# Patient Record
Sex: Female | Born: 1965
Health system: Southern US, Community
[De-identification: ages and names within clinical notes are randomized; demographics above are authoritative.]

## PROBLEM LIST (undated history)

## (undated) DIAGNOSIS — T7840XA Allergy, unspecified, initial encounter: Secondary | ICD-10-CM

## (undated) DIAGNOSIS — M1990 Unspecified osteoarthritis, unspecified site: Secondary | ICD-10-CM

## (undated) DIAGNOSIS — IMO0001 Reserved for inherently not codable concepts without codable children: Secondary | ICD-10-CM

## (undated) DIAGNOSIS — IMO0002 Reserved for concepts with insufficient information to code with codable children: Secondary | ICD-10-CM

## (undated) DIAGNOSIS — K219 Gastro-esophageal reflux disease without esophagitis: Secondary | ICD-10-CM

## (undated) DIAGNOSIS — K5900 Constipation, unspecified: Secondary | ICD-10-CM

## (undated) DIAGNOSIS — Z9289 Personal history of other medical treatment: Secondary | ICD-10-CM

## (undated) DIAGNOSIS — K649 Unspecified hemorrhoids: Secondary | ICD-10-CM

## (undated) DIAGNOSIS — E039 Hypothyroidism, unspecified: Secondary | ICD-10-CM

## (undated) HISTORY — DX: Allergy, unspecified, initial encounter: T78.40XA

## (undated) HISTORY — DX: Constipation, unspecified: K59.00

## (undated) HISTORY — DX: Reserved for concepts with insufficient information to code with codable children: IMO0002

## (undated) HISTORY — DX: Personal history of other medical treatment: Z92.89

## (undated) HISTORY — DX: Unspecified osteoarthritis, unspecified site: M19.90

## (undated) HISTORY — PX: POLYPECTOMY: SHX149

## (undated) HISTORY — DX: Unspecified hemorrhoids: K64.9

## (undated) HISTORY — PX: DILATION AND CURETTAGE OF UTERUS: SHX78

## (undated) HISTORY — PX: COLONOSCOPY: SHX174

## (undated) HISTORY — PX: VAGINAL HYSTERECTOMY: SUR661

## (undated) HISTORY — PX: TUBAL LIGATION: SHX77

---

## 1992-05-13 HISTORY — PX: BREAST SURGERY: SHX581

## 1993-05-13 HISTORY — PX: BREAST BIOPSY: SHX20

## 2000-05-13 DIAGNOSIS — IMO0002 Reserved for concepts with insufficient information to code with codable children: Secondary | ICD-10-CM

## 2000-05-13 DIAGNOSIS — R87619 Unspecified abnormal cytological findings in specimens from cervix uteri: Secondary | ICD-10-CM

## 2000-05-13 HISTORY — PX: LEEP: SHX91

## 2000-05-13 HISTORY — DX: Unspecified abnormal cytological findings in specimens from cervix uteri: R87.619

## 2000-05-13 HISTORY — PX: COLPOSCOPY: SHX161

## 2000-05-13 HISTORY — DX: Reserved for concepts with insufficient information to code with codable children: IMO0002

## 2006-07-09 ENCOUNTER — Encounter (INDEPENDENT_AMBULATORY_CARE_PROVIDER_SITE_OTHER): Payer: Self-pay | Admitting: *Deleted

## 2006-07-09 ENCOUNTER — Ambulatory Visit: Payer: Self-pay | Admitting: Obstetrics & Gynecology

## 2007-06-11 ENCOUNTER — Ambulatory Visit: Payer: Self-pay | Admitting: Gynecology

## 2007-07-15 ENCOUNTER — Encounter (INDEPENDENT_AMBULATORY_CARE_PROVIDER_SITE_OTHER): Payer: Self-pay | Admitting: Gynecology

## 2007-07-15 ENCOUNTER — Ambulatory Visit: Payer: Self-pay | Admitting: Obstetrics & Gynecology

## 2008-03-29 ENCOUNTER — Ambulatory Visit: Payer: Self-pay | Admitting: Nurse Practitioner

## 2008-03-29 ENCOUNTER — Encounter: Payer: Self-pay | Admitting: Obstetrics & Gynecology

## 2008-03-29 LAB — CONVERTED CEMR LAB
ALT: <8 units/L (ref 0–35)
AST: 12 units/L (ref 0–37)
Albumin: 4.4 g/dL (ref 3.5–5.2)
Alkaline Phosphatase: 57 units/L (ref 39–117)
Chlamydia, DNA Probe: NEGATIVE
GC Probe Amp, Genital: NEGATIVE
MCHC: 32.4 g/dL (ref 30.0–36.0)
Potassium: 4.1 meq/L (ref 3.5–5.3)
RDW: 13.1 % (ref 11.5–15.5)
Sodium: 141 meq/L (ref 135–145)
Total Protein: 7 g/dL (ref 6.0–8.3)

## 2008-03-30 ENCOUNTER — Encounter: Payer: Self-pay | Admitting: Obstetrics & Gynecology

## 2008-03-30 LAB — CONVERTED CEMR LAB
Clue Cells Wet Prep HPF POC: NONE SEEN
Trich, Wet Prep: NONE SEEN

## 2008-04-05 ENCOUNTER — Ambulatory Visit: Payer: Self-pay | Admitting: Nurse Practitioner

## 2008-08-02 ENCOUNTER — Ambulatory Visit: Payer: Self-pay | Admitting: Nurse Practitioner

## 2008-08-02 ENCOUNTER — Encounter: Payer: Self-pay | Admitting: Obstetrics & Gynecology

## 2008-08-02 LAB — CONVERTED CEMR LAB: FSH: 12.9 milliintl units/mL

## 2008-08-23 ENCOUNTER — Ambulatory Visit: Payer: Self-pay | Admitting: Nurse Practitioner

## 2009-10-19 ENCOUNTER — Ambulatory Visit: Payer: Self-pay | Admitting: Obstetrics and Gynecology

## 2010-06-27 ENCOUNTER — Other Ambulatory Visit: Payer: 59

## 2010-06-27 DIAGNOSIS — K649 Unspecified hemorrhoids: Secondary | ICD-10-CM

## 2010-09-25 NOTE — Assessment & Plan Note (Signed)
NAMESHAQUERA, [name]               ACCOUNT NO.:  [id number]   MEDICAL RECORD NO.:  [id number]          PATIENT TYPE:  POB   LOCATION:  [facility] at [facility]         FACILITY:  [facility]   PHYSICIAN:  Johnella Moloney, MD        DATE OF BIRTH:  [birth year]   DATE OF SERVICE:                                  CLINIC NOTE   REASON FOR VISIT:  The patient comes to the office today to follow up on  her insomnia.   HISTORY:  Since her last office visit, we did get the results of her FSH  and TSH back which were both normal.  She now has had a very thorough  workup as far as her blood work has gone and everything has been normal.  She feels that the Ambien has worked very well for her.  She has been  able to sleep through the night.  She is taking half a tablet.  She has  been having some multiple physical complaints and she realizes now that  these are related in part to her anxiety level.  Her anxiety is related  to her job which has become very stressful after her co-worker quit and  they have not hired anybody.  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  She is not getting any breaks and they  are setting more restrictions on her at work.  Things are going well for  her at home and she denies having any difficulties there.   ASSESSMENT:  Anxiety.   PLAN:  We had a rather lengthy conversation over 30 minutes.  We talked  about her sleep which is certainly improving.  We also talked about her  job.  We talked about exercise and starting a yoga program something  that she has used in the past that has worked well for her.  She will  also start on Zoloft 50 mg one-half tablet for 1 week and then up to 1  tablet daily, number 30 with 3 refills.  The patient is very motivated,  cooperating and getting things back under control.  She is potentially  seeing a counselor and/or life coach.  She will follow up in 3 months.      [name], NP    ______________________________  Johnella Moloney, MD    LR/MEDQ  D:  08/23/2008  T:  08/23/2008  Job:  [id number]

## 2010-09-25 NOTE — Assessment & Plan Note (Signed)
[name], [name]               ACCOUNT NO.:  [id number]   MEDICAL RECORD NO.:  [id number]          PATIENT TYPE:  POB   LOCATION:  [facility] at [facility]         FACILITY:  [facility]   PHYSICIAN:  Johnella Moloney, MD        DATE OF BIRTH:  September 02, 1965   DATE OF SERVICE:                                  CLINIC NOTE   The patient comes to the office today for followup on her genital  herpes.  She has significantly improved.  She is taking the Valtrex and  the pain medication as prescribed as well as the sitz bath.  She has  agreed that she would like to continue and do suppression.  She will be  given a prescription for Valtrex 500 mg, #30 with 11 refills.  We did  have Dr. Silas Flood examine the cyst that we noticed in her vagina.  She did  not feel that this was anything that she would do any medical  intervention on at this time.  She will be returning in March for her  Pap smear.  She will follow up if she has any problems between now and  then.      [name], NP    ______________________________  Johnella Moloney, MD    LR/MEDQ  D:  04/05/2008  T:  04/06/2008  Job:  [id number]

## 2010-09-25 NOTE — Assessment & Plan Note (Signed)
[name], [name]               ACCOUNT NO.:  [id number]   MEDICAL RECORD NO.:  [id number]          PATIENT TYPE:  POB   LOCATION:  [facility] at [facility]         FACILITY:  [facility]   PHYSICIAN:  Argentina Donovan, MD        DATE OF BIRTH:  02-27-1966   DATE OF SERVICE:  03/29/2008                                  CLINIC NOTE   The patient comes to office today complaining of vaginal pain as well as  vaginal itching.  The patient states her symptoms started on Saturday  with overall body aches and a feeling of fatigue.  She also had some  left lower back pain.  On Sunday, she noticed that she had some vaginal  itching and vaginal pain and that her vaginal area was swollen.  She has  not had any change in her sexual partner.  She and her husband have been  married for the last 12 years in a faithful relationship.  She was on  doxycycline at this time for an eye infection.  She has since September  had 3-4 infections that have been upper respiratory infection, flu, and  eye infection.  She felt that she has overall been in bad health since  then.  She did try some over-the-counter yeast medicine that actually  made things significantly worse.   PHYSICAL EXAMINATION:  GENERAL:  Well-developed, well-nourished, 45-year-  old Caucasian female in no acute distress.  VITAL SIGNS:  Blood pressure is 131/82, pulse is 67, weight is 160, and  height is 5 feet 8 inches.  VAGINAL EXAM:  Externally, the patient does not have any obvious lesion,  but she does have watching redness and excoriation over her right labia  majora.  She also has some tenderness.  There is also some redness.  She  is not able to locate a specific area that is more painful than the  other.  Vaginally on the right wall, there is an approximately 6-7  inches what appears to be a cyst.  She does have some bloody odorous  discharge.  Bimanual exam, there is no cervical motion tenderness.  There is no adnexal tenderness.   ASSESSMENT  AND PLAN:  The patient appears to have initial herpes  outbreak.   PLAN:  We will check her herpes culture, wet prep, GC, and Chlamydia.  She will also have blood drawn for HIV, herpes type 1 and 2, acute  hepatitis, RPR, CBC with differential, and CMET.  She will also be given  Valtrex 1 g b.i.d. for 10 days.  She will be given Darvocet-N 100 to  take 1 or 2 p.o. q.6 h. p.r.n. #30.  She was given a note to be off work  until April 04, 2008.  We did have a lengthy discussion concerning  comfort measures including sitz baths and clean.  She will follow up on  April 05, 2008.  We will discuss her labs at that time.  She can call  the office in the meantime if she is experiencing any significant  problems.      [name], NP    ______________________________  Argentina Donovan, MD    LR/MEDQ  D:  03/29/2008  T:  03/30/2008  Job:  [id number]

## 2010-09-25 NOTE — Assessment & Plan Note (Signed)
[name], [name]               ACCOUNT NO.:  [id number]   MEDICAL RECORD NO.:  [id number]          PATIENT TYPE:  POB   LOCATION:  [facility]         FACILITY:  [facility]   PHYSICIAN:  [name], NP   DATE OF BIRTH:  22-Jun-1965   DATE OF SERVICE:  08/02/2008                                  CLINIC NOTE   The patient is in the office today for her yearly exam.  The patient was  diagnosed in this past year with herpes.  She had an Regional outbreak  back in November 17.  She did get Valtrex and is currently on  prophylaxis.  She is doing well with that.  She is also having some  complaints about her menstrual cycle changing.  Her menstrual period is  now currently only 1 day.  In very light, she does state that her mother  went through menopause in her 70s.  The patient is also complaining of  fatigue and difficulty sleeping.  She has no problem falling asleep, but  she does not stay asleep.  She may wake up after 1 hour or wake up in  middle of the night and be up for 2-3 hours.  She feels very restless.  The patient denies any psychological stressors.  She states that  everything is fine at home and at work.  The patient also is complaining  of weight gain.  She in fact has lost weight has lost 3 pounds since her  November visit.  She complains of no energy as being cold and having a  low libido.  Her sister has thyroid problems.  Her father has non-  insulin-dependent diabetes and she would like to have these things  checked.  At her visit in which she was diagnosed with herpes, we did do  chlamydia, gonorrhea, which were negative.  We did a CBC which was  negative, Chem 20 which was negative, hepatitis negative, HIV negative,  RPR negative.  The patient's last mammogram was July 18, 2006.   ALLERGIES:  The patient is allergic to Cipro and sulfa.   LMP first day of the last menstrual period is July 05, 2008.  Last  Pap smear was July 15, 2007.   PHYSICAL  EXAMINATION:  VITAL SIGNS:  Blood pressure 129/77, pulse is 65,  weight is 157, height is 5 feet 8 inches.  GENERAL:  Well-developed, well-nourished, 45 year old Caucasian female,  in no acute distress.  Emotions, the patient seems logical and well  organized.  NEUROLOGICALLY:  Muscles and nerves appear intact.  THYROID:  No nodules or enlargement.  CARDIAC:  Regular rate and rhythm.  LUNGS:  Clear bilaterally.  ABDOMEN:  Soft, nontender.  BREASTS:  Symmetrical.  No skin dimpling.  No mass.  PELVIC:  Externally, there are no lesions and discharges.  Internally,  the vaginal vault is filled with blood.  Cervix is closed without  lesions.  Bimanual exam, there is no cervical motion tenderness.  EXTREMITIES:  Warm and dry.   ASSESSMENT:  1. Yearly Pap and pelvic.  The patient is on the first day of her      menstrual cycle.  The patient has had  a tubal ligation for her      birth control.  2. Fatigue.  We will check an FSH and TSH.  3. Insomnia.  We did have a lengthy discussion concerning sleep      hygiene.  The patient will be given a prescription for Ambien 10 mg      1-1/2 tablet to 1 tablet nightly p.r.n. sleep #20 with 2 refills.  4. Breast health, the patient is scheduled today for a mammogram.  The      patient will follow up in 3 weeks for the insomnia.  She can call      the office sooner if need be.      [name], NP     LR/MEDQ  D:  08/02/2008  T:  08/03/2008  Job:  [id number]

## 2010-09-25 NOTE — Assessment & Plan Note (Signed)
[name], [name]               ACCOUNT NO.:  [id number]   MEDICAL RECORD NO.:  [id number]          PATIENT TYPE:  POB   LOCATION:  [facility] at [facility]         FACILITY:  [facility]   PHYSICIAN:  Argentina Donovan, MD        DATE OF BIRTH:  1965-06-27   DATE OF SERVICE:  10/19/2009                                  CLINIC NOTE   CHIEF COMPLAINT:  Annual physical.   HISTORY OF PRESENT ILLNESS:  The patient is a 45 year old white female  in good health who was only on Ambien on a regular basis each night  because of insomnia in the past.  The medication she was on in the past,  the Valtrex and the Zoloft have been stopped since her stress level at  the job is going down significantly and she seems to be in good health.  She exercises regularly and has a healthy lifestyle.   REVIEW OF SYSTEMS:  Essentially negative, exception of occasional  palpitation not at exercise, but at unexpected times.   ALLERGIES:  The patient is allergic to SULFA and CIPRO.   She has had 3 normal Pap smears within the last 3 years and is up-to-  date on her mammogram.   FAMILY HISTORY:  She has no family history of breast cancer.   PHYSICAL EXAMINATION:  VITAL SIGNS:  Blood pressure 124/79.  The  patient's weight is 158.  She is 5 feet 8 inches tall.  Pulse is 55 per  minute.  GENERAL:  Well-developed, well-nourished white female in no acute  distress.  HEENT:  Normocephalic and atraumatic within normal limits.  NECK:  Supple.  Thyroid is symmetrical.  No masses.  BACK:  Erect.  LUNGS:  Clear to auscultation and percussion.  HEART:  No murmur, but a significant third sound click over the area of  the mitral valve.  PMI at the fifth ICS and MCL.  ABDOMEN:  Soft, flat, nontender.  No masses.  No organomegaly.  PELVIC:  External genitalia is normal.  BUS within normal limits.  Vagina is clean and well rugated.  The cervix is clean and parous.  Uterus; anterior, normal size, shape, consistency, and the adnexa  is  normal.  RECTAL:  No masses.  EXTREMITIES:  Negative for edema and varicosities.  NEUROLOGIC:  DTRs within normal limits.   IMPRESSION:  Normal physical examination with exception of cardiac  click.  I am going to recommend that she gets a cardiac ultrasound,  possible mitral valve prolapse, virtually asymptomatic.  Rx for Ambien  30 with renewal p.r.n. for a year.           ______________________________  Argentina Donovan, MD     PR/MEDQ  D:  10/19/2009  T:  10/19/2009  Job:  [id number]

## 2011-04-15 ENCOUNTER — Encounter: Payer: Self-pay | Admitting: Obstetrics & Gynecology

## 2011-04-15 ENCOUNTER — Encounter: Payer: Self-pay | Admitting: Gynecology

## 2011-04-15 ENCOUNTER — Ambulatory Visit (INDEPENDENT_AMBULATORY_CARE_PROVIDER_SITE_OTHER): Payer: 59 | Admitting: Obstetrics & Gynecology

## 2011-04-15 VITALS — BP 116/71 | HR 67 | Ht 67.0 in | Wt 144.0 lb

## 2011-04-15 DIAGNOSIS — Z0000 Encounter for general adult medical examination without abnormal findings: Secondary | ICD-10-CM

## 2011-04-15 DIAGNOSIS — Z01419 Encounter for gynecological examination (general) (routine) without abnormal findings: Secondary | ICD-10-CM

## 2011-04-15 DIAGNOSIS — Z1272 Encounter for screening for malignant neoplasm of vagina: Secondary | ICD-10-CM

## 2011-04-15 MED ORDER — ZOLPIDEM TARTRATE 10 MG PO TABS: 10.0000 mg | ORAL_TABLET | Freq: Every evening | ORAL | Status: DC | PRN

## 2011-04-15 NOTE — Progress Notes (Signed)
Subjective:    [name] is a 45 y.o. female who presents for an annual exam. The patient has no complaints today. She would like a refill on her [place]. The patient is sexually active. GYN screening history: last pap: was normal. The patient wears seatbelts: yes. The patient participates in regular exercise: yes. (zumba and spin) Has the patient ever been transfused or tattooed?: no. The patient reports that there is not domestic violence in her life.   Menstrual History: OB History    Grav Para Term Preterm Abortions TAB SAB Ect Mult Living   3 2   1     2       Menarche age: 5 Patient's last menstrual period was 04/06/2011.    The following portions of the patient's history were reviewed and updated as appropriate: allergies, current medications, past family history, past medical history, past social history, past surgical history and problem list.  Review of Systems A comprehensive review of systems was negative. She has been monogamous since 1997 and married for about 7 years. She is the senior teller at a bank. Her libido has decreased in the last several years.   Objective:    BP 116/71  Pulse 67  Ht 5\' 7"  (1.702 m)  Wt 144 lb (65.318 kg)  BMI 22.55 kg/m2  LMP 04/06/2011  General Appearance:    Alert, cooperative, no distress, appears stated age  Head:    Normocephalic, without obvious abnormality, atraumatic  Eyes:    PERRL, conjunctiva/corneas clear, EOM's intact, fundi    benign, both eyes  Ears:    Normal TM's and external ear canals, both ears  Nose:   Nares normal, septum midline, mucosa normal, no drainage    or sinus tenderness  Throat:   Lips, mucosa, and tongue normal; teeth and gums normal  Neck:   Supple, symmetrical, trachea midline, no adenopathy;    thyroid:  no enlargement/tenderness/nodules; no carotid   bruit or JVD  Back:     Symmetric, no curvature, ROM normal, no CVA tenderness  Lungs:     Clear to auscultation bilaterally, respirations unlabored   Chest Wall:    No tenderness or deformity   Heart:    Regular rate and rhythm, S1 and S2 normal, no murmur, rub   or gallop  Breast Exam:    No tenderness, masses, or nipple abnormality  Abdomen:     Soft, non-tender, bowel sounds active all four quadrants,    no masses, no organomegaly  Genitalia:    Normal female without lesion, discharge or tenderness, NSSA, NT, no adnexal masses     Extremities:   Extremities normal, atraumatic, no cyanosis or edema  Pulses:   2+ and symmetric all extremities  Skin:   Skin color, texture, turgor normal, no rashes or lesions  Lymph nodes:   Cervical, supraclavicular, and axillary nodes normal  Neurologic:   CNII-XII intact, normal strength, sensation and reflexes    throughout  .    Assessment:    Healthy female exam.    Plan:     Await pap smear results. Mammogram.  She will come back fasting for labs.

## 2011-04-17 ENCOUNTER — Other Ambulatory Visit: Payer: Self-pay | Admitting: Nurse Practitioner

## 2011-04-19 LAB — PAP IG W/ RFLX HPV ASCU

## 2011-04-22 ENCOUNTER — Other Ambulatory Visit (INDEPENDENT_AMBULATORY_CARE_PROVIDER_SITE_OTHER): Payer: 59 | Admitting: *Deleted

## 2011-04-22 DIAGNOSIS — F411 Generalized anxiety disorder: Secondary | ICD-10-CM

## 2011-04-22 DIAGNOSIS — Z01419 Encounter for gynecological examination (general) (routine) without abnormal findings: Secondary | ICD-10-CM

## 2011-04-22 DIAGNOSIS — F419 Anxiety disorder, unspecified: Secondary | ICD-10-CM

## 2011-04-22 LAB — CBC WITH DIFFERENTIAL/PLATELET
Eosinophils Absolute: 0.2 10*3/uL (ref 0.0–0.7)
Eosinophils Relative: 3 % (ref 0–5)
HCT: 41 % (ref 36.0–46.0)
Lymphocytes Relative: 22 % (ref 12–46)
Lymphs Abs: 1.3 10*3/uL (ref 0.7–4.0)
MCH: 29.6 pg (ref 26.0–34.0)
MCV: 91.1 fL (ref 78.0–100.0)
Monocytes Absolute: 0.4 10*3/uL (ref 0.1–1.0)
Platelets: 202 10*3/uL (ref 150–400)
RBC: 4.5 MIL/uL (ref 3.87–5.11)
RDW: 13.6 % (ref 11.5–15.5)
WBC: 5.7 10*3/uL (ref 4.0–10.5)

## 2011-04-22 MED ORDER — ZOLPIDEM TARTRATE 10 MG PO TABS: 10.0000 mg | ORAL_TABLET | Freq: Every evening | ORAL | Status: DC | PRN

## 2011-04-23 LAB — COMPREHENSIVE METABOLIC PANEL
ALT: <8 U/L (ref 0–35)
Alkaline Phosphatase: 50 U/L (ref 39–117)
Creat: 0.84 mg/dL (ref 0.50–1.10)
Sodium: 138 mEq/L (ref 135–145)
Total Bilirubin: 0.5 mg/dL (ref 0.3–1.2)
Total Protein: 6.7 g/dL (ref 6.0–8.3)

## 2011-04-23 LAB — LIPID PANEL
HDL: 49 mg/dL (ref >39)
LDL Cholesterol: 101 mg/dL — ABNORMAL HIGH (ref 0–99)
Total CHOL/HDL Ratio: 3.4 Ratio
Triglycerides: 75 mg/dL (ref <150)
VLDL: 15 mg/dL (ref 0–40)

## 2011-05-14 HISTORY — PX: COLONOSCOPY W/ BIOPSIES: SHX1374

## 2011-06-04 ENCOUNTER — Ambulatory Visit (INDEPENDENT_AMBULATORY_CARE_PROVIDER_SITE_OTHER): Payer: 59 | Admitting: Obstetrics and Gynecology

## 2011-06-04 ENCOUNTER — Encounter: Payer: Self-pay | Admitting: Internal Medicine

## 2011-06-04 VITALS — BP 130/70 | HR 61 | Ht 67.0 in | Wt 144.0 lb

## 2011-06-04 DIAGNOSIS — K649 Unspecified hemorrhoids: Secondary | ICD-10-CM

## 2011-06-04 NOTE — Progress Notes (Signed)
Patient presents today as a follow-up appointment. Patient reports that for the past week she has noticed bloody stools. Patient states that she eats a high fiber diet but has irregular bowel movements- she can go every day for a week and then not go for a week. She does not feel that she is constipated and her bowel movements are not painful.  Physical exam: Small 0.5 cm hemorrhoid visualized at the 6 o/clock position. No fissures, No active bleeding, No blood on glove  A/P 46 yo G3P2012 with small hemorrhoid - Referral to GI provided - patient advised to continue high fiber diet and to use preparation H for relief until seen by GI

## 2011-06-04 NOTE — Patient Instructions (Signed)

## 2011-06-07 ENCOUNTER — Encounter: Payer: Self-pay | Admitting: Internal Medicine

## 2011-06-13 ENCOUNTER — Ambulatory Visit (INDEPENDENT_AMBULATORY_CARE_PROVIDER_SITE_OTHER): Payer: 59 | Admitting: Internal Medicine

## 2011-06-13 ENCOUNTER — Encounter: Payer: Self-pay | Admitting: Internal Medicine

## 2011-06-13 VITALS — BP 110/60 | HR 60 | Ht 67.0 in | Wt 145.0 lb

## 2011-06-13 DIAGNOSIS — K625 Hemorrhage of anus and rectum: Secondary | ICD-10-CM

## 2011-06-13 DIAGNOSIS — K589 Irritable bowel syndrome without diarrhea: Secondary | ICD-10-CM

## 2011-06-13 DIAGNOSIS — K648 Other hemorrhoids: Secondary | ICD-10-CM

## 2011-06-13 MED ORDER — HYDROCORTISONE ACE-PRAMOXINE 2.5-1 % RE CREA: TOPICAL_CREAM | Freq: Three times a day (TID) | RECTAL | Status: AC

## 2011-06-13 MED ORDER — ALIGN 4 MG PO CAPS: 1.0000 | ORAL_CAPSULE | Freq: Every day | ORAL | Status: AC

## 2011-06-13 MED ORDER — PEG-KCL-NACL-NASULF-NA ASC-C 100 G PO SOLR: 1.0000 | Freq: Once | ORAL | Status: DC

## 2011-06-13 NOTE — Progress Notes (Signed)
Subjective:    Patient ID: [name], female    DOB: April 04, 1966, 46 y.o.   MRN: [id number]  HPI [name] is a 46 yo female with little PMH who is seen in consultation at the request of Dr. Jolayne Panther for evaluation of rectal bleeding and internal hemorrhoids.  [name]  reports ongoing trouble with bright red blood per rectum. This started a few weeks ago, but recently has been present with every bowel movement. Overall this bleeding is painless, and always associated with bowel movement. She reports seeing bright red blood in the toilet water and on the toilet tissue. She has not felt an external hemorrhoid.  She reports having recently had a rectal exam Dr. Jolayne Panther which revealed an internal hemorrhoid.  She reports long-standing alternating diarrhea and constipation, though constipation seems to predominate. She reports she will have 2-3 days and around loose stools followed by one to 2 weeks of constipation. During her constipated. She reports having a bowel movement 2-3 times per week. These bowel movements can be large and hard, and require straining. She reports some associated lower abdominal cramping which is relieved with defecation. Also some associated nausea but no vomiting. She denies melena. No other upper GI complaints including no heartburn, dysphagia, odynophagia. No early satiety or weight loss. She is unable to relate her bowel habits to any specific dietary intake.  No fevers or chills. No night sweats.  Review of Systems Constitutional: Negative for fever, chills, night sweats, activity change, appetite change and unexpected weight change HEENT: Negative for sore throat, mouth sores and trouble swallowing. Eyes: Negative for visual disturbance Respiratory: Negative for cough, chest tightness and shortness of breath Cardiovascular: Negative for chest pain, palpitations and lower extremity swelling Gastrointestinal: See history of present illness Genitourinary: Negative for  dysuria and hematuria. Musculoskeletal: Positive for back pain, negative for arthralgias and myalgias Skin: Negative for rash or color change Neurological: Positive for occasional headaches, negative for weakness, numbness Hematological: Negative for adenopathy, negative for easy bruising/bleeding Psychiatric/behavioral: Negative for depressed mood, negative for anxiety  Past Medical History  Diagnosis Date  . Anxiety   . Abnormal Pap smear 2002    LGSIL  . Hemorrhoid    Past Surgical History  Procedure Date  . Tubal ligation   . Breast surgery 1994    left breast bx  . Leep 2002  . Dilation and curettage of uterus     TAB  . Colposcopy 2002    ABNORMAL PAP   Meds:  Ambien 5 mg each bedtime  Allergies  Allergen Reactions  . Ciprofloxacin   . Sulfa Antibiotics    Family History  Problem Relation Age of Onset  . Hypertension Father   . Diabetes Father   . Stroke Maternal Grandfather   . Diabetes Paternal Aunt   . Heart disease Paternal Aunt   . Diabetes Paternal Uncle   . Heart disease Paternal Uncle   -Neg CRC/IBD  Social History  . Marital Status: Married    Number of Children: 2   Occupational History  . bank teller    Social History Main Topics  . Smoking status: Never Smoker   . Smokeless tobacco: Never Used  . Alcohol Use: No  . Drug Use: No  . Sexually Active: Yes -- Female partner(s)      Objective:   Physical Exam BP 110/60  Pulse 60  Ht 5\' 7"  (1.702 m)  Wt 145 lb (65.772 kg)  BMI 22.71 kg/m2  LMP 05/28/2011 Constitutional:  Well-developed and well-nourished. No distress. HEENT: Normocephalic and atraumatic. Oropharynx is clear and moist. No oropharyngeal exudate. Conjunctivae are normal. Pupils are equal round and reactive to light. No scleral icterus. Neck: Neck supple. Trachea midline. Cardiovascular: Normal rate, regular rhythm and intact distal pulses. No M/R/G Pulmonary/chest: Effort normal and breath sounds normal. No wheezing, rales  or rhonchi. Abdominal: Soft, nontender, nondistended. Bowel sounds active throughout. There are no masses palpable. No hepatosplenomegaly. Extremities: no clubbing, cyanosis, or edema Lymphadenopathy: No cervical adenopathy noted. Neurological: Alert and oriented to person place and time. Skin: Skin is warm and dry. No rashes noted. Psychiatric: Normal mood and affect. Behavior is normal.  CBC    Component Value Date/Time   WBC 5.7 04/22/2011 1516   RBC 4.50 04/22/2011 1516   HGB 13.3 04/22/2011 1516   HCT 41.0 04/22/2011 1516   PLT 202 04/22/2011 1516   MCV 91.1 04/22/2011 1516   MCH 29.6 04/22/2011 1516   MCHC 32.4 04/22/2011 1516   RDW 13.6 04/22/2011 1516   LYMPHSABS 1.3 04/22/2011 1516   MONOABS 0.4 04/22/2011 1516   EOSABS 0.2 04/22/2011 1516   BASOSABS 0.1 04/22/2011 1516   CMP     Component Value Date/Time   NA 138 04/22/2011 1516   K 4.4 04/22/2011 1516   CL 105 04/22/2011 1516   CO2 22 04/22/2011 1516   GLUCOSE 73 04/22/2011 1516   BUN 9 04/22/2011 1516   CREATININE 0.84 04/22/2011 1516   CREATININE 0.84 03/29/2008 2304   CALCIUM 8.9 04/22/2011 1516   PROT 6.7 04/22/2011 1516   ALBUMIN 4.3 04/22/2011 1516   AST 17 04/22/2011 1516   ALT <8 04/22/2011 1516   ALKPHOS 50 04/22/2011 1516   BILITOT 0.5 04/22/2011 1516      Assessment & Plan:  46 yo female with little PMH who is seen in consultation at the request of Dr. Jolayne Panther for evaluation of rectal bleeding and internal hemorrhoids.  1. Rectal bleeding/internal hemorrhoids/IBS -- the patient is having ongoing rectal bleeding, recently CBC did not reveal anemia, which is reassuring. We have discussed this bleeding a day, and certainly this could be related to hemorrhoidal disease. However given her age and rectal bleeding, I recommended colonoscopy. She is agreeable to proceed. We discussed the risks and benefits of this test. I will give her a prescription for Analpram to be used for her internal hemorrhoid,  as this was recently seen on rectal exam by her GYN doctor.  Finally, her alternating bowel habits, which are long-standing over many years, are most consistent with irritable bowel syndrome.  We have discussed this today, and I will prescribe Align one capsule daily to try to regulate her digestion and bowel habits. We have discussed trying to avoid constipation, as this can worsen hemorrhoidal disease.  Further recommendations can be made after colonoscopy.

## 2011-06-13 NOTE — Patient Instructions (Signed)
You have been scheduled for a colonoscopy. Please follow written instructions given to you at your visit today.  Please pick up your prep kit at the pharmacy within the next 2-3 days.  We have sent the following medications to your pharmacy for you to pick up at your convenience: align; which you have been given samples of. Analpram, moviprep

## 2011-06-14 ENCOUNTER — Telehealth: Payer: Self-pay | Admitting: Gastroenterology

## 2011-06-14 NOTE — Telephone Encounter (Signed)
Left a voicemail for the Neriah to call me back after I received a call from her pharmacy saying she was there wanting to know if there was any other kind of prep for her to take for her Colonoscopy because Moviprep was going to cost her $50.00, after talking to Dr. Rhea Belton he said I could have her use suprep or prepopik.

## 2011-06-25 ENCOUNTER — Encounter: Payer: Self-pay | Admitting: Internal Medicine

## 2011-06-25 ENCOUNTER — Ambulatory Visit (AMBULATORY_SURGERY_CENTER): Payer: 59 | Admitting: Internal Medicine

## 2011-06-25 DIAGNOSIS — D126 Benign neoplasm of colon, unspecified: Secondary | ICD-10-CM

## 2011-06-25 DIAGNOSIS — K625 Hemorrhage of anus and rectum: Secondary | ICD-10-CM

## 2011-06-25 DIAGNOSIS — K648 Other hemorrhoids: Secondary | ICD-10-CM

## 2011-06-25 DIAGNOSIS — K589 Irritable bowel syndrome without diarrhea: Secondary | ICD-10-CM

## 2011-06-25 DIAGNOSIS — K649 Unspecified hemorrhoids: Secondary | ICD-10-CM

## 2011-06-25 MED ORDER — SODIUM CHLORIDE 0.9 % IV SOLN: 500.0000 mL | INTRAVENOUS | Status: DC

## 2011-06-25 NOTE — Patient Instructions (Addendum)
FOLLOW DISCHARGE INSTRUCTIONS (BLUE AND GREEN SHEETS).. Follow up with Dr. Rhea Belton in one month in office.

## 2011-06-25 NOTE — Progress Notes (Signed)
Patient did not experience any of the following events: a burn prior to discharge; a fall within the facility; wrong site/side/patient/procedure/implant event; or a hospital transfer or hospital admission upon discharge from the facility. (G8907) Patient did not have preoperative order for IV antibiotic SSI prophylaxis. (G8918)  

## 2011-06-25 NOTE — Op Note (Signed)
[facility] [address] [place], [place]  [address]  COLONOSCOPY PROCEDURE REPORT  PATIENT:  Claudia, Garcia  MR#:  [id number] BIRTHDATE:  02-11-1966, 46 yrs. old  GENDER:  female ENDOSCOPIST:  Carie Caddy. Lenah Messenger, MD REF. BY:  Dr. Jolayne Panther, MD PROCEDURE DATE:  06/25/2011 PROCEDURE:  Colonoscopy with snare polypectomy ASA CLASS:  Class I INDICATIONS:  rectal bleeding MEDICATIONS:   MAC sedation, administered by CRNA, propofol (Diprivan) 320 mg IV  DESCRIPTION OF PROCEDURE:   After the risks benefits and alternatives of the procedure were thoroughly explained, informed consent was obtained.  Digital rectal exam was performed and revealed no rectal masses and perianal skin tag.   The LB 180AL [id number] endoscope was introduced through the anus and advanced to the terminal ileum which was intubated for a short distance, without limitations.  The quality of the prep was good, using MoviPrep.  The instrument was then slowly withdrawn as the colon was fully examined. <<PROCEDUREIMAGES>>  FINDINGS:  A 7 mm flat and sessile polyp was found at the hepatic flexure. Polyps were snared without cautery. Retrieval was successful.  Mild diverticulosis was found in the sigmoid colon. Internal Hemorrhoids were found.   Retroflexed views in the rectum revealed no other findings other than those already described. The scope was then withdrawn from the cecum and the procedure completed.  COMPLICATIONS:  None ENDOSCOPIC IMPRESSION: 1) Sessile polyp at the hepatic flexure. Removed and sent to pathology. 2) Mild diverticulosis in the sigmoid colon 3) Internal hemorrhoids  RECOMMENDATIONS: 1) Hold aspirin, aspirin products, and anti-inflammatory medication for 1 week. 2) Await pathology results 3) High fiber diet. 4) If the polyp removed today is proven to be adenomatous (pre-cancerous) polyps, you will need a repeat colonoscopy in 5 years. Otherwise you should continue to follow  colorectal cancer screening guidelines for "routine risk" patients with colonoscopy in 10 years. 5) You will receive a letter within 1-2 weeks with the results of your biopsy as well as final recommendations. Please call my office if you have not received a letter after 3 weeks.  Carie Caddy. Kaladin Noseworthy, MD  CC:  The Patient Dr. Jolayne Panther  n. eSIGNED:   Carie Caddy. Ednah Hammock at 06/25/2011 02:13 PM  Hyman Bower, [phone]

## 2011-06-26 ENCOUNTER — Telehealth: Payer: Self-pay | Admitting: *Deleted

## 2011-06-26 NOTE — Telephone Encounter (Signed)
Left message to call as needed. 

## 2011-07-02 ENCOUNTER — Encounter: Payer: Self-pay | Admitting: Internal Medicine

## 2012-04-21 ENCOUNTER — Ambulatory Visit (INDEPENDENT_AMBULATORY_CARE_PROVIDER_SITE_OTHER): Payer: 59 | Admitting: Obstetrics & Gynecology

## 2012-04-21 ENCOUNTER — Encounter: Payer: Self-pay | Admitting: Obstetrics & Gynecology

## 2012-04-21 VITALS — BP 110/69 | HR 63 | Ht 67.0 in | Wt 153.0 lb

## 2012-04-21 DIAGNOSIS — Z01419 Encounter for gynecological examination (general) (routine) without abnormal findings: Secondary | ICD-10-CM

## 2012-04-21 DIAGNOSIS — Z124 Encounter for screening for malignant neoplasm of cervix: Secondary | ICD-10-CM

## 2012-04-21 DIAGNOSIS — G4700 Insomnia, unspecified: Secondary | ICD-10-CM

## 2012-04-21 DIAGNOSIS — Z1151 Encounter for screening for human papillomavirus (HPV): Secondary | ICD-10-CM

## 2012-04-21 DIAGNOSIS — N898 Other specified noninflammatory disorders of vagina: Secondary | ICD-10-CM

## 2012-04-21 MED ORDER — ZOLPIDEM TARTRATE 5 MG PO TABS: 5.0000 mg | ORAL_TABLET | Freq: Every evening | ORAL | Status: DC | PRN

## 2012-04-21 NOTE — Patient Instructions (Signed)
Preventive Care for Adults, Female A healthy lifestyle and preventive care can promote health and wellness. Preventive health guidelines for women include the following key practices.  A routine yearly physical is a good way to check with your caregiver about your health and preventive screening. It is a chance to share any concerns and updates on your health, and to receive a thorough exam.  Visit your dentist for a routine exam and preventive care every 6 months. Brush your teeth twice a day and floss once a day. Good oral hygiene prevents tooth decay and gum disease.  The frequency of eye exams is based on your age, health, family medical history, use of contact lenses, and other factors. Follow your caregiver's recommendations for frequency of eye exams.  Eat a healthy diet. Foods like vegetables, fruits, whole grains, low-fat dairy products, and lean protein foods contain the nutrients you need without too many calories. Decrease your intake of foods high in solid fats, added sugars, and salt. Eat the right amount of calories for you.Get information about a proper diet from your caregiver, if necessary.  Regular physical exercise is one of the most important things you can do for your health. Most adults should get at least 150 minutes of moderate-intensity exercise (any activity that increases your heart rate and causes you to sweat) each week. In addition, most adults need muscle-strengthening exercises on 2 or more days a week.  Maintain a healthy weight. The body mass index (BMI) is a screening tool to identify possible weight problems. It provides an estimate of body fat based on height and weight. Your caregiver can help determine your BMI, and can help you achieve or maintain a healthy weight.For adults 20 years and older:  A BMI below 18.5 is considered underweight.  A BMI of 18.5 to 24.9 is normal.  A BMI of 25 to 29.9 is considered overweight.  A BMI of 30 and above is  considered obese.  Maintain normal blood lipids and cholesterol levels by exercising and minimizing your intake of saturated fat. Eat a balanced diet with plenty of fruit and vegetables. Blood tests for lipids and cholesterol should begin at age 41 and be repeated every 5 years. If your lipid or cholesterol levels are high, you are over 50, or you are at high risk for heart disease, you may need your cholesterol levels checked more frequently.Ongoing high lipid and cholesterol levels should be treated with medicines if diet and exercise are not effective.  If you smoke, find out from your caregiver how to quit. If you do not use tobacco, do not start.  If you are pregnant, do not drink alcohol. If you are breastfeeding, be very cautious about drinking alcohol. If you are not pregnant and choose to drink alcohol, do not exceed 1 drink per day. One drink is considered to be 12 ounces (355 mL) of beer, 5 ounces (148 mL) of wine, or 1.5 ounces (44 mL) of liquor.  Avoid use of street drugs. Do not share needles with anyone. Ask for help if you need support or instructions about stopping the use of drugs.  High blood pressure causes heart disease and increases the risk of stroke. Your blood pressure should be checked at least every 1 to 2 years. Ongoing high blood pressure should be treated with medicines if weight loss and exercise are not effective.  If you are 65 to 47 years old, ask your caregiver if you should take aspirin to prevent strokes.  Diabetes  screening involves taking a blood sample to check your fasting blood sugar level. This should be done once every 3 years, after age 45, if you are within normal weight and without risk factors for diabetes. Testing should be considered at a younger age or be carried out more frequently if you are overweight and have at least 1 risk factor for diabetes.  Breast cancer screening is essential preventive care for women. You should practice "breast  self-awareness." This means understanding the normal appearance and feel of your breasts and may include breast self-examination. Any changes detected, no matter how small, should be reported to a caregiver. Women in their 20s and 30s should have a clinical breast exam (CBE) by a caregiver as part of a regular health exam every 1 to 3 years. After age 40, women should have a CBE every year. Starting at age 40, women should consider having a mammography (breast X-ray test) every year. Women who have a family history of breast cancer should talk to their caregiver about genetic screening. Women at a high risk of breast cancer should talk to their caregivers about having magnetic resonance imaging (MRI) and a mammography every year.  The Pap test is a screening test for cervical cancer. A Pap test can show cell changes on the cervix that might become cervical cancer if left untreated. A Pap test is a procedure in which cells are obtained and examined from the lower end of the uterus (cervix).  Women should have a Pap test starting at age 21.  Between ages 21 and 29, Pap tests should be repeated every 2 years.  Beginning at age 30, you should have a Pap test every 3 years as long as the past 3 Pap tests have been normal.  Some women have medical problems that increase the chance of getting cervical cancer. Talk to your caregiver about these problems. It is especially important to talk to your caregiver if a new problem develops soon after your last Pap test. In these cases, your caregiver may recommend more frequent screening and Pap tests.  The above recommendations are the same for women who have or have not gotten the vaccine for human papillomavirus (HPV).  If you had a hysterectomy for a problem that was not cancer or a condition that could lead to cancer, then you no longer need Pap tests. Even if you no longer need a Pap test, a regular exam is a good idea to make sure no other problems are  starting.  If you are between ages 65 and 70, and you have had normal Pap tests going back 10 years, you no longer need Pap tests. Even if you no longer need a Pap test, a regular exam is a good idea to make sure no other problems are starting.  If you have had past treatment for cervical cancer or a condition that could lead to cancer, you need Pap tests and screening for cancer for at least 20 years after your treatment.  If Pap tests have been discontinued, risk factors (such as a new sexual partner) need to be reassessed to determine if screening should be resumed.  The HPV test is an additional test that may be used for cervical cancer screening. The HPV test looks for the virus that can cause the cell changes on the cervix. The cells collected during the Pap test can be tested for HPV. The HPV test could be used to screen women aged 30 years and older, and should   be used in women of any age who have unclear Pap test results. After the age of 30, women should have HPV testing at the same frequency as a Pap test.  Colorectal cancer can be detected and often prevented. Most routine colorectal cancer screening begins at the age of 50 and continues through age 75. However, your caregiver may recommend screening at an earlier age if you have risk factors for colon cancer. On a yearly basis, your caregiver may provide home test kits to check for hidden blood in the stool. Use of a small camera at the end of a tube, to directly examine the colon (sigmoidoscopy or colonoscopy), can detect the earliest forms of colorectal cancer. Talk to your caregiver about this at age 50, when routine screening begins. Direct examination of the colon should be repeated every 5 to 10 years through age 75, unless early forms of pre-cancerous polyps or small growths are found.  Hepatitis C blood testing is recommended for all people born from 1945 through 1965 and any individual with known risks for hepatitis C.  Practice  safe sex. Use condoms and avoid high-risk sexual practices to reduce the spread of sexually transmitted infections (STIs). STIs include gonorrhea, chlamydia, syphilis, trichomonas, herpes, HPV, and human immunodeficiency virus (HIV). Herpes, HIV, and HPV are viral illnesses that have no cure. They can result in disability, cancer, and death. Sexually active women aged 25 and younger should be checked for chlamydia. Older women with new or multiple partners should also be tested for chlamydia. Testing for other STIs is recommended if you are sexually active and at increased risk.  Osteoporosis is a disease in which the bones lose minerals and strength with aging. This can result in serious bone fractures. The risk of osteoporosis can be identified using a bone density scan. Women ages 65 and over and women at risk for fractures or osteoporosis should discuss screening with their caregivers. Ask your caregiver whether you should take a calcium supplement or vitamin D to reduce the rate of osteoporosis.  Menopause can be associated with physical symptoms and risks. Hormone replacement therapy is available to decrease symptoms and risks. You should talk to your caregiver about whether hormone replacement therapy is right for you.  Use sunscreen with sun protection factor (SPF) of 30 or more. Apply sunscreen liberally and repeatedly throughout the day. You should seek shade when your shadow is shorter than you. Protect yourself by wearing long sleeves, pants, a wide-brimmed hat, and sunglasses year round, whenever you are outdoors.  Once a month, do a whole body skin exam, using a mirror to look at the skin on your back. Notify your caregiver of new moles, moles that have irregular borders, moles that are larger than a pencil eraser, or moles that have changed in shape or color.  Stay current with required immunizations.  Influenza. You need a dose every fall (or winter). The composition of the flu vaccine  changes each year, so being vaccinated once is not enough.  Pneumococcal polysaccharide. You need 1 to 2 doses if you smoke cigarettes or if you have certain chronic medical conditions. You need 1 dose at age 65 (or older) if you have never been vaccinated.  Tetanus, diphtheria, pertussis (Tdap, Td). Get 1 dose of Tdap vaccine if you are younger than age 65, are over 65 and have contact with an infant, are a healthcare worker, are pregnant, or simply want to be protected from whooping cough. After that, you need a Td   booster dose every 10 years. Consult your caregiver if you have not had at least 3 tetanus and diphtheria-containing shots sometime in your life or have a deep or dirty wound.  HPV. You need this vaccine if you are a woman age 26 or younger. The vaccine is given in 3 doses over 6 months.  Measles, mumps, rubella (MMR). You need at least 1 dose of MMR if you were born in 1957 or later. You may also need a second dose.  Meningococcal. If you are age 19 to 21 and a first-year college student living in a residence hall, or have one of several medical conditions, you need to get vaccinated against meningococcal disease. You may also need additional booster doses.  Zoster (shingles). If you are age 60 or older, you should get this vaccine.  Varicella (chickenpox). If you have never had chickenpox or you were vaccinated but received only 1 dose, talk to your caregiver to find out if you need this vaccine.  Hepatitis A. You need this vaccine if you have a specific risk factor for hepatitis A virus infection or you simply wish to be protected from this disease. The vaccine is usually given as 2 doses, 6 to 18 months apart.  Hepatitis B. You need this vaccine if you have a specific risk factor for hepatitis B virus infection or you simply wish to be protected from this disease. The vaccine is given in 3 doses, usually over 6 months. Preventive Services / Frequency Ages 19 to 39  Blood  pressure check.** / Every 1 to 2 years.  Lipid and cholesterol check.** / Every 5 years beginning at age 20.  Clinical breast exam.** / Every 3 years for women in their 20s and 30s.  Pap test.** / Every 2 years from ages 21 through 29. Every 3 years starting at age 30 through age 65 or 70 with a history of 3 consecutive normal Pap tests.  HPV screening.** / Every 3 years from ages 30 through ages 65 to 70 with a history of 3 consecutive normal Pap tests.  Hepatitis C blood test.** / For any individual with known risks for hepatitis C.  Skin self-exam. / Monthly.  Influenza immunization.** / Every year.  Pneumococcal polysaccharide immunization.** / 1 to 2 doses if you smoke cigarettes or if you have certain chronic medical conditions.  Tetanus, diphtheria, pertussis (Tdap, Td) immunization. / A one-time dose of Tdap vaccine. After that, you need a Td booster dose every 10 years.  HPV immunization. / 3 doses over 6 months, if you are 26 and younger.  Measles, mumps, rubella (MMR) immunization. / You need at least 1 dose of MMR if you were born in 1957 or later. You may also need a second dose.  Meningococcal immunization. / 1 dose if you are age 19 to 21 and a first-year college student living in a residence hall, or have one of several medical conditions, you need to get vaccinated against meningococcal disease. You may also need additional booster doses.  Varicella immunization.** / Consult your caregiver.  Hepatitis A immunization.** / Consult your caregiver. 2 doses, 6 to 18 months apart.  Hepatitis B immunization.** / Consult your caregiver. 3 doses usually over 6 months. Ages 40 to 64  Blood pressure check.** / Every 1 to 2 years.  Lipid and cholesterol check.** / Every 5 years beginning at age 20.  Clinical breast exam.** / Every year after age 40.  Mammogram.** / Every year beginning at age 40   and continuing for as long as you are in good health. Consult with your  caregiver.  Pap test.** / Every 3 years starting at age 30 through age 65 or 70 with a history of 3 consecutive normal Pap tests.  HPV screening.** / Every 3 years from ages 30 through ages 65 to 70 with a history of 3 consecutive normal Pap tests.  Fecal occult blood test (FOBT) of stool. / Every year beginning at age 50 and continuing until age 75. You may not need to do this test if you get a colonoscopy every 10 years.  Flexible sigmoidoscopy or colonoscopy.** / Every 5 years for a flexible sigmoidoscopy or every 10 years for a colonoscopy beginning at age 50 and continuing until age 75.  Hepatitis C blood test.** / For all people born from 1945 through 1965 and any individual with known risks for hepatitis C.  Skin self-exam. / Monthly.  Influenza immunization.** / Every year.  Pneumococcal polysaccharide immunization.** / 1 to 2 doses if you smoke cigarettes or if you have certain chronic medical conditions.  Tetanus, diphtheria, pertussis (Tdap, Td) immunization.** / A one-time dose of Tdap vaccine. After that, you need a Td booster dose every 10 years.  Measles, mumps, rubella (MMR) immunization. / You need at least 1 dose of MMR if you were born in 1957 or later. You may also need a second dose.  Varicella immunization.** / Consult your caregiver.  Meningococcal immunization.** / Consult your caregiver.  Hepatitis A immunization.** / Consult your caregiver. 2 doses, 6 to 18 months apart.  Hepatitis B immunization.** / Consult your caregiver. 3 doses, usually over 6 months. Ages 65 and over  Blood pressure check.** / Every 1 to 2 years.  Lipid and cholesterol check.** / Every 5 years beginning at age 20.  Clinical breast exam.** / Every year after age 40.  Mammogram.** / Every year beginning at age 40 and continuing for as long as you are in good health. Consult with your caregiver.  Pap test.** / Every 3 years starting at age 30 through age 65 or 70 with a 3  consecutive normal Pap tests. Testing can be stopped between 65 and 70 with 3 consecutive normal Pap tests and no abnormal Pap or HPV tests in the past 10 years.  HPV screening.** / Every 3 years from ages 30 through ages 65 or 70 with a history of 3 consecutive normal Pap tests. Testing can be stopped between 65 and 70 with 3 consecutive normal Pap tests and no abnormal Pap or HPV tests in the past 10 years.  Fecal occult blood test (FOBT) of stool. / Every year beginning at age 50 and continuing until age 75. You may not need to do this test if you get a colonoscopy every 10 years.  Flexible sigmoidoscopy or colonoscopy.** / Every 5 years for a flexible sigmoidoscopy or every 10 years for a colonoscopy beginning at age 50 and continuing until age 75.  Hepatitis C blood test.** / For all people born from 1945 through 1965 and any individual with known risks for hepatitis C.  Osteoporosis screening.** / A one-time screening for women ages 65 and over and women at risk for fractures or osteoporosis.  Skin self-exam. / Monthly.  Influenza immunization.** / Every year.  Pneumococcal polysaccharide immunization.** / 1 dose at age 65 (or older) if you have never been vaccinated.  Tetanus, diphtheria, pertussis (Tdap, Td) immunization. / A one-time dose of Tdap vaccine if you are over   65 and have contact with an infant, are a Research scientist (physical sciences), or simply want to be protected from whooping cough. After that, you need a Td booster dose every 10 years.  Varicella immunization.** / Consult your caregiver.  Meningococcal immunization.** / Consult your caregiver.  Hepatitis A immunization.** / Consult your caregiver. 2 doses, 6 to 18 months apart.  Hepatitis B immunization.** / Check with your caregiver. 3 doses, usually over 6 months. ** Family history and personal history of risk and conditions may change your caregiver's recommendations. Document Released: 06/25/2001 Document Revised: 07/22/2011  Document Reviewed: 09/24/2010 [facility] Patient Information 2013 [place], [place].  Thank you for enrolling in MyChart. Please follow the instructions below to securely access your online medical record. MyChart allows you to send messages to your doctor, view your test results, manage appointments, and more.   How Do I Sign Up? 1. In your Internet browser, go to [employer] and enter [email or web address]. 2. Click on the Sign Up Now link in the Sign In box. You will see the New Member Sign Up page. 3. Enter your MyChart Access Code exactly as it appears below. You will not need to use this code after you've completed the sign-up process. If you do not sign up before the expiration date, you must request a new code. MyChart Access Code: [email or web address] Expires: 05/21/2012  8:49 AM  4. Enter your Social Security Number ([id number]) and Date of Birth (mm/dd/yyyy) as indicated and click Submit. You will be taken to the next sign-up page. 5. Create a MyChart ID. This will be your MyChart login ID and cannot be changed, so think of one that is secure and easy to remember. 6. Create a MyChart password. You can change your password at any time. 7. Enter your Password Reset Question and Answer. This can be used at a later time if you forget your password.  8. Enter your e-mail address. You will receive e-mail notification when new information is available in MyChart. 9. Click Sign Up. You can now view your medical record.   Additional Information Remember, MyChart is NOT to be used for urgent needs. For medical emergencies, dial 911.

## 2012-04-21 NOTE — Progress Notes (Signed)
  Subjective:     [name] is a 46 y.o. female and is here for a comprehensive gynecologic physical exam. The patient reports that her husband felt something in her vagina during intercourse; on the right side. She wants to make sure everything is okay. Denies dyspareunia, abnormal bleeding, abnormal discharge or other GYN concerns. She is also requesting a refill of her Ambien.  History   Social History  . Marital Status: Married    Spouse Name: N/A    Number of Children: 2  . Years of Education: N/A   Occupational History  . bank teller    Social History Main Topics  . Smoking status: Never Smoker   . Smokeless tobacco: Never Used  . Alcohol Use: No  . Drug Use: No  . Sexually Active: Yes -- Female partner(s)   Other Topics Concern  . Not on file   Social History Narrative  . No narrative on file   Health Maintenance  Topic Date Due  . Pap Smear  06/04/1982  . Tetanus/tdap  06/05/1983  . Influenza Vaccine  01/12/2012    The following portions of the patient's history were reviewed and updated as appropriate: allergies, current medications, past family history, past medical history, past social history, past surgical history and problem list.  Review of Systems Pertinent items are noted in HPI.   Objective:   BP 110/69  Pulse 63  Ht 5\' 7"  (1.702 m)  Wt 153 lb (69.4 kg)  BMI 23.96 kg/m2  LMP 04/14/2012 GENERAL: Well-developed, well-nourished female in no acute distress.  HEENT: Normocephalic, atraumatic. Sclerae anicteric.  NECK: Supple. Normal thyroid.  LUNGS: Clear to auscultation bilaterally.  HEART: Regular rate and rhythm. BREASTS: Symmetric in size. No masses, skin changes, nipple drainage, or lymphadenopathy. ABDOMEN: Soft, nontender, nondistended. No organomegaly. PELVIC: Normal external female genitalia. Vagina is pink and rugated. 1 cm round, nontender, nonerythematous vaginal inclusion cyst noted on the right vaginal wall, about 4 cm from the  introitus..  Normal discharge. Normal cervix contour. Pap smear obtained. Uterus is normal in size. No adnexal mass or tenderness.  EXTREMITIES: No cyanosis, clubbing, or edema, 2+ distal pulses.   Assessment:    Healthy female exam.  Vaginal inclusion cyst     Plan:    Pap done, will follow up results and manage accordingly. Mammogram will be scheduled Reassured about the benign nature of the inclusion cyst.  No intervention necessary unless any concerning symptoms arise (gets larger in size, pain, abnormal bleeding etc). Routine preventative health maintenance measures emphasized

## 2012-04-22 MED ORDER — ZOLPIDEM TARTRATE 5 MG PO TABS: 5.0000 mg | ORAL_TABLET | Freq: Every evening | ORAL | Status: DC | PRN

## 2012-04-22 NOTE — Addendum Note (Signed)
Addended by: Barbara Cower on: 04/22/2012 04:02 PM   Modules accepted: Orders

## 2014-03-14 ENCOUNTER — Encounter: Payer: Self-pay | Admitting: Obstetrics & Gynecology

## 2014-05-02 ENCOUNTER — Ambulatory Visit: Payer: Self-pay | Admitting: Family Medicine

## 2014-08-09 ENCOUNTER — Ambulatory Visit: Payer: Self-pay | Admitting: Family Medicine

## 2014-08-10 ENCOUNTER — Ambulatory Visit (INDEPENDENT_AMBULATORY_CARE_PROVIDER_SITE_OTHER): Payer: No Typology Code available for payment source | Admitting: Advanced Practice Midwife

## 2014-08-10 ENCOUNTER — Encounter: Payer: Self-pay | Admitting: Advanced Practice Midwife

## 2014-08-10 VITALS — BP 134/84 | HR 63 | Resp 16 | Wt 163.0 lb

## 2014-08-10 DIAGNOSIS — Z01419 Encounter for gynecological examination (general) (routine) without abnormal findings: Secondary | ICD-10-CM

## 2014-08-10 DIAGNOSIS — Z124 Encounter for screening for malignant neoplasm of cervix: Secondary | ICD-10-CM | POA: Diagnosis not present

## 2014-08-10 DIAGNOSIS — Z0000 Encounter for general adult medical examination without abnormal findings: Secondary | ICD-10-CM

## 2014-08-10 NOTE — Progress Notes (Signed)
Subjective:    [name] is a 49 y.o. female who presents for an annual exam. The patient has no complaints today. She does report irregular menses in last few months, usually light and regular before this year. The patient is sexually active. GYN screening history: last pap: was normal. The patient wears seatbelts: yes. The patient participates in regular exercise: yes. The patient reports that there is not domestic violence in her life.  She does not have a primary care provider, as her previous provider retired.    Menstrual History: OB History    Gravida Para Term Preterm AB TAB SAB Ectopic Multiple Living   3 2   1     2        Patient's last menstrual period was 05/27/2014 (approximate).    The following portions of the patient's history were reviewed and updated as appropriate: allergies, current medications, past family history, past medical history, past social history, past surgical history and problem list.  Review of Systems A comprehensive review of systems was negative except for: Musculoskeletal: positive for left knee pain and plantar fasciitis    Objective:     Physical Examination: General appearance - alert, well appearing, and in no distress, oriented to person, place, and time and acyanotic, in no respiratory distress Neck - supple, no significant adenopathy, thyroid exam: thyroid is normal in size without nodules or tenderness Chest - clear to auscultation, no wheezes, rales or rhonchi, symmetric air entry Heart - normal rate, regular rhythm, normal S1, S2, no murmurs, rubs, clicks or gallops Abdomen - soft, nontender, nondistended, no masses or organomegaly Breasts - breasts appear normal, no suspicious masses, no skin or nipple changes or axillary nodes Pelvic - normal external genitalia, vulva, vagina, cervix, uterus and adnexa, scant bleeding with pap Neurological - alert, oriented, normal speech, no focal findings or movement disorder noted Extremities -  peripheral pulses normal, no pedal edema, no clubbing or cyanosis Skin - normal coloration and turgor, no rashes, no suspicious skin lesions noted .    Assessment:    Healthy female exam.    Plan:     All questions answered. Await pap smear results. Blood tests: CBC, CMP, Vit D, lipid panel. Mammogram. Follow up in 1 year for well-woman exam, recommend primary care provider for routine care

## 2014-08-10 NOTE — Addendum Note (Signed)
Addended by: Tarry Kos on: 08/10/2014 05:15 PM   Modules accepted: Orders

## 2014-08-11 ENCOUNTER — Other Ambulatory Visit: Payer: No Typology Code available for payment source

## 2014-08-15 LAB — CYTOLOGY - PAP

## 2014-08-31 ENCOUNTER — Ambulatory Visit
Admit: 2014-08-31 | Disposition: A | Discharge status: Home / Self Care | Payer: Self-pay | Attending: Family Medicine | Admitting: Family Medicine

## 2014-09-02 ENCOUNTER — Ambulatory Visit
Admit: 2014-09-02 | Disposition: A | Discharge status: Home / Self Care | Payer: Self-pay | Attending: Family Medicine | Admitting: Family Medicine

## 2014-09-06 ENCOUNTER — Ambulatory Visit
Admit: 2014-09-06 | Disposition: A | Discharge status: Home / Self Care | Payer: Self-pay | Attending: Family Medicine | Admitting: Family Medicine

## 2014-09-07 ENCOUNTER — Ambulatory Visit
Admit: 2014-09-07 | Disposition: A | Discharge status: Home / Self Care | Payer: Self-pay | Attending: Family Medicine | Admitting: Family Medicine

## 2014-09-07 LAB — CBC WITH DIFFERENTIAL/PLATELET
BASOS PCT: 1.4 %
Basophil #: 0.1 10*3/uL (ref 0.0–0.1)
EOS PCT: 8.1 %
Eosinophil #: 0.6 10*3/uL (ref 0.0–0.7)
HCT: 41.4 % (ref 35.0–47.0)
HGB: 14 g/dL (ref 12.0–16.0)
LYMPHS ABS: 1.6 10*3/uL (ref 1.0–3.6)
Lymphocyte %: 20.7 %
MCH: 31.1 pg (ref 26.0–34.0)
MCHC: 33.8 g/dL (ref 32.0–36.0)
MCV: 92 fL (ref 80–100)
MONOS PCT: 6.5 %
Monocyte #: 0.5 x10 3/mm (ref 0.2–0.9)
NEUTROS ABS: 4.8 10*3/uL (ref 1.4–6.5)
Neutrophil %: 63.3 %
PLATELETS: 185 10*3/uL (ref 150–440)
RBC: 4.49 10*6/uL (ref 3.80–5.20)
RDW: 13.3 % (ref 11.5–14.5)
WBC: 7.6 10*3/uL (ref 3.6–11.0)

## 2014-09-12 ENCOUNTER — Encounter: Payer: No Typology Code available for payment source | Admitting: Surgery

## 2015-01-25 ENCOUNTER — Ambulatory Visit (INDEPENDENT_AMBULATORY_CARE_PROVIDER_SITE_OTHER): Payer: No Typology Code available for payment source | Admitting: Family Medicine

## 2015-01-25 ENCOUNTER — Encounter: Payer: Self-pay | Admitting: Family Medicine

## 2015-01-25 VITALS — BP 100/70 | HR 68 | Ht 67.0 in | Wt 163.0 lb

## 2015-01-25 DIAGNOSIS — K589 Irritable bowel syndrome without diarrhea: Secondary | ICD-10-CM

## 2015-01-25 DIAGNOSIS — E039 Hypothyroidism, unspecified: Secondary | ICD-10-CM | POA: Diagnosis not present

## 2015-01-25 DIAGNOSIS — Z818 Family history of other mental and behavioral disorders: Secondary | ICD-10-CM

## 2015-01-25 DIAGNOSIS — Z8249 Family history of ischemic heart disease and other diseases of the circulatory system: Secondary | ICD-10-CM | POA: Insufficient documentation

## 2015-01-25 DIAGNOSIS — Z780 Asymptomatic menopausal state: Secondary | ICD-10-CM | POA: Diagnosis not present

## 2015-01-25 DIAGNOSIS — Z820 Family history of epilepsy and other diseases of the nervous system: Secondary | ICD-10-CM

## 2015-01-25 DIAGNOSIS — J309 Allergic rhinitis, unspecified: Secondary | ICD-10-CM

## 2015-01-25 DIAGNOSIS — Z833 Family history of diabetes mellitus: Secondary | ICD-10-CM | POA: Diagnosis not present

## 2015-01-25 DIAGNOSIS — L259 Unspecified contact dermatitis, unspecified cause: Secondary | ICD-10-CM

## 2015-01-25 DIAGNOSIS — Z23 Encounter for immunization: Secondary | ICD-10-CM

## 2015-01-25 DIAGNOSIS — R011 Cardiac murmur, unspecified: Secondary | ICD-10-CM | POA: Diagnosis not present

## 2015-01-25 MED ORDER — PREDNISONE 20 MG PO TABS: 20.0000 mg | ORAL_TABLET | Freq: Two times a day (BID) | ORAL | Status: DC

## 2015-01-25 NOTE — Progress Notes (Signed)
Date:  01/25/2015   Name:  [name]   DOB:  [birth year]   MRN:  [id number]  PCP:  Adline Potter, MD    Chief Complaint: Rash   History of Present Illness:  This is a 49 y.o. female with rash x 3d after pulling weeds day before onset. Seems to be spreading up arms/legs now on neck and face. Poison oak twice this summer. Hx IBS generally well controlled with probiotics, hx int hemorrhoid with rectal bleeding, well controlled on stool softeners, had colonoscopy 3 yrs ago, showed benign polyp and diverticulosis, for repeat at 5 yrs. Tetanus status unknown, gets flu imm at work. Mammo and Pap qyr with GYN. Father with CAD/CHF/DM, sister with thyroid dz, FH dementia. Saw cardiology for murmur 2 yrs ago, EKG and echo normal. Gets occ B ankle edema with prolonged standing, resolves overnight. Having early menopause sxs, concerned re: forgetfulness.  Review of Systems:  Review of Systems  Constitutional: Negative for fever and chills.  HENT: Negative for ear pain and trouble swallowing.   Eyes: Negative for pain.  Respiratory: Negative for cough and shortness of breath.   Cardiovascular: Negative for chest pain.  Gastrointestinal: Negative for abdominal pain.  Endocrine: Negative for polyuria.  Genitourinary: Negative for pelvic pain.  Neurological: Negative for tremors, syncope and headaches.  Hematological: Negative for adenopathy.    Patient Active Problem List   Diagnosis Date Noted  . FH: heart disease 01/25/2015  . FH: dementia 01/25/2015  . Menopause 01/25/2015  . Rhinitis, allergic 01/25/2015  . FH: diabetes mellitus 01/25/2015  . IBS (irritable bowel syndrome) 06/13/2011  . Hemorrhoid 06/04/2011    Prior to Admission medications   Medication Sig Start Date End Date Taking? Authorizing Provider  docusate sodium (COLACE) 100 MG capsule Take 100 mg by mouth 2 (two) times daily. otc   Yes Historical Provider, MD  Ginkgo 60 MG TABS Take 1 tablet by mouth daily at 6 (six)  AM.   Yes Historical Provider, MD  loratadine (CLARITIN) 10 MG tablet Take 10 mg by mouth daily. otc   Yes Historical Provider, MD  Multiple Vitamins-Minerals (MULTIVITAMIN WITH MINERALS) tablet Take 1 tablet by mouth daily.   Yes Historical Provider, MD  Probiotic Product (ALIGN) 4 MG CAPS Take 1 capsule by mouth daily. 06/13/11  Yes Jerene Bears, MD  predniSONE (DELTASONE) 20 MG tablet Take 1 tablet (20 mg total) by mouth 2 (two) times daily with a meal. 01/25/15   Adline Potter, MD    Allergies  Allergen Reactions  . Doxycycline Other (See Comments)    Thrush  . Ciprofloxacin Other (See Comments)    Yeast infection  . Sulfa Antibiotics Other (See Comments)    Yeast infections    Past Surgical History  Procedure Laterality Date  . Tubal ligation    . Breast surgery  1994    left breast bx  . Leep  2002  . Dilation and curettage of uterus      TAB  . Colposcopy  2002    ABNORMAL PAP  . Colonoscopy w/ biopsies  2013    divert. cleared for 5 yrs- [facility]    Social History  Substance Use Topics  . Smoking status: Never Smoker   . Smokeless tobacco: Never Used  . Alcohol Use: No    Family History  Problem Relation Age of Onset  . Hypertension Father   . Diabetes Father   . Stroke Maternal Grandfather   . Diabetes Paternal Aunt   .  Heart disease Paternal Aunt   . Diabetes Paternal Uncle   . Heart disease Paternal Uncle   . Colon cancer Neg Hx   . Esophageal cancer Neg Hx   . Stomach cancer Neg Hx   . Cancer Maternal Grandmother     female cancer    Medication list has been reviewed and updated.  Physical Examination: BP 100/70 mmHg  Pulse 68  Ht 5\' 7"  (1.702 m)  Wt 163 lb (73.936 kg)  BMI 25.52 kg/m2  LMP 12/11/2014  Physical Exam  Constitutional: She is oriented to person, place, and time. She appears well-developed and well-nourished.  HENT:  Head: Normocephalic and atraumatic.  Right Ear: External ear normal.  Left Ear: External ear normal.  Nose:  Nose normal.  Mouth/Throat: Oropharynx is clear and moist.  Eyes: Conjunctivae and EOM are normal. Pupils are equal, round, and reactive to light.  Neck: Neck supple. No thyromegaly present.  Cardiovascular: Normal rate and regular rhythm.   2/6 SEM  Pulmonary/Chest: Effort normal and breath sounds normal.  Abdominal: Soft. She exhibits no distension and no mass. There is no tenderness.  Musculoskeletal: She exhibits no edema.  Lymphadenopathy:    She has no cervical adenopathy.  Neurological: She is alert and oriented to person, place, and time.  Skin:  Extensive erythematous rash with occ vesicles over arms/legs, neck, face  Psychiatric: She has a normal mood and affect. Her behavior is normal.    Assessment and Plan:  1. Contact dermatitis Extensive from weed exposure, avoid in future - predniSONE (DELTASONE) 20 MG tablet; Take 1 tablet (20 mg total) by mouth 2 (two) times daily with a meal.  Dispense: 10 tablet; Refill: 0  2. IBS (irritable bowel syndrome) Well controlled on probiotics - docusate sodium (COLACE) 100 MG capsule; Take 100 mg by mouth 2 (two) times daily. otc  3. Menopause Discussed treatment, ginko ok - Ginkgo 60 MG TABS; Take 1 tablet by mouth daily at 6 (six) AM. - TSH - Comprehensive metabolic panel - CBC  4. Allergic rhinitis, unspecified allergic rhinitis type Well controlled on Claritin, continue - loratadine (CLARITIN) 10 MG tablet; Take 10 mg by mouth daily. otc  5. Systolic murmur Benign per cardiology w/u 2014  6. FH: diabetes mellitus - HgB A1c  7. FH: heart disease - Lipid Profile  8. FH: dementia Discussed prevention strategies - Vitamin D (25 hydroxy) - B12  Return in about 4 weeks (around 02/22/2015).  Satira Anis. [facility]  01/25/2015

## 2015-01-26 DIAGNOSIS — E039 Hypothyroidism, unspecified: Secondary | ICD-10-CM | POA: Insufficient documentation

## 2015-01-26 LAB — LIPID PANEL
CHOLESTEROL TOTAL: 173 mg/dL (ref 100–199)
Chol/HDL Ratio: 3.4 ratio units (ref 0.0–4.4)
HDL: 51 mg/dL (ref >39)
LDL CALC: 95 mg/dL (ref 0–99)
TRIGLYCERIDES: 136 mg/dL (ref 0–149)
VLDL Cholesterol Cal: 27 mg/dL (ref 5–40)

## 2015-01-26 LAB — CBC
HEMATOCRIT: 40.2 % (ref 34.0–46.6)
HEMOGLOBIN: 13.3 g/dL (ref 11.1–15.9)
MCH: 31.1 pg (ref 26.6–33.0)
MCHC: 33.1 g/dL (ref 31.5–35.7)
MCV: 94 fL (ref 79–97)
Platelets: 199 10*3/uL (ref 150–379)
RBC: 4.27 x10E6/uL (ref 3.77–5.28)
RDW: 13.4 % (ref 12.3–15.4)
WBC: 6.2 10*3/uL (ref 3.4–10.8)

## 2015-01-26 LAB — TSH: TSH: 7 u[IU]/mL — AB (ref 0.450–4.500)

## 2015-01-26 LAB — COMPREHENSIVE METABOLIC PANEL
ALBUMIN: 4.4 g/dL (ref 3.5–5.5)
ALT: 9 IU/L (ref 0–32)
AST: 20 IU/L (ref 0–40)
Albumin/Globulin Ratio: 2.1 (ref 1.1–2.5)
Alkaline Phosphatase: 63 IU/L (ref 39–117)
BILIRUBIN TOTAL: 0.3 mg/dL (ref 0.0–1.2)
BUN / CREAT RATIO: 12 (ref 9–23)
BUN: 9 mg/dL (ref 6–24)
CALCIUM: 9.3 mg/dL (ref 8.7–10.2)
CHLORIDE: 99 mmol/L (ref 97–108)
CO2: 24 mmol/L (ref 18–29)
CREATININE: 0.75 mg/dL (ref 0.57–1.00)
GFR, EST AFRICAN AMERICAN: 107 mL/min/{1.73_m2} (ref >59)
GFR, EST NON AFRICAN AMERICAN: 93 mL/min/{1.73_m2} (ref >59)
GLUCOSE: 68 mg/dL (ref 65–99)
Globulin, Total: 2.1 g/dL (ref 1.5–4.5)
Potassium: 3.9 mmol/L (ref 3.5–5.2)
Sodium: 145 mmol/L — ABNORMAL HIGH (ref 134–144)
TOTAL PROTEIN: 6.5 g/dL (ref 6.0–8.5)

## 2015-01-26 LAB — HEMOGLOBIN A1C
ESTIMATED AVERAGE GLUCOSE: 103 mg/dL
Hgb A1c MFr Bld: 5.2 % (ref 4.8–5.6)

## 2015-01-26 LAB — VITAMIN B12: Vitamin B-12: 514 pg/mL (ref 211–946)

## 2015-01-26 LAB — VITAMIN D 25 HYDROXY (VIT D DEFICIENCY, FRACTURES): Vit D, 25-Hydroxy: 30.8 ng/mL (ref 30.0–100.0)

## 2015-01-26 MED ORDER — LEVOTHYROXINE SODIUM 25 MCG PO TABS: 25.0000 ug | ORAL_TABLET | Freq: Every day | ORAL | Status: DC

## 2015-01-26 NOTE — Addendum Note (Signed)
Addended by: Adline Potter on: 01/26/2015 08:37 AM   Modules accepted: Orders

## 2015-02-22 ENCOUNTER — Ambulatory Visit (INDEPENDENT_AMBULATORY_CARE_PROVIDER_SITE_OTHER): Payer: No Typology Code available for payment source | Admitting: Certified Nurse Midwife

## 2015-02-22 ENCOUNTER — Encounter: Payer: Self-pay | Admitting: Certified Nurse Midwife

## 2015-02-22 VITALS — BP 118/74 | HR 54 | Wt 163.0 lb

## 2015-02-22 DIAGNOSIS — N760 Acute vaginitis: Secondary | ICD-10-CM

## 2015-02-22 MED ORDER — FLUCONAZOLE 150 MG PO TABS: 150.0000 mg | ORAL_TABLET | Freq: Once | ORAL | Status: DC

## 2015-02-22 NOTE — Progress Notes (Signed)
SUBJECTIVE:  49 y.o. female complains of white and curd-like vaginal discharge for 1 week(s). Denies abnormal vaginal bleeding or significant pelvic pain or fever. No UTI symptoms. Denies history of known exposure to STD.  Patient's last menstrual period was 12/12/2014.  OBJECTIVE:  She appears well, afebrile. Abdomen: benign, soft, nontender, no masses. Pelvic Exam: normal external genitalia, vulva, vagina, cervix, uterus and adnexa. Urine dipstick: not done.  ASSESSMENT:  monilia vaginitis  PLAN:   Treatment: abstain from coitus during course of treatment and diflucan ROV prn if symptoms persist or worsen.

## 2015-02-23 LAB — WET PREP, GENITAL
Clue Cells Wet Prep HPF POC: NONE SEEN
Trich, Wet Prep: NONE SEEN
WBC, Wet Prep HPF POC: NONE SEEN
Yeast Wet Prep HPF POC: NONE SEEN

## 2015-02-24 ENCOUNTER — Ambulatory Visit (INDEPENDENT_AMBULATORY_CARE_PROVIDER_SITE_OTHER): Payer: No Typology Code available for payment source | Admitting: Family Medicine

## 2015-02-24 ENCOUNTER — Encounter: Payer: Self-pay | Admitting: Family Medicine

## 2015-02-24 VITALS — BP 120/70 | HR 60 | Ht 67.0 in | Wt 163.5 lb

## 2015-02-24 DIAGNOSIS — E039 Hypothyroidism, unspecified: Secondary | ICD-10-CM

## 2015-02-24 DIAGNOSIS — L719 Rosacea, unspecified: Secondary | ICD-10-CM | POA: Diagnosis not present

## 2015-02-24 DIAGNOSIS — J309 Allergic rhinitis, unspecified: Secondary | ICD-10-CM | POA: Diagnosis not present

## 2015-02-24 DIAGNOSIS — Z780 Asymptomatic menopausal state: Secondary | ICD-10-CM | POA: Diagnosis not present

## 2015-02-24 DIAGNOSIS — K589 Irritable bowel syndrome without diarrhea: Secondary | ICD-10-CM

## 2015-02-24 MED ORDER — METRONIDAZOLE 0.75 % EX LOTN: 1.0000 "application " | TOPICAL_LOTION | Freq: Two times a day (BID) | CUTANEOUS | Status: DC

## 2015-02-24 NOTE — Progress Notes (Signed)
Date:  02/24/2015   Name:  [name]   DOB:  1966/02/07   MRN:  [id number]  PCP:  Adline Potter, MD    Chief Complaint: Hypothyroidism   History of Present Illness:  This is a 49 y.o. female for f/u hypothyroidism, started on Synthroid 1 month ago, feels more constipated but otherwise no change. Generally not feeling well due to irregular and heavy periods, GYN following. Took Diflucan for yeast vaginitis 2d ago, doesn't feel much better. AR generally well controlled on Claritin. Has rosacea, uses metronidazole lotion daily, needs refill. Still having some L pedal edema with prolonged standing, improves overnight. Received flu imm at work, had colonosocopy 3 yrs ago.  Review of Systems:  Review of Systems  Constitutional: Negative for fever.  Respiratory: Negative for shortness of breath.   Cardiovascular: Negative for chest pain.  Gastrointestinal: Negative for abdominal pain.  Genitourinary: Negative for difficulty urinating.  Neurological: Negative for syncope and light-headedness.    Patient Active Problem List   Diagnosis Date Noted  . Hypothyroidism 01/26/2015  . FH: heart disease 01/25/2015  . FH: dementia 01/25/2015  . Menopause 01/25/2015  . Rhinitis, allergic 01/25/2015  . FH: diabetes mellitus 01/25/2015  . Systolic murmur 51/76/1607  . IBS (irritable bowel syndrome) 06/13/2011  . Hemorrhoid 06/04/2011    Prior to Admission medications   Medication Sig Start Date End Date Taking? Authorizing Provider  docusate sodium (COLACE) 100 MG capsule Take 100 mg by mouth 2 (two) times daily. otc   Yes Historical Provider, MD  Ginkgo 60 MG TABS Take 1 tablet by mouth daily at 6 (six) AM.   Yes Historical Provider, MD  levothyroxine (LEVOTHROID) 25 MCG tablet Take 1 tablet (25 mcg total) by mouth daily before breakfast. 01/26/15  Yes Adline Potter, MD  loratadine (CLARITIN) 10 MG tablet Take 10 mg by mouth daily. otc   Yes Historical Provider, MD  Multiple  Vitamins-Minerals (MULTIVITAMIN WITH MINERALS) tablet Take 1 tablet by mouth daily.   Yes Historical Provider, MD  Probiotic Product (ALIGN) 4 MG CAPS Take 1 capsule by mouth daily. 06/13/11  Yes Jerene Bears, MD  METRONIDAZOLE, TOPICAL, 0.75 % LOTN Apply 1 application topically 2 (two) times daily. 02/24/15   Adline Potter, MD    Allergies  Allergen Reactions  . Doxycycline Other (See Comments)    Thrush  . Ciprofloxacin Other (See Comments)    Yeast infection  . Sulfa Antibiotics Other (See Comments)    Yeast infections    Past Surgical History  Procedure Laterality Date  . Tubal ligation    . Breast surgery  1994    left breast bx  . Leep  2002  . Dilation and curettage of uterus      TAB  . Colposcopy  2002    ABNORMAL PAP  . Colonoscopy w/ biopsies  2013    divert. cleared for 5 yrs- [facility]    Social History  Substance Use Topics  . Smoking status: Never Smoker   . Smokeless tobacco: Never Used  . Alcohol Use: No    Family History  Problem Relation Age of Onset  . Hypertension Father   . Diabetes Father   . Stroke Maternal Grandfather   . Diabetes Paternal Aunt   . Heart disease Paternal Aunt   . Diabetes Paternal Uncle   . Heart disease Paternal Uncle   . Colon cancer Neg Hx   . Esophageal cancer Neg Hx   . Stomach cancer Neg Hx   .  Cancer Maternal Grandmother     female cancer    Medication list has been reviewed and updated.  Physical Examination: BP 120/70 mmHg  Pulse 60  Ht 5\' 7"  (1.702 m)  Wt 163 lb 8 oz (74.163 kg)  BMI 25.60 kg/m2  LMP 12/12/2014  Physical Exam  Constitutional: She appears well-developed and well-nourished.  HENT:  Mouth/Throat: Oropharynx is clear and moist.  Neck: No thyromegaly present.  Cardiovascular: Normal rate, regular rhythm and normal heart sounds.   Pulmonary/Chest: Effort normal and breath sounds normal.  Musculoskeletal: She exhibits no edema.  Neurological: She is alert. Coordination normal.  Skin:  Skin is warm and dry.  Psychiatric: She has a normal mood and affect. Her behavior is normal.  Nursing note and vitals reviewed.   Assessment and Plan:  1. Hypothyroidism, unspecified hypothyroidism type Continue Synthorid, check TSH today, will need refill to mail order if lab ok - TSH  2. Rosacea Refill metronidazole lotion - METRONIDAZOLE, TOPICAL, 0.75 % LOTN; Apply 1 application topically 2 (two) times daily.  Dispense: 59 mL; Refill: 3  3. IBS (irritable bowel syndrome) Recommend increased fiber intake, dietary or supplement  4. Allergic rhinitis, unspecified allergic rhinitis type Continue Claritin  5. Menopause Follow up with GYN  Return in about 6 months (around 08/25/2015).  Satira Anis. [facility]  02/24/2015

## 2015-02-25 LAB — TSH: TSH: 4.19 u[IU]/mL (ref 0.450–4.500)

## 2015-02-27 ENCOUNTER — Telehealth: Payer: Self-pay

## 2015-02-27 ENCOUNTER — Other Ambulatory Visit: Payer: Self-pay | Admitting: Family Medicine

## 2015-02-27 MED ORDER — LEVOTHYROXINE SODIUM 25 MCG PO TABS: 25.0000 ug | ORAL_TABLET | Freq: Every day | ORAL | Status: DC

## 2015-02-27 NOTE — Telephone Encounter (Signed)
Sent message

## 2015-02-27 NOTE — Telephone Encounter (Signed)
Ok will send

## 2015-03-01 ENCOUNTER — Telehealth: Payer: Self-pay | Admitting: *Deleted

## 2015-03-01 DIAGNOSIS — N898 Other specified noninflammatory disorders of vagina: Secondary | ICD-10-CM

## 2015-03-01 MED ORDER — METRONIDAZOLE 500 MG PO TABS: 500.0000 mg | ORAL_TABLET | Freq: Two times a day (BID) | ORAL | Status: DC

## 2015-03-01 MED ORDER — FLUCONAZOLE 150 MG PO TABS: 150.0000 mg | ORAL_TABLET | Freq: Once | ORAL | Status: DC

## 2015-03-01 NOTE — Telephone Encounter (Signed)
-----   Message from Francia Greaves sent at 03/01/2015 11:34 AM EDT ----- Regarding: Yeast Infection Contact: [phone] Patient was seen her on 10/12 for a yeast infection, symptoms haven't gotten any better wants to know if we could give her anything else or would you like for her to make another appt.

## 2015-03-01 NOTE — Telephone Encounter (Signed)
Pt seen on 10-12 for vaginal discharge and itching, wet prep negative, given Diflucan.  Pt called stating symptoms are worse and is having a foul smelling brown discharge consistent with BV.  Sent rx for Flagyl to pharmacy and one more round of Diflucan.  Instructed pt on use of medications.  Pt will call back after treatment is symptoms persist and will make appt for follow-up.

## 2015-03-15 ENCOUNTER — Encounter: Payer: Self-pay | Admitting: Obstetrics & Gynecology

## 2015-03-15 ENCOUNTER — Encounter: Payer: Self-pay | Admitting: *Deleted

## 2015-03-15 ENCOUNTER — Ambulatory Visit (INDEPENDENT_AMBULATORY_CARE_PROVIDER_SITE_OTHER): Payer: No Typology Code available for payment source | Admitting: Obstetrics & Gynecology

## 2015-03-15 VITALS — BP 112/71 | Wt 162.0 lb

## 2015-03-15 DIAGNOSIS — Z0000 Encounter for general adult medical examination without abnormal findings: Secondary | ICD-10-CM

## 2015-03-15 DIAGNOSIS — N939 Abnormal uterine and vaginal bleeding, unspecified: Secondary | ICD-10-CM

## 2015-03-15 DIAGNOSIS — N8501 Benign endometrial hyperplasia: Secondary | ICD-10-CM | POA: Insufficient documentation

## 2015-03-15 LAB — CBC
HEMATOCRIT: 37.1 % (ref 36.0–46.0)
Hemoglobin: 12.1 g/dL (ref 12.0–15.0)
MCH: 30.6 pg (ref 26.0–34.0)
MCHC: 32.6 g/dL (ref 30.0–36.0)
MCV: 93.7 fL (ref 78.0–100.0)
MPV: 11.2 fL (ref 8.6–12.4)
PLATELETS: 228 10*3/uL (ref 150–400)
RBC: 3.96 MIL/uL (ref 3.87–5.11)
RDW: 13 % (ref 11.5–15.5)
WBC: 4.9 10*3/uL (ref 4.0–10.5)

## 2015-03-15 MED ORDER — MEGESTROL ACETATE 40 MG PO TABS: 40.0000 mg | ORAL_TABLET | Freq: Two times a day (BID) | ORAL | Status: DC

## 2015-03-15 NOTE — Patient Instructions (Signed)

## 2015-03-15 NOTE — Progress Notes (Signed)
CLINIC ENCOUNTER NOTE  History:  49 y.o. [id number] here today for evaluation of AUB since August 2016.  Noted irregular menses since January this year with occasionally skipping cycles and heavier bleeding.  In August, her period started and has never stopped.  Bleeding is moderate-heavy, associated with cramping.  Endorses rare episodes of lightheadedness, no other presyncopal episodes.  Of note, she has thyroid dysfunction and had her Synthroid dose adjusted recently but this did not help her AUB. She denies any current abnormal vaginal discharge,  pelvic pain or other concerns.   Past Medical History  Diagnosis Date  . Abnormal Pap smear 2002    LGSIL  . Hemorrhoid   . Allergy     Past Surgical History  Procedure Laterality Date  . Tubal ligation    . Breast surgery  1994    left breast bx  . Leep  2002  . Dilation and curettage of uterus      TAB  . Colposcopy  2002    ABNORMAL PAP  . Colonoscopy w/ biopsies  2013    divert. cleared for 5 yrs- [facility]    The following portions of the patient's history were reviewed and updated as appropriate: allergies, current medications, past family history, past medical history, past social history, past surgical history and problem list.   Health Maintenance:  Normal pap in 07/2014.  Normal mammogram in 2013.   Review of Systems:  Pertinent items noted in HPI and remainder of comprehensive ROS otherwise negative.  Objective:  Physical Exam BP 112/71 mmHg  Wt 162 lb (73.483 kg)  LMP 12/12/2014 CONSTITUTIONAL: Well-developed, well-nourished female in no acute distress.  HENT:  Normocephalic, atraumatic. External right and left ear normal. Oropharynx is clear and moist EYES: Conjunctivae and EOM are normal. Pupils are equal, round, and reactive to light. No scleral icterus.  NECK: Normal range of motion, supple, no masses SKIN: Skin is warm and dry. No rash noted. Not diaphoretic. No erythema. No pallor. [place]ele: Alert and  oriented to person, place, and time. Normal reflexes, muscle tone coordination. No cranial nerve deficit noted. PSYCHIATRIC: Normal mood and affect. Normal behavior. Normal judgment and thought content. CARDIOVASCULAR: Normal heart rate noted RESPIRATORY: Effort and breath sounds normal, no problems with respiration noted ABDOMEN: Soft, no distention noted.   MUSCULOSKELETAL: Normal range of motion. No edema noted. PELVIC: Normal appearing external genitalia; normal appearing vaginal mucosa and cervix. Active bleeding noted from cervix, moderate dark blood noted in vault.  Normal uterine size, no other palpable masses, no uterine or adnexal tenderness.  ENDOMETRIAL BIOPSY     The indications for endometrial biopsy were reviewed.   Risks of the biopsy including cramping, bleeding, infection, uterine perforation, inadequate specimen and need for additional procedures  were discussed. The patient states she understands and agrees to undergo procedure today. Consent was signed. Time out was performed.  During the pelvic exam, the cervix was prepped with Betadine.  The 3 mm pipelle was introduced into the endometrial cavity without difficulty to a depth of 9cm, and it immediately filled up with blood.  This occurred multiple times to obtain a significant amount of bloody tissue that sent to pathology. The instruments were removed from the patient's vagina. Minimal bleeding from the cervix was noted. The patient tolerated the procedure well. Routine post-procedure instructions were given to the patient.     Assessment & Plan:  1. Abnormal uterine bleeding (AUB) Perimenopausal etiology is likely, but will rule out other etiologies which  were discussed in detail.  Megace ordered. Bleeding precautions reviewed.  Will follow up endometrial biopsy. - CBC - Follicle stimulating hormone - Prolactin - hCG, quantitative, pregnancy - US Pelvis Complete; Future - US Transvaginal Non-OB; Future - Surgical  pathology - megestrol (MEGACE) 40 MG tablet; Take 1 tablet (40 mg total) by mouth 2 (two) times daily. Can increase to two tablets twice a day in the event of heavy bleeding  Dispense: 60 tablet; Refill: 5  2. Preventative health care - MM DIGITAL SCREENING BILATERAL ordered. Routine preventative health maintenance measures emphasized. Please refer to After Visit Summary for other counseling recommendations.   Return in about 2 weeks (around 03/29/2015) for Followup of AUB, discuss results.   Total face-to-face time with patient: 25 minutes. Over 50% of encounter was spent on counseling and coordination of care.   Verita Schneiders, MD, [place] Attending Obstetrician & Gynecologist, [place] for [name]

## 2015-03-16 LAB — HCG, QUANTITATIVE, PREGNANCY

## 2015-03-16 LAB — FOLLICLE STIMULATING HORMONE: FSH: 9.8 m[IU]/mL

## 2015-03-16 LAB — PROLACTIN: PROLACTIN: 6.1 ng/mL

## 2015-03-17 ENCOUNTER — Ambulatory Visit
Admission: RE | Admit: 2015-03-17 | Discharge: 2015-03-17 | Disposition: A | Discharge status: Home / Self Care | Payer: No Typology Code available for payment source | Source: Ambulatory Visit | Attending: Obstetrics & Gynecology | Admitting: Obstetrics & Gynecology

## 2015-03-17 ENCOUNTER — Telehealth: Payer: Self-pay | Admitting: *Deleted

## 2015-03-17 DIAGNOSIS — D219 Benign neoplasm of connective and other soft tissue, unspecified: Secondary | ICD-10-CM | POA: Insufficient documentation

## 2015-03-17 DIAGNOSIS — N83202 Unspecified ovarian cyst, left side: Secondary | ICD-10-CM | POA: Insufficient documentation

## 2015-03-17 DIAGNOSIS — D250 Submucous leiomyoma of uterus: Secondary | ICD-10-CM | POA: Insufficient documentation

## 2015-03-17 DIAGNOSIS — N939 Abnormal uterine and vaginal bleeding, unspecified: Secondary | ICD-10-CM | POA: Diagnosis present

## 2015-03-17 DIAGNOSIS — D259 Leiomyoma of uterus, unspecified: Secondary | ICD-10-CM

## 2015-03-17 DIAGNOSIS — N83209 Unspecified ovarian cyst, unspecified side: Secondary | ICD-10-CM | POA: Insufficient documentation

## 2015-03-17 NOTE — Telephone Encounter (Signed)
-----   Message from Osborne Oman, MD sent at 03/17/2015 11:22 AM EDT ----- Multiple fibroids noted, small left ovarian cyst.  Can discuss management during next visit. Please call to inform patient of results and recommendations.

## 2015-03-17 NOTE — Telephone Encounter (Signed)
Called pt, no answer, left message for pt to call the office.

## 2015-03-20 ENCOUNTER — Encounter: Payer: Self-pay | Admitting: *Deleted

## 2015-03-21 ENCOUNTER — Encounter: Payer: Self-pay | Admitting: Obstetrics & Gynecology

## 2015-03-21 NOTE — Progress Notes (Signed)
Quick Note:  Phone Call Documentation: Endometrial biopsy showed SIMPLE ENDOMETRIAL HYPERPLASIA WITHOUT ATYPIA, NO MALIGNANCY. Called patient and discussed results with her, also discussed results of recent ultrasound. We will discuss long term management during her visit on 03/29/15; she is already on Megace therapy which is one of the options for treatment and this is helping to control her AUB. No further questions were asked by patient.  Osborne Oman, MD 03/21/2015 8:46 AM   ______

## 2015-03-29 ENCOUNTER — Ambulatory Visit (INDEPENDENT_AMBULATORY_CARE_PROVIDER_SITE_OTHER): Payer: No Typology Code available for payment source | Admitting: Obstetrics & Gynecology

## 2015-03-29 ENCOUNTER — Encounter: Payer: Self-pay | Admitting: Obstetrics & Gynecology

## 2015-03-29 VITALS — BP 111/72 | HR 60 | Resp 18 | Ht 67.0 in | Wt 163.0 lb

## 2015-03-29 DIAGNOSIS — N8501 Benign endometrial hyperplasia: Secondary | ICD-10-CM | POA: Diagnosis not present

## 2015-03-29 DIAGNOSIS — N939 Abnormal uterine and vaginal bleeding, unspecified: Secondary | ICD-10-CM

## 2015-03-29 DIAGNOSIS — N83202 Unspecified ovarian cyst, left side: Secondary | ICD-10-CM

## 2015-03-29 DIAGNOSIS — D259 Leiomyoma of uterus, unspecified: Secondary | ICD-10-CM | POA: Diagnosis not present

## 2015-03-29 NOTE — Patient Instructions (Signed)
Laparoscopically Assisted Vaginal Hysterectomy A laparoscopically assisted vaginal hysterectomy (LAVH) is a surgical procedure to remove the uterus and cervix, and sometimes the ovaries and fallopian tubes. During an LAVH, some of the surgical removal is done through the vagina, and the rest is done through a few small surgical cuts (incisions) in the abdomen.  This procedure is usually considered in women when a vaginal hysterectomy is not an option. Your health care provider will discuss the risks and benefits of the different surgical techniques at your appointment. Generally, recovery time is faster and there are fewer complications after laparoscopic procedures than after open incisional procedures. LET YOUR HEALTH CARE PROVIDER KNOW ABOUT:   Any allergies you have.  All medicines you are taking, including vitamins, herbs, eye drops, creams, and over-the-counter medicines.  Previous problems you or members of your family have had with the use of anesthetics.  Any blood disorders you have.  Previous surgeries you have had.  Medical conditions you have. RISKS AND COMPLICATIONS Generally, this is a safe procedure. However, as with any procedure, complications can occur. Possible complications include:  Allergies to medicines.  Difficulty breathing.  Bleeding.  Infection.  Damage to other structures near your uterus and cervix. BEFORE THE PROCEDURE  Ask your health care provider about changing or stopping your regular medicines.  Take certain medicines, such as a colon-emptying preparation, as directed.  Do not eat or drink anything for at least 8 hours before your surgery.  Stop smoking if you smoke. Stopping will improve your health after surgery.  Arrange for a ride home after surgery and for help at home during recovery. PROCEDURE   An IV tube will be put into one of your veins in order to give you fluids and medicines.  You will receive medicines to relax you and  medicines that make you sleep (general anesthetic).  You may have a flexible tube (catheter) put into your bladder to drain urine.  You may have a tube put through your nose or mouth that goes into your stomach (nasogastric tube). The nasogastric tube removes digestive fluids and prevents you from feeling nauseated and from vomiting.  Tight-fitting (compression) stockings will be placed on your legs to promote circulation.  Three to four small incisions will be made in your abdomen. An incision also will be made in your vagina. Probes and tools will be inserted into the small incisions. The uterus and cervix are removed (and possibly your ovaries and fallopian tubes) through your vagina as well as through the small incisions that were made in the abdomen.  Your vagina is then sewn back to normal. AFTER THE PROCEDURE  You may have a liquid diet temporarily. You will most likely return to, and tolerate, your usual diet the day after surgery.  You will be passing urine through a catheter. It will be removed the day after surgery.  Your temperature, breathing rate, heart rate, blood pressure, and oxygen level will be monitored regularly.  You will still wear compression stockings on your legs until you are able to move around.  You will use a special device or do breathing exercises to keep your lungs clear.  You will be encouraged to walk as soon as possible.   This information is not intended to replace advice given to you by your health care provider. Make sure you discuss any questions you have with your health care provider.   Document Released: 04/18/2011 Document Revised: 05/20/2014 Document Reviewed: 11/12/2012 Elsevier Interactive Patient Education 2016 Elsevier   Inc.   Hysterectomy Information  A hysterectomy is a surgery in which your uterus is removed. This surgery may be done to treat various medical problems. After the surgery, you will no longer have menstrual periods. The  surgery will also make you unable to become pregnant (sterile). The fallopian tubes and ovaries can be removed (bilateral salpingo-oophorectomy) during this surgery as well.  REASONS FOR A HYSTERECTOMY  Persistent, abnormal bleeding.  Lasting (chronic) pelvic pain or infection.  The lining of the uterus (endometrium) starts growing outside the uterus (endometriosis).  The endometrium starts growing in the muscle of the uterus (adenomyosis).  The uterus falls down into the vagina (pelvic organ prolapse).  Noncancerous growths in the uterus (uterine fibroids) that cause symptoms.  Precancerous cells.  Cervical cancer or uterine cancer. TYPES OF HYSTERECTOMIES  Supracervical hysterectomy--In this type, the top part of the uterus is removed, but not the cervix.  Total hysterectomy--The uterus and cervix are removed.  Radical hysterectomy--The uterus, the cervix, and the fibrous tissue that holds the uterus in place in the pelvis (parametrium) are removed. WAYS A HYSTERECTOMY CAN BE PERFORMED  Abdominal hysterectomy--A large surgical cut (incision) is made in the abdomen. The uterus is removed through this incision.  Vaginal hysterectomy--An incision is made in the vagina. The uterus is removed through this incision. There are no abdominal incisions.  Conventional laparoscopic hysterectomy--Three or four small incisions are made in the abdomen. A thin, lighted tube with a camera (laparoscope) is inserted into one of the incisions. Other tools are put through the other incisions. The uterus is cut into small pieces. The small pieces are removed through the incisions, or they are removed through the vagina.  Laparoscopically assisted vaginal hysterectomy (LAVH)--Three or four small incisions are made in the abdomen. Part of the surgery is performed laparoscopically and part vaginally. The uterus is removed through the vagina.  Robot-assisted laparoscopic hysterectomy--A laparoscope and  other tools are inserted into 3 or 4 small incisions in the abdomen. A computer-controlled device is used to give the surgeon a 3D image and to help control the surgical instruments. This allows for more precise movements of surgical instruments. The uterus is cut into small pieces and removed through the incisions or removed through the vagina. RISKS AND COMPLICATIONS  Possible complications associated with this procedure include:  Bleeding and risk of blood transfusion. Tell your health care provider if you do not want to receive any blood products.  Blood clots in the legs or lung.  Infection.  Injury to surrounding organs.  Problems or side effects related to anesthesia.  Conversion to an abdominal hysterectomy from one of the other techniques. WHAT TO EXPECT AFTER A HYSTERECTOMY  You will be given pain medicine.  You will need to have someone with you for the first 3-5 days after you go home.  You will need to follow up with your surgeon in 2-4 weeks after surgery to evaluate your progress.  You may have early menopause symptoms such as hot flashes, night sweats, and insomnia.  If you had a hysterectomy for a problem that was not cancer or not a condition that could lead to cancer, then you no longer need Pap tests. However, even if you no longer need a Pap test, a regular exam is a good idea to make sure no other problems are starting.   This information is not intended to replace advice given to you by your health care provider. Make sure you discuss any questions you have  with your health care provider.   Document Released: 10/23/2000 Document Revised: 02/17/2013 Document Reviewed: 01/04/2013 Elsevier Interactive Patient Education [employer].

## 2015-03-29 NOTE — Progress Notes (Signed)
CLINIC ENCOUNTER NOTE  History:  49 y.o. [id number] here today for follow up after evaluation for AUB.  She was prescribed Megace at last visit, this has controlled her bleeding.  She denies any abnormal vaginal discharge, bleeding, pelvic pain or other concerns currently.   Past Medical History  Diagnosis Date  . Abnormal Pap smear 2002    LGSIL  . Hemorrhoid   . Allergy     Past Surgical History  Procedure Laterality Date  . Tubal ligation    . Breast surgery  1994    left breast bx  . Leep  2002  . Dilation and curettage of uterus      TAB  . Colposcopy  2002    ABNORMAL PAP  . Colonoscopy w/ biopsies  2013    divert. cleared for 5 yrs- [facility]    The following portions of the patient's history were reviewed and updated as appropriate: allergies, current medications, past family history, past medical history, past social history, past surgical history and problem list.   Health Maintenance:  Normal pap on 08/10/14.  Normal mammogram on 08/29/14 (report is under Media tab)   Review of Systems:  Pertinent items noted in HPI and remainder of comprehensive ROS otherwise negative.  Objective:  Physical Exam BP 111/72 mmHg  Pulse 60  Resp 18  Ht 5\' 7"  (1.702 m)  Wt 163 lb (73.936 kg)  BMI 25.52 kg/m2  LMP  CONSTITUTIONAL: Well-developed, well-nourished female in no acute distress.  HENT:  Normocephalic, atraumatic. External right and left ear normal. Oropharynx is clear and moist EYES: Conjunctivae and EOM are normal. Pupils are equal, round, and reactive to light. No scleral icterus.  NECK: Normal range of motion, supple, no masses SKIN: Skin is warm and dry. No rash noted. Not diaphoretic. No erythema. No pallor. Lapel: Alert and oriented to person, place, and time. Normal reflexes, muscle tone coordination. No cranial nerve deficit noted. PSYCHIATRIC: Normal mood and affect. Normal behavior. Normal judgment and thought content. CARDIOVASCULAR: Normal heart rate  noted RESPIRATORY: Effort and breath sounds normal, no problems with respiration noted ABDOMEN: Soft, no distention noted.   PELVIC: Deferred MUSCULOSKELETAL: Normal range of motion. No edema noted.  Labs and Imaging  Results for orders placed or performed in visit on 03/15/15 (from the past 504 hour(s))  CBC   Collection Time: 03/15/15 10:10 AM  Result Value Ref Range   WBC 4.9 4.0 - 10.5 K/uL   RBC 3.96 3.87 - 5.11 MIL/uL   Hemoglobin 12.1 12.0 - 15.0 g/dL   HCT 37.1 36.0 - 46.0 %   MCV 93.7 78.0 - 100.0 fL   MCH 30.6 26.0 - 34.0 pg   MCHC 32.6 30.0 - 36.0 g/dL   RDW 13.0 11.5 - 15.5 %   Platelets 228 150 - 400 K/uL   MPV 11.2 8.6 - 0000000 fL  Follicle stimulating hormone   Collection Time: 03/15/15 10:10 AM  Result Value Ref Range   FSH 9.8 mIU/mL  Prolactin   Collection Time: 03/15/15 10:10 AM  Result Value Ref Range   Prolactin 6.1 ng/mL  hCG, quantitative, pregnancy   Collection Time: 03/15/15 10:10 AM  Result Value Ref Range   hCG, Beta Chain, Quant, S <2.0 mIU/mL   03/15/2015 Endometrial biopsy SIMPLE ENDOMETRIAL HYPERPLASIA WITHOUT ATYPIA, NO MALIGNANCY.   03/17/2015 TRANSABDOMINAL AND TRANSVAGINAL ULTRASOUND OF PELVIS   CLINICAL DATA:  Initial encounter for abnormal uterine bleeding. EXAM: TECHNIQUE: Both transabdominal and transvaginal ultrasound examinations of the  pelvis were performed. Transabdominal technique was performed for global imaging of the pelvis including uterus, ovaries, adnexal regions, and pelvic cul-de-sac. It was necessary to proceed with endovaginal exam following the transabdominal exam to visualize the endometrium. COMPARISON:  None FINDINGS: Uterus Measurements: 11.4 x 5.7 x 6.7 cm. Multiple heterogeneous shadowing lesions are seen within the myometrium, suggesting fibroids. These are not well discriminated, but appear more focal than would be expected for adenomyosis. One of the larger lesions appears to be an exophytic fundal subserosal fibroid  measuring up to 5.3 cm. Anterior 3.5 cm fibroid appears to have a submucosal component. Endometrium Thickness: 16-17 mm.  No focal abnormality visualized. Right ovary Measurements: 1.8 x 2.9 x 2.6 cm. Multiple follicles evident. Left ovary Measurements: 4.2 x 2.3 x 4.9 cm. 3.0 x 2.8 x 2.8 cm lesion with internal septation identified. No evidence for papillary excrescence, mural nodularity, or irregular septal thickening. Other findings No free fluid. IMPRESSION: 1. Multiple uterine fibroids including a right anterior lesion that has a submucosal component. 2. 17 mm endometrial stripe thickness. Assuming premenopausal status, if bleeding remains unresponsive to hormonal or medical therapy, focal lesion work-up with sonohysterogram should be considered. Endometrial biopsy should also be considered in pre-menopausal patients at high risk for endometrial carcinoma. (Ref: Radiological Reasoning: Algorithmic Workup of Abnormal Vaginal Bleeding with Endovaginal Sonography and Sonohysterography. AJR 2008; [id number]) 3. 3.0 cm cystic lesion in the left ovary with a single internal septation. This is considered a cyst with indeterminate but probably benign characteristics. Consensus criteria suggest for a cyst of this size and imaging characteristics, no follow-up is necessary in premenopausal patients and a follow-up ultrasound in 1 year is recommended for postmenopausal patients. This recommendation follows the consensus statement: Management of Asymptomatic Ovarian and Other Adnexal Cysts Imaged at [place]: Society of Radiologists in [place]. Radiology 2010; [phone]. Electronically Signed   By: Misty Stanley M.D.   On: 03/17/2015 10:49    Assessment & Plan:  1. Simple endometrial hyperplasia without atypia 2. Abnormal uterine bleeding (AUB) 3. Uterine leiomyoma, unspecified location 4. Complex cyst of left ovary  Discussed management of simple endometrial hyperplasia and risk of  concurrent neoplasia of 1%.  Discussed long term progestin therapy (oral or IUD), need for surveillance endometrial biopsies every six months.  Also discussed option of simple hysterectomy.  Both options were discussed in detail, all questions answered.  Patient desires definitive management with hysterectomy.  I proposed doing a laparoscopic-assisted vaginal hysterectomy (LAVH) and bilateral salpingoophorectomy (she is concerned about the complex left ovarian cyst).  Patient agrees with this proposed surgery.  The risks of surgery were discussed in detail with the patient including but not limited to: bleeding which may require transfusion or reoperation; infection which may require antibiotics; injury to bowel, bladder, ureters or other surrounding organs; need for additional procedures including laparotomy; thromboembolic phenomenon, incisional problems and other postoperative/anesthesia complications.  Patient was also advised that she will remain in house for 1 night; and expected recovery time after a hysterectomy is 6-8 weeks.  Likelihood of success in alleviating the patient's symptoms was discussed.   She was told that she will be contacted by our surgical scheduler regarding the time and date of her surgery; routine preoperative instructions of having nothing to eat or drink after midnight on the day prior to surgery and also coming to the hospital 1.5 hours prior to her time of surgery were also emphasized.  She was told she may be called for a preoperative  appointment about a week prior to surgery and will be given further preoperative instructions at that visit.  Routine postoperative instructions will be reviewed with the patient and her family in detail after surgery.  In the meantime, she will continue Megace; bleeding precautions were reviewed. Printed patient education handouts about the procedure was given to the patient to review at home.   Total face-to-face time with patient: 25 minutes.  Over 50% of encounter was spent on counseling and coordination of care.   Verita Schneiders, MD, [place] Attending Obstetrician & Gynecologist, [place] for [facility]

## 2015-03-29 NOTE — Progress Notes (Signed)
Pt here to follow-up abnormal uterine bleeding, states she is still experiencing some spotting.

## 2015-04-05 ENCOUNTER — Encounter (HOSPITAL_COMMUNITY): Payer: Self-pay | Admitting: *Deleted

## 2015-05-14 HISTORY — PX: VAGINAL HYSTERECTOMY: SUR661

## 2015-05-29 ENCOUNTER — Encounter (HOSPITAL_COMMUNITY): Payer: Self-pay

## 2015-05-29 ENCOUNTER — Encounter (HOSPITAL_COMMUNITY)
Admission: RE | Admit: 2015-05-29 | Discharge: 2015-05-29 | Disposition: A | Discharge status: Home / Self Care | Payer: No Typology Code available for payment source | Source: Ambulatory Visit | Attending: Obstetrics & Gynecology | Admitting: Obstetrics & Gynecology

## 2015-05-29 DIAGNOSIS — N8501 Benign endometrial hyperplasia: Secondary | ICD-10-CM | POA: Diagnosis not present

## 2015-05-29 DIAGNOSIS — N939 Abnormal uterine and vaginal bleeding, unspecified: Secondary | ICD-10-CM | POA: Diagnosis not present

## 2015-05-29 DIAGNOSIS — Z01812 Encounter for preprocedural laboratory examination: Secondary | ICD-10-CM | POA: Diagnosis present

## 2015-05-29 DIAGNOSIS — D259 Leiomyoma of uterus, unspecified: Secondary | ICD-10-CM | POA: Insufficient documentation

## 2015-05-29 HISTORY — DX: Gastro-esophageal reflux disease without esophagitis: K21.9

## 2015-05-29 HISTORY — DX: Hypothyroidism, unspecified: E03.9

## 2015-05-29 HISTORY — DX: Reserved for inherently not codable concepts without codable children: IMO0001

## 2015-05-29 LAB — ABO/RH: ABO/RH(D): A POS

## 2015-05-29 LAB — CBC
HEMATOCRIT: 41.6 % (ref 36.0–46.0)
HEMOGLOBIN: 14.1 g/dL (ref 12.0–15.0)
MCH: 29.9 pg (ref 26.0–34.0)
MCHC: 33.9 g/dL (ref 30.0–36.0)
MCV: 88.1 fL (ref 78.0–100.0)
Platelets: 212 10*3/uL (ref 150–400)
RBC: 4.72 MIL/uL (ref 3.87–5.11)
RDW: 12.9 % (ref 11.5–15.5)
WBC: 10.2 10*3/uL (ref 4.0–10.5)

## 2015-05-29 NOTE — Patient Instructions (Signed)
Your procedure is scheduled on:  June 06, 2015  Enter through the Main Entrance of [facility] at: 7:00 am   Pick up the phone at the desk and dial [phone].  Call this number if you have problems the morning of surgery: [phone].  Remember: Do NOT eat food: after midnight on Monday  Do NOT drink clear liquids after: midnight on Monday  Take these medicines the morning of surgery with a SIP OF WATER: Levothyroxine   Do NOT wear jewelry (body piercing), metal hair clips/bobby pins, make-up, or nail polish. Do NOT wear lotions, powders, or perfumes.  You may wear deoderant. Do NOT shave for 48 hours prior to surgery. Do NOT bring valuables to the hospital. Contacts, dentures, or bridgework may not be worn into surgery. Leave suitcase in car.  After surgery it may be brought to your room.  For patients admitted to the hospital, checkout time is 11:00 AM the day of discharge.

## 2015-05-30 ENCOUNTER — Encounter: Payer: Self-pay | Admitting: *Deleted

## 2015-06-05 MED ORDER — DEXTROSE 5 % IV SOLN
2.0000 g | INTRAVENOUS | Status: AC
  Administered 2015-06-06: 2 g via INTRAVENOUS
  Filled 2015-06-05: qty 2

## 2015-06-05 NOTE — Anesthesia Preprocedure Evaluation (Signed)
Anesthesia Evaluation  Patient identified by MRN, date of birth, ID band Patient awake    Reviewed: Allergy & Precautions, H&P , Patient's Chart, lab work & pertinent test results, reviewed documented beta blocker date and time   Airway Mallampati: II  TM Distance: >3 FB Neck ROM: full    Dental no notable dental hx.    Pulmonary    Pulmonary exam normal breath sounds clear to auscultation       Cardiovascular  Rhythm:regular Rate:Normal     Neuro/Psych    GI/Hepatic GERD  ,  Endo/Other    Renal/GU      Musculoskeletal   Abdominal   Peds  Hematology   Anesthesia Other Findings   Reproductive/Obstetrics                             Anesthesia Physical Anesthesia Plan  ASA: II  Anesthesia Plan: General   Post-op Pain Management:    Induction: Intravenous  Airway Management Planned: Oral ETT  Additional Equipment:   Intra-op Plan:   Post-operative Plan: Extubation in OR  Informed Consent: I have reviewed the patients History and Physical, chart, labs and discussed the procedure including the risks, benefits and alternatives for the proposed anesthesia with the patient or authorized representative who has indicated his/her understanding and acceptance.   Dental Advisory Given and Dental advisory given  Plan Discussed with: CRNA and Surgeon  Anesthesia Plan Comments: (  Discussed general anesthesia, including possible nausea, instrumentation of airway, sore throat,pulmonary aspiration, etc. I asked if the were any outstanding questions, or  concerns before we proceeded.)        Anesthesia Quick Evaluation  

## 2015-06-06 ENCOUNTER — Ambulatory Visit (HOSPITAL_COMMUNITY): Payer: No Typology Code available for payment source | Admitting: Anesthesiology

## 2015-06-06 ENCOUNTER — Encounter (HOSPITAL_COMMUNITY)
Admission: AD | Disposition: A | Discharge status: Home / Self Care | Payer: Self-pay | Source: Ambulatory Visit | Attending: Obstetrics & Gynecology

## 2015-06-06 ENCOUNTER — Inpatient Hospital Stay (HOSPITAL_COMMUNITY)
Admission: AD | Admit: 2015-06-06 | Discharge: 2015-06-08 | DRG: 742 | Disposition: A | Discharge status: Home / Self Care | Payer: No Typology Code available for payment source | Source: Ambulatory Visit | Attending: Obstetrics & Gynecology | Admitting: Obstetrics & Gynecology

## 2015-06-06 ENCOUNTER — Encounter (HOSPITAL_COMMUNITY): Payer: Self-pay

## 2015-06-06 DIAGNOSIS — D259 Leiomyoma of uterus, unspecified: Principal | ICD-10-CM | POA: Diagnosis present

## 2015-06-06 DIAGNOSIS — N8501 Benign endometrial hyperplasia: Secondary | ICD-10-CM | POA: Diagnosis present

## 2015-06-06 DIAGNOSIS — N939 Abnormal uterine and vaginal bleeding, unspecified: Secondary | ICD-10-CM | POA: Diagnosis present

## 2015-06-06 DIAGNOSIS — N83202 Unspecified ovarian cyst, left side: Secondary | ICD-10-CM | POA: Diagnosis present

## 2015-06-06 DIAGNOSIS — N83209 Unspecified ovarian cyst, unspecified side: Secondary | ICD-10-CM | POA: Diagnosis present

## 2015-06-06 DIAGNOSIS — D219 Benign neoplasm of connective and other soft tissue, unspecified: Secondary | ICD-10-CM | POA: Diagnosis present

## 2015-06-06 DIAGNOSIS — Z90710 Acquired absence of both cervix and uterus: Secondary | ICD-10-CM | POA: Diagnosis present

## 2015-06-06 DIAGNOSIS — E039 Hypothyroidism, unspecified: Secondary | ICD-10-CM | POA: Diagnosis present

## 2015-06-06 DIAGNOSIS — D62 Acute posthemorrhagic anemia: Secondary | ICD-10-CM | POA: Diagnosis not present

## 2015-06-06 HISTORY — PX: LAPAROSCOPIC VAGINAL HYSTERECTOMY WITH SALPINGO OOPHORECTOMY: SHX6681

## 2015-06-06 HISTORY — PX: CYSTOSCOPY: SHX5120

## 2015-06-06 LAB — COMPREHENSIVE METABOLIC PANEL
ALT: 7 U/L — ABNORMAL LOW (ref 14–54)
ANION GAP: 6 (ref 5–15)
AST: 15 U/L (ref 15–41)
Albumin: 2.5 g/dL — ABNORMAL LOW (ref 3.5–5.0)
Alkaline Phosphatase: 29 U/L — ABNORMAL LOW (ref 38–126)
BILIRUBIN TOTAL: 0.4 mg/dL (ref 0.3–1.2)
BUN: 10 mg/dL (ref 6–20)
CO2: 24 mmol/L (ref 22–32)
Calcium: 7.9 mg/dL — ABNORMAL LOW (ref 8.9–10.3)
Chloride: 109 mmol/L (ref 101–111)
Creatinine, Ser: 0.94 mg/dL (ref 0.44–1.00)
GFR calc Af Amer: >60 mL/min (ref >60)
Glucose, Bld: 130 mg/dL — ABNORMAL HIGH (ref 65–99)
POTASSIUM: 4 mmol/L (ref 3.5–5.1)
Sodium: 139 mmol/L (ref 135–145)
TOTAL PROTEIN: 4.4 g/dL — AB (ref 6.5–8.1)

## 2015-06-06 LAB — PREGNANCY, URINE: Preg Test, Ur: NEGATIVE

## 2015-06-06 LAB — CBC
HEMATOCRIT: 25.2 % — AB (ref 36.0–46.0)
Hemoglobin: 8.5 g/dL — ABNORMAL LOW (ref 12.0–15.0)
MCH: 29.7 pg (ref 26.0–34.0)
MCHC: 33.7 g/dL (ref 30.0–36.0)
MCV: 88.1 fL (ref 78.0–100.0)
Platelets: 124 10*3/uL — ABNORMAL LOW (ref 150–400)
RBC: 2.86 MIL/uL — ABNORMAL LOW (ref 3.87–5.11)
RDW: 13.5 % (ref 11.5–15.5)
WBC: 13.8 10*3/uL — ABNORMAL HIGH (ref 4.0–10.5)

## 2015-06-06 LAB — DIC (DISSEMINATED INTRAVASCULAR COAGULATION)PANEL
D-Dimer, Quant: 2.06 ug/mL-FEU — ABNORMAL HIGH (ref 0.00–0.50)
Fibrinogen: 237 mg/dL (ref 204–475)
Platelets: 127 10*3/uL — ABNORMAL LOW (ref 150–400)
Prothrombin Time: 15.9 seconds — ABNORMAL HIGH (ref 11.6–15.2)

## 2015-06-06 LAB — PREPARE RBC (CROSSMATCH)

## 2015-06-06 LAB — DIC (DISSEMINATED INTRAVASCULAR COAGULATION) PANEL
APTT: 24 s (ref 24–37)
INR: 1.25 (ref 0.00–1.49)
SMEAR REVIEW: NONE SEEN

## 2015-06-06 SURGERY — HYSTERECTOMY, VAGINAL, LAPAROSCOPY-ASSISTED, WITH SALPINGO-OOPHORECTOMY
Anesthesia: General | Site: Vagina

## 2015-06-06 MED ORDER — SODIUM CHLORIDE 0.9 % IJ SOLN
INTRAMUSCULAR | Status: AC
  Filled 2015-06-06: qty 10

## 2015-06-06 MED ORDER — LIDOCAINE HCL (CARDIAC) 20 MG/ML IV SOLN
INTRAVENOUS | Status: AC
  Filled 2015-06-06: qty 5

## 2015-06-06 MED ORDER — HYDROMORPHONE HCL 1 MG/ML IJ SOLN: 0.2500 mg | INTRAMUSCULAR | Status: DC | PRN

## 2015-06-06 MED ORDER — FENTANYL CITRATE (PF) 100 MCG/2ML IJ SOLN
INTRAMUSCULAR | Status: DC | PRN
  Administered 2015-06-06 (×2): 50 ug via INTRAVENOUS

## 2015-06-06 MED ORDER — BUPIVACAINE-EPINEPHRINE (PF) 0.5% -1:200000 IJ SOLN
INTRAMUSCULAR | Status: AC
  Filled 2015-06-06: qty 30

## 2015-06-06 MED ORDER — LORATADINE 10 MG PO TABS
10.0000 mg | ORAL_TABLET | Freq: Every day | ORAL | Status: DC | PRN
  Filled 2015-06-06: qty 1

## 2015-06-06 MED ORDER — BUPIVACAINE-EPINEPHRINE 0.5% -1:200000 IJ SOLN
INTRAMUSCULAR | Status: DC | PRN
  Administered 2015-06-06: 27 mL

## 2015-06-06 MED ORDER — PROPOFOL 10 MG/ML IV BOLUS
INTRAVENOUS | Status: DC | PRN
  Administered 2015-06-06: 40 mg via INTRAVENOUS
  Administered 2015-06-06: 160 mg via INTRAVENOUS

## 2015-06-06 MED ORDER — LACTATED RINGERS IV SOLN: INTRAVENOUS | Status: DC

## 2015-06-06 MED ORDER — ALUM & MAG HYDROXIDE-SIMETH 200-200-20 MG/5ML PO SUSP
30.0000 mL | ORAL | Status: DC | PRN
  Filled 2015-06-06: qty 30

## 2015-06-06 MED ORDER — DOCUSATE SODIUM 100 MG PO CAPS
100.0000 mg | ORAL_CAPSULE | Freq: Two times a day (BID) | ORAL | Status: DC
  Administered 2015-06-06 – 2015-06-08 (×4): 100 mg via ORAL
  Filled 2015-06-06 (×4): qty 1

## 2015-06-06 MED ORDER — PHENYLEPHRINE 40 MCG/ML (10ML) SYRINGE FOR IV PUSH (FOR BLOOD PRESSURE SUPPORT)
PREFILLED_SYRINGE | INTRAVENOUS | Status: AC
  Filled 2015-06-06: qty 10

## 2015-06-06 MED ORDER — ONDANSETRON HCL 4 MG PO TABS: 4.0000 mg | ORAL_TABLET | Freq: Four times a day (QID) | ORAL | Status: DC | PRN

## 2015-06-06 MED ORDER — LIDOCAINE HCL (CARDIAC) 20 MG/ML IV SOLN
INTRAVENOUS | Status: DC | PRN
  Administered 2015-06-06: 80 mg via INTRAVENOUS

## 2015-06-06 MED ORDER — HYDROMORPHONE 1 MG/ML IV SOLN: INTRAVENOUS | Status: DC

## 2015-06-06 MED ORDER — NALOXONE HCL 0.4 MG/ML IJ SOLN: 0.4000 mg | INTRAMUSCULAR | Status: DC | PRN

## 2015-06-06 MED ORDER — NEOSTIGMINE METHYLSULFATE 10 MG/10ML IV SOLN
INTRAVENOUS | Status: DC | PRN
  Administered 2015-06-06: 2.5 mg via INTRAVENOUS

## 2015-06-06 MED ORDER — FUROSEMIDE 10 MG/ML IJ SOLN
10.0000 mg | Freq: Once | INTRAMUSCULAR | Status: AC
  Administered 2015-06-06: 10 mg via INTRAVENOUS

## 2015-06-06 MED ORDER — ROCURONIUM BROMIDE 100 MG/10ML IV SOLN
INTRAVENOUS | Status: DC | PRN
  Administered 2015-06-06: 10 mg via INTRAVENOUS
  Administered 2015-06-06: 40 mg via INTRAVENOUS
  Administered 2015-06-06 (×2): 10 mg via INTRAVENOUS

## 2015-06-06 MED ORDER — PHENYLEPHRINE HCL 10 MG/ML IJ SOLN
INTRAMUSCULAR | Status: DC | PRN
  Administered 2015-06-06: 80 ug via INTRAVENOUS
  Administered 2015-06-06: 40 ug via INTRAVENOUS
  Administered 2015-06-06: 80 ug via INTRAVENOUS

## 2015-06-06 MED ORDER — OXYCODONE-ACETAMINOPHEN 5-325 MG PO TABS
1.0000 | ORAL_TABLET | ORAL | Status: DC | PRN
  Administered 2015-06-06: 2 via ORAL
  Administered 2015-06-07 (×2): 1 via ORAL
  Administered 2015-06-07: 2 via ORAL
  Filled 2015-06-06: qty 2
  Filled 2015-06-06 (×2): qty 1
  Filled 2015-06-06: qty 2

## 2015-06-06 MED ORDER — GLYCOPYRROLATE 0.2 MG/ML IJ SOLN
INTRAMUSCULAR | Status: DC | PRN
  Administered 2015-06-06: 0.1 mg via INTRAVENOUS
  Administered 2015-06-06: .5 mg via INTRAVENOUS

## 2015-06-06 MED ORDER — LEVOTHYROXINE SODIUM 25 MCG PO TABS
25.0000 ug | ORAL_TABLET | Freq: Every day | ORAL | Status: DC
  Administered 2015-06-07 – 2015-06-08 (×2): 25 ug via ORAL
  Filled 2015-06-06 (×2): qty 1

## 2015-06-06 MED ORDER — FENTANYL CITRATE (PF) 250 MCG/5ML IJ SOLN
INTRAMUSCULAR | Status: DC | PRN
  Administered 2015-06-06: 50 ug via INTRAVENOUS
  Administered 2015-06-06: 100 ug via INTRAVENOUS

## 2015-06-06 MED ORDER — SCOPOLAMINE 1 MG/3DAYS TD PT72
MEDICATED_PATCH | TRANSDERMAL | Status: AC
  Administered 2015-06-06: 1.5 mg via TRANSDERMAL
  Filled 2015-06-06: qty 1

## 2015-06-06 MED ORDER — CITRIC ACID-SODIUM CITRATE 334-500 MG/5ML PO SOLN
ORAL | Status: AC
  Administered 2015-06-06: 30 mL via ORAL
  Filled 2015-06-06: qty 15

## 2015-06-06 MED ORDER — LACTATED RINGERS IV SOLN
INTRAVENOUS | Status: DC
  Administered 2015-06-06 (×2): via INTRAVENOUS
  Administered 2015-06-06 (×2): 125 mL/h via INTRAVENOUS
  Administered 2015-06-06 (×3): via INTRAVENOUS

## 2015-06-06 MED ORDER — SODIUM CHLORIDE 0.9 % IV SOLN
Freq: Once | INTRAVENOUS | Status: AC
  Administered 2015-06-06: 13:00:00 via INTRAVENOUS

## 2015-06-06 MED ORDER — NEOSTIGMINE METHYLSULFATE 10 MG/10ML IV SOLN
INTRAVENOUS | Status: AC
  Filled 2015-06-06: qty 1

## 2015-06-06 MED ORDER — KETOROLAC TROMETHAMINE 30 MG/ML IJ SOLN: 30.0000 mg | Freq: Once | INTRAMUSCULAR | Status: DC

## 2015-06-06 MED ORDER — BUPIVACAINE HCL (PF) 0.5 % IJ SOLN
INTRAMUSCULAR | Status: AC
  Filled 2015-06-06: qty 30

## 2015-06-06 MED ORDER — MAGNESIUM CITRATE PO SOLN: 1.0000 | Freq: Once | ORAL | Status: DC | PRN

## 2015-06-06 MED ORDER — MIDAZOLAM HCL 2 MG/2ML IJ SOLN
INTRAMUSCULAR | Status: AC
  Filled 2015-06-06: qty 2

## 2015-06-06 MED ORDER — FUROSEMIDE 10 MG/ML IJ SOLN
INTRAMUSCULAR | Status: AC
  Administered 2015-06-06: 10 mg via INTRAVENOUS
  Filled 2015-06-06: qty 2

## 2015-06-06 MED ORDER — ACETAMINOPHEN 160 MG/5ML PO SOLN
ORAL | Status: AC
  Administered 2015-06-06: 975 mg via ORAL
  Filled 2015-06-06: qty 40.6

## 2015-06-06 MED ORDER — PANTOPRAZOLE SODIUM 40 MG PO TBEC
40.0000 mg | DELAYED_RELEASE_TABLET | Freq: Every day | ORAL | Status: DC
  Administered 2015-06-07 – 2015-06-08 (×2): 40 mg via ORAL
  Filled 2015-06-06 (×2): qty 1

## 2015-06-06 MED ORDER — HYDROMORPHONE HCL 1 MG/ML IJ SOLN
INTRAMUSCULAR | Status: AC
  Filled 2015-06-06: qty 1

## 2015-06-06 MED ORDER — BISACODYL 10 MG RE SUPP: 10.0000 mg | Freq: Every day | RECTAL | Status: DC | PRN

## 2015-06-06 MED ORDER — METHYLENE BLUE 1 % INJ SOLN
INTRAMUSCULAR | Status: DC | PRN
  Administered 2015-06-06: 30 mg via INTRAVENOUS
  Administered 2015-06-06: 20 mg via INTRAVENOUS

## 2015-06-06 MED ORDER — CITRIC ACID-SODIUM CITRATE 334-500 MG/5ML PO SOLN
30.0000 mL | ORAL | Status: AC
  Administered 2015-06-06: 30 mL via ORAL

## 2015-06-06 MED ORDER — DIPHENHYDRAMINE HCL 12.5 MG/5ML PO ELIX
12.5000 mg | ORAL_SOLUTION | Freq: Four times a day (QID) | ORAL | Status: DC | PRN
  Filled 2015-06-06: qty 5

## 2015-06-06 MED ORDER — GLYCOPYRROLATE 0.2 MG/ML IJ SOLN
INTRAMUSCULAR | Status: AC
  Filled 2015-06-06: qty 1

## 2015-06-06 MED ORDER — HYDROMORPHONE HCL 1 MG/ML IJ SOLN
0.2000 mg | INTRAMUSCULAR | Status: DC | PRN
  Administered 2015-06-06 – 2015-06-07 (×2): 0.4 mg via INTRAVENOUS
  Filled 2015-06-06 (×2): qty 1

## 2015-06-06 MED ORDER — ACETAMINOPHEN 160 MG/5ML PO SOLN
975.0000 mg | Freq: Once | ORAL | Status: AC
  Administered 2015-06-06: 975 mg via ORAL

## 2015-06-06 MED ORDER — SCOPOLAMINE 1 MG/3DAYS TD PT72
1.0000 | MEDICATED_PATCH | TRANSDERMAL | Status: DC
  Administered 2015-06-06: 1.5 mg via TRANSDERMAL

## 2015-06-06 MED ORDER — ONDANSETRON HCL 4 MG/2ML IJ SOLN
INTRAMUSCULAR | Status: AC
  Filled 2015-06-06: qty 2

## 2015-06-06 MED ORDER — SODIUM CHLORIDE 0.9% FLUSH: 9.0000 mL | INTRAVENOUS | Status: DC | PRN

## 2015-06-06 MED ORDER — DIPHENHYDRAMINE HCL 50 MG/ML IJ SOLN: 12.5000 mg | Freq: Four times a day (QID) | INTRAMUSCULAR | Status: DC | PRN

## 2015-06-06 MED ORDER — DEXAMETHASONE SODIUM PHOSPHATE 4 MG/ML IJ SOLN
INTRAMUSCULAR | Status: DC | PRN
  Administered 2015-06-06: 10 mg via INTRAVENOUS

## 2015-06-06 MED ORDER — ACETAMINOPHEN 10 MG/ML IV SOLN
1000.0000 mg | Freq: Once | INTRAVENOUS | Status: AC
  Administered 2015-06-06: 1000 mg via INTRAVENOUS
  Filled 2015-06-06: qty 100

## 2015-06-06 MED ORDER — ONDANSETRON HCL 4 MG/2ML IJ SOLN
INTRAMUSCULAR | Status: DC | PRN
  Administered 2015-06-06: 4 mg via INTRAVENOUS

## 2015-06-06 MED ORDER — BUPIVACAINE HCL (PF) 0.5 % IJ SOLN
INTRAMUSCULAR | Status: DC | PRN
  Administered 2015-06-06: 16 mL

## 2015-06-06 MED ORDER — ZOLPIDEM TARTRATE 5 MG PO TABS: 5.0000 mg | ORAL_TABLET | Freq: Every evening | ORAL | Status: DC | PRN

## 2015-06-06 MED ORDER — DEXAMETHASONE SODIUM PHOSPHATE 10 MG/ML IJ SOLN
INTRAMUSCULAR | Status: AC
  Filled 2015-06-06: qty 1

## 2015-06-06 MED ORDER — GLYCOPYRROLATE 0.2 MG/ML IJ SOLN
INTRAMUSCULAR | Status: AC
  Filled 2015-06-06: qty 2

## 2015-06-06 MED ORDER — IBUPROFEN 600 MG PO TABS
600.0000 mg | ORAL_TABLET | Freq: Four times a day (QID) | ORAL | Status: DC | PRN
  Administered 2015-06-07 (×2): 600 mg via ORAL
  Filled 2015-06-06 (×2): qty 1

## 2015-06-06 MED ORDER — HYDROMORPHONE HCL 1 MG/ML IJ SOLN
INTRAMUSCULAR | Status: DC | PRN
  Administered 2015-06-06: 1 mg via INTRAVENOUS

## 2015-06-06 MED ORDER — METHYLENE BLUE 1 % INJ SOLN
INTRAMUSCULAR | Status: AC
  Filled 2015-06-06: qty 1

## 2015-06-06 MED ORDER — FENTANYL CITRATE (PF) 250 MCG/5ML IJ SOLN
INTRAMUSCULAR | Status: AC
  Filled 2015-06-06: qty 5

## 2015-06-06 MED ORDER — ONDANSETRON HCL 4 MG/2ML IJ SOLN
4.0000 mg | Freq: Four times a day (QID) | INTRAMUSCULAR | Status: DC | PRN
  Administered 2015-06-07: 4 mg via INTRAVENOUS
  Filled 2015-06-06: qty 2

## 2015-06-06 MED ORDER — SIMETHICONE 80 MG PO CHEW: 80.0000 mg | CHEWABLE_TABLET | Freq: Four times a day (QID) | ORAL | Status: DC | PRN

## 2015-06-06 MED ORDER — PROPOFOL 10 MG/ML IV BOLUS
INTRAVENOUS | Status: AC
  Filled 2015-06-06: qty 20

## 2015-06-06 MED ORDER — SENNOSIDES-DOCUSATE SODIUM 8.6-50 MG PO TABS: 1.0000 | ORAL_TABLET | Freq: Every evening | ORAL | Status: DC | PRN

## 2015-06-06 MED ORDER — ROCURONIUM BROMIDE 100 MG/10ML IV SOLN
INTRAVENOUS | Status: AC
  Filled 2015-06-06: qty 1

## 2015-06-06 MED ORDER — MENTHOL 3 MG MT LOZG: 1.0000 | LOZENGE | OROMUCOSAL | Status: DC | PRN

## 2015-06-06 MED ORDER — MIDAZOLAM HCL 2 MG/2ML IJ SOLN
INTRAMUSCULAR | Status: DC | PRN
  Administered 2015-06-06: 1 mg via INTRAVENOUS

## 2015-06-06 SURGICAL SUPPLY — 46 items
APPLICATOR COTTON TIP 6IN STRL (MISCELLANEOUS) ×3 IMPLANT
CABLE HIGH FREQUENCY MONO STRZ (ELECTRODE) IMPLANT
CANISTER SUCT 3000ML (MISCELLANEOUS) ×3 IMPLANT
CLOTH BEACON ORANGE TIMEOUT ST (SAFETY) ×3 IMPLANT
CONT PATH 16OZ SNAP LID 3702 (MISCELLANEOUS) ×3 IMPLANT
COVER BACK TABLE 60X90IN (DRAPES) ×3 IMPLANT
DECANTER SPIKE VIAL GLASS SM (MISCELLANEOUS) ×6 IMPLANT
DRSG COVADERM PLUS 2X2 (GAUZE/BANDAGES/DRESSINGS) ×2 IMPLANT
DRSG OPSITE POSTOP 3X4 (GAUZE/BANDAGES/DRESSINGS) ×3 IMPLANT
DURAPREP 26ML APPLICATOR (WOUND CARE) ×3 IMPLANT
ELECT REM PT RETURN 9FT ADLT (ELECTROSURGICAL) ×3
ELECTRODE REM PT RTRN 9FT ADLT (ELECTROSURGICAL) ×1 IMPLANT
EVACUATOR SMOKE 8.L (FILTER) ×3 IMPLANT
GAUZE SPONGE 4X4 16PLY XRAY LF (GAUZE/BANDAGES/DRESSINGS) ×3 IMPLANT
GLOVE BIOGEL PI IND STRL 6.5 (GLOVE) ×2 IMPLANT
GLOVE BIOGEL PI IND STRL 7.0 (GLOVE) ×8 IMPLANT
GLOVE BIOGEL PI INDICATOR 6.5 (GLOVE) ×1
GLOVE BIOGEL PI INDICATOR 7.0 (GLOVE) ×4
GLOVE ECLIPSE 7.0 STRL STRAW (GLOVE) ×6 IMPLANT
LEGGING LITHOTOMY PAIR STRL (DRAPES) ×3 IMPLANT
LIQUID BAND (GAUZE/BANDAGES/DRESSINGS) ×2 IMPLANT
NDL MAYO CATGUT SZ4 TPR NDL (NEEDLE) IMPLANT
NEEDLE MAYO CATGUT SZ4 (NEEDLE) IMPLANT
NS IRRIG 1000ML POUR BTL (IV SOLUTION) ×3 IMPLANT
PACK LAVH (CUSTOM PROCEDURE TRAY) ×3 IMPLANT
PACK ROBOTIC GOWN (GOWN DISPOSABLE) ×3 IMPLANT
PAD TRENDELENBURG POSITION (MISCELLANEOUS) ×3 IMPLANT
SET CYSTO W/LG BORE CLAMP LF (SET/KITS/TRAYS/PACK) ×1 IMPLANT
SET IRRIG TUBING LAPAROSCOPIC (IRRIGATION / IRRIGATOR) IMPLANT
SHEARS FOC LG CVD HARMONIC 17C (MISCELLANEOUS) IMPLANT
SHEARS HARMONIC ACE PLUS 36CM (ENDOMECHANICALS) ×4 IMPLANT
SLEEVE XCEL OPT CAN 5 100 (ENDOMECHANICALS) ×3 IMPLANT
SPONGE LAP 18X18 X RAY DECT (DISPOSABLE) ×1 IMPLANT
SUT VIC AB 0 CT1 18XCR BRD8 (SUTURE) ×4 IMPLANT
SUT VIC AB 0 CT1 36 (SUTURE) ×3 IMPLANT
SUT VIC AB 0 CT1 8-18 (SUTURE) ×6
SUT VICRYL 0 TIES 12 18 (SUTURE) ×3 IMPLANT
SUT VICRYL 0 UR6 27IN ABS (SUTURE) ×3 IMPLANT
SUT VICRYL 4-0 PS2 18IN ABS (SUTURE) ×6 IMPLANT
SYR BULB 3OZ (MISCELLANEOUS) ×1 IMPLANT
TOWEL OR 17X24 6PK STRL BLUE (TOWEL DISPOSABLE) ×9 IMPLANT
TRAY FOLEY CATH SILVER 14FR (SET/KITS/TRAYS/PACK) ×3 IMPLANT
TROCAR XCEL NON-BLD 11X100MML (ENDOMECHANICALS) ×2 IMPLANT
TROCAR XCEL NON-BLD 5MMX100MML (ENDOMECHANICALS) ×3 IMPLANT
WARMER LAPAROSCOPE (MISCELLANEOUS) ×3 IMPLANT
WATER STERILE IRR 1000ML POUR (IV SOLUTION) ×2 IMPLANT

## 2015-06-06 NOTE — Op Note (Signed)
[name] PROCEDURE DATE: 06/06/2015   PREOPERATIVE DIAGNOSES: Abnormal uterine bleeding, fibroids  POSTOPERATIVE DIAGNOSES: The same PROCEDURE: Laparoscopic assisted vaginal hysterectomy, bilateral salpingectomy, cystoscopy SURGEON:  Dr. Verita Schneiders ASSISTANT: Dr. Lavonia Drafts   INDICATIONS: 50 y.o. [id number] with history of fibroids and AUB desiring definitive surgical management.  Risks of surgery were discussed with the patient including but not limited to: bleeding which may require transfusion or reoperation; infection which may require antibiotics; injury to bowel, bladder, ureters or other surrounding organs; need for additional procedures including laparotomy; thromboembolic phenomenon, incisional problems and other postoperative/anesthesia complications. Written informed consent was obtained.    FINDINGS:  Large fibroid uterus (weighed 722 g in OR) with large penduculated fibroid, normal adnexa bilaterally.  No evidence of endometriosis, adhesions or any other abdominal/pelvic abnormality.  Normal upper abdomen. Normal ureters and bladder on cystoscopy.  ANESTHESIA:    General INTRAVENOUS FLUIDS: 4000 ml ESTIMATED BLOOD LOSS: 1100 ml SPECIMENS: Uterus, cervix, bilateral fallopian tubes. COMPLICATIONS: Large volume of blood loss needing transfusion  PROCEDURE IN DETAIL:  The patient received intravenous antibiotics and had sequential compression devices applied to her lower extremities while in the preoperative area.  She was then taken to the operating room where general anesthesia was administered and was found to be adequate.  She was placed in the dorsal lithotomy position, and was prepped and draped in a sterile manner.  A Foley catheter was inserted into her bladder and attached to constant drainage and a uterine manipulator was then advanced into the uterus .  After an adequate timeout was performed, attention was turned to the abdomen where an umbilical incision  was made with the scalpel.  The Optiview 11-mm trocar and sleeve were then advanced without difficulty with the laparoscope under direct visualization into the abdomen.  The abdomen was then insufflated with carbon dioxide gas and adequate pneumoperitoneum was obtained.  Bilateral 5-mm lower quadrant ports were then placed under direct visualization.  A survey of the patient's pelvis and abdomen revealed the findings as above.    On the right side, the round and broad ligaments were clamped and transected with the Harmonic device.  The fallopian tubes were freed from the underlying mesosalpinges.  The uterine artery was then skeletonized and a bladder flap was created. These procedures were then repeated on the left side.   The ureters were noted to be safely away from the area of dissection. The bladder was then bluntly dissected off the lower uterine segment.  At this point, attention was turned to the uterine vessels, which were clamped, coagulated and transected using the Harmonic device.  There was significant amount of bleeding noted, which was initially controlled using the Harmonic. The decision was made to leave the trocars in place and proceed with completing the hysterectomy via the vaginal route .  Attention was then turned to her pelvis.  A weighted speculum was then placed in the vagina, and the anterior and posterior lips of the cervix were grasped bilaterally with tenaculums.  The cervix was then injected circumferentially with 0.5% Marcaine with epinephrine solution to maintain hemostasis.  The cervix was then circumferentially incised, and the anterior cul-de-sac was entered bluntly without difficulty and a retractor was placed.  The same procedure was performed posteriorly and the posterior cul-de-sac was entered sharply without difficulty.  A long weighted speculum was inserted into the posterior cul-de-sac which was noted to have a significant amount of blood and clots.  The Heaney clamp was  then used  to clamp the uterosacral ligaments on either side.  Of note, all sutures used in this case were 0 Vicryl unless otherwise noted.   The cardinal ligaments were then clamped, cut and ligated bilaterally.  Significant bleeding was noted and the uterine vessels were re- clamped, cut and suture ligated.  The uterus was noted to be freed from all ligaments and was then delivered and sent to pathology.   After completion of the hysterectomy, all pedicles from the uterosacral ligament to the cornua were examined and a bleeding vessel on the left side wall was suture ligated.  Hemostasis was confirmed.  At this point, we were informed there was no urine output and there was initial concern about a bladder injury given the volume of blood noted during the case.  Laparoscopic thorough evaluation and vaginal evaluation did not show any injury, even after intravenous methylene blue administration and backfill of the bladder with methylene blue stained fluid.  Furthermore, cystoscopy was performed  which showed bilateral ureteral jets no bladder injury.  The vaginal cuff was then closed in a running locked fashion with 0 Vicryl with care given to incorporate the uterosacral pedicles bilaterally.  All instruments were then removed from the pelvis.   Attention was then returned to her abdomen which was insufflated again with carbon dioxide gas.  The laparoscope was used to survey the operative site, and it was found to be hemostatic.  The bladder was also evaluated and no intraperitoneal leakage was seen.  No intraoperative injury to other surrounding organs was noted.  The abdomen was desufflated and all instruments were then removed from the patient's abdomen.  The fascial incision of the umbilicus was closed with a 0 Vicryl figure of eight stitch.  All skin incisions were closed with Dermabond. The patient tolerated the procedures well.  All instruments, needles, and sponge counts were correct x 2. The patient was  taken to the recovery room awake, extubated and in stable condition.   Will continue close observation of patient given large amount of blood loss.   Verita Schneiders, MD, [place] Attending Obstetrician & Gynecologist, [place] for [facility]

## 2015-06-06 NOTE — Progress Notes (Signed)
Faculty Practice OB/GYN Attending Note (late entry)  Subjective:  Called to evaluate patient with decreased UOP, looking very pale.  Stable vital signs.  CBC has been ordered. No evidence of ongoing bleeding.    Objective:  Blood pressure 86/511, pulse 60, temperature 98.5 F (36.9 C), temperature source Oral, resp. rate 16, SpO2 100 %. Gen: NAD, Appears very pale, nontoxic Lungs: Normal respiratory effort Heart: Regular rate noted Abdomen: NT,, soft Pelvic: Minimal vaginal bleeding Ext: 2+ DTRs, no edema  Results for orders placed or performed during the hospital encounter of 06/06/15 (from the past 24 hour(s))  Pregnancy, urine     Status: None   Collection Time: 06/06/15  7:00 AM  Result Value Ref Range   Preg Test, Ur NEGATIVE NEGATIVE  CBC     Status: Abnormal   Collection Time: 06/06/15 12:20 PM  Result Value Ref Range   WBC 13.8 (H) 4.0 - 10.5 K/uL   RBC 2.86 (L) 3.87 - 5.11 MIL/uL   Hemoglobin 8.5 (L) 12.0 - 15.0 g/dL   HCT 25.2 (L) 36.0 - 46.0 %   MCV 88.1 78.0 - 100.0 fL   MCH 29.7 26.0 - 34.0 pg   MCHC 33.7 30.0 - 36.0 g/dL   RDW 13.5 11.5 - 15.5 %   Platelets 124 (L) 150 - 400 K/uL  Comprehensive metabolic panel     Status: Abnormal   Collection Time: 06/06/15 12:20 PM  Result Value Ref Range   Sodium 139 135 - 145 mmol/L   Potassium 4.0 3.5 - 5.1 mmol/L   Chloride 109 101 - 111 mmol/L   CO2 24 22 - 32 mmol/L   Glucose, Bld 130 (H) 65 - 99 mg/dL   BUN 10 6 - 20 mg/dL   Creatinine, Ser 0.94 0.44 - 1.00 mg/dL   Calcium 7.9 (L) 8.9 - 10.3 mg/dL   Total Protein 4.4 (L) 6.5 - 8.1 g/dL   Albumin 2.5 (L) 3.5 - 5.0 g/dL   AST 15 15 - 41 U/L   ALT 7 (L) 14 - 54 U/L   Alkaline Phosphatase 29 (L) 38 - 126 U/L   Total Bilirubin 0.4 0.3 - 1.2 mg/dL   GFR calc non Af Amer >60 >60 mL/min   GFR calc Af Amer >60 >60 mL/min   Anion gap 6 5 - 15  DIC (disseminated intravasc coag) panel     Status: Abnormal   Collection Time: 06/06/15 12:30 PM  Result Value Ref Range   Prothrombin Time 15.9 (H) 11.6 - 15.2 seconds   INR 1.25 0.00 - 1.49   aPTT 24 24 - 37 seconds   Fibrinogen 237 204 - 475 mg/dL   D-Dimer, Quant 2.06 (H) 0.00 - 0.50 ug/mL-FEU   Platelets 127 (L) 150 - 400 K/uL   Smear Review NO SCHISTOCYTES SEEN   Prepare RBC     Status: None   Collection Time: 06/06/15 12:31 PM  Result Value Ref Range   Order Confirmation ORDER PROCESSED BY BLOOD BANK    CBC Latest Ref Rng 06/06/2015 06/06/2015 05/29/2015  WBC 4.0 - 10.5 K/uL - 13.8(H) 10.2  Hemoglobin 12.0 - 15.0 g/dL - 8.5(L) 14.1  Hematocrit 36.0 - 46.0 % - 25.2(L) 41.6  Platelets 150 - 400 K/uL 127(L) 124(L) 212      Assessment & Plan:  50 y.o. [id number] s/p LAVH, BS complicated by large EBL. No active bleeding. - Will transfuse three units of pRBCs - Continue strict Is and Os - Continue AICU observation for  now - Appreciate Anesthesiology and PACU team help in caring for this patient    Verita Schneiders, MD, [place] Attending [place], [facility]

## 2015-06-06 NOTE — Anesthesia Postprocedure Evaluation (Signed)
Anesthesia Post Note  Patient: [name]  Procedure(s) Performed: Procedure(s) (LRB): LAPAROSCOPIC ASSISTED VAGINAL HYSTERECTOMY  (N/A) CYSTOSCOPY  Patient location during evaluation: PACU Anesthesia Type: General Level of consciousness: awake Pain management: pain level controlled Vital Signs Assessment: post-procedure vital signs reviewed and stable Respiratory status: spontaneous breathing Cardiovascular status: stable Postop Assessment: no signs of nausea or vomiting Anesthetic complications: no Comments: The patient will be discharged from the PACU to the AICU when a nurse becomes available for one to one observation she could not be guaranteed on the floor given her 6g drop in hemoglobin with a reported 1100cc blood loss.    Last Vitals:  Filed Vitals:   06/06/15 1615 06/06/15 1700  BP: 109/61 117/65  Pulse: 60 66  Temp:    Resp: 16 16    Last Pain:  Filed Vitals:   06/06/15 1706  PainSc: 0-No pain                 Tionne Dayhoff JR,JOHN Garon Melander

## 2015-06-06 NOTE — OR Nursing (Signed)
bil sclera edema is significantly less with at least 1100 cc of urine output in 30 mins since IV Lasix. Kristine Royal, RN

## 2015-06-06 NOTE — Transfer of Care (Signed)
Immediate Anesthesia Transfer of Care Note  Patient: [name]  Procedure(s) Performed: Procedure(s) with comments: LAPAROSCOPIC ASSISTED VAGINAL HYSTERECTOMY  (N/A) - 722.9grams  CYSTOSCOPY  Patient Location: PACU  Anesthesia Type:General  Level of Consciousness: awake, alert  and oriented  Airway & Oxygen Therapy: Patient Spontanous Breathing and Patient connected to nasal cannula oxygen  Post-op Assessment: Report given to RN and Post -op Vital signs reviewed and stable  Post vital signs: Reviewed and stable  Last Vitals:  Filed Vitals:   06/06/15 0725  BP: 129/77  Pulse: 59  Resp: 20    Complications: No apparent anesthesia complications

## 2015-06-06 NOTE — Progress Notes (Signed)
Faculty Practice OB/GYN Attending Note  Patient is doing better s/p transfusion and had increased UOP.  Vitals are more stable. Benign abdomen.  Patient is conversing without diffuculty.  Total I/O In: [id number] [I.V.:5250; Blood:670] Out: 1400 [Urine:300; Blood:1100]  Filed Vitals:   06/06/15 1545 06/06/15 1600 06/06/15 1615 06/06/15 1700  BP: 106/64 107/66 109/61 117/65  Pulse: 66 56 60 66  Temp:  98.5 F (36.9 C)    TempSrc:      Resp: 12 16 16 16   SpO2: 100% 100% 100% 100%   However, Dr. Jillyn Hidden (Anesthesiologist) and I feel she needs ICU care/observation overnight.  This was communicated to the [place] Coverage RN.  Patient will remain in PACU then will be transferred to [facility] ICU later.  If she remains stable, she will be transferred to the floor (Women's Unit) in the morning.   Verita Schneiders, MD, [place] Attending [place], [facility]

## 2015-06-06 NOTE — Anesthesia Procedure Notes (Signed)
Procedure Name: Intubation Date/Time: 06/06/2015 8:48 AM Performed by: Flossie Dibble Pre-anesthesia Checklist: Patient being monitored, Suction available, Emergency Drugs available, Patient identified and Timeout performed Patient Re-evaluated:Patient Re-evaluated prior to inductionOxygen Delivery Method: Circle system utilized Preoxygenation: Pre-oxygenation with 100% oxygen Intubation Type: IV induction Ventilation: Mask ventilation without difficulty Laryngoscope Size: Mac and 3 Grade View: Grade I Tube size: 7.0 mm Number of attempts: 1 Airway Equipment and Method: Stylet Placement Confirmation: ETT inserted through vocal cords under direct vision,  positive ETCO2 and breath sounds checked- equal and bilateral Secured at: 21.5 cm Tube secured with: Tape Dental Injury: Teeth and Oropharynx as per pre-operative assessment

## 2015-06-06 NOTE — H&P (Signed)
Preoperative History and Physical  [name] is a 50 y.o. [id number] here for surgical management of:   [place] Kitchen Fibroids  . Abnormal uterine bleeding (AUB)  . Left ovarian cyst  . Simple endometrial hyperplasia without atypia  No significant preoperative concerns.  Proposed surgery: Laparoscopic assisted vaginal hysterectomy, possible bilateral salpingo-oophorectomy (patient wants to retain one normal ovary if possible)  Past Medical History  Diagnosis Date  . Abnormal Pap smear 2002    LGSIL  . Hemorrhoid   . Allergy   . Shortness of breath dyspnea     with exercise since starting [facility]   . Hypothyroidism   . GERD (gastroesophageal reflux disease)     last week or so only    Past Surgical History  Procedure Laterality Date  . Tubal ligation    . Breast surgery  1994    left breast bx  . Leep  2002  . Dilation and curettage of uterus      TAB  . Colposcopy  2002    ABNORMAL PAP  . Colonoscopy w/ biopsies  2013    divert. cleared for 5 yrs- [facility] Doc   OB History  Gravida Para Term Preterm AB SAB TAB Ectopic Multiple Living  3 2 2  1     2     # Outcome Date GA Lbr Len/2nd Weight Sex Delivery Anes PTL Lv  3 Term 7    F Vag-Spont   Y  2 AB 1993    U    N  1 Term 1990    M Vag-Spont   Y    Patient denies any other pertinent gynecologic issues.   No current facility-administered medications on file prior to encounter.   Current Outpatient Prescriptions on File Prior to Encounter  Medication Sig Dispense Refill  . docusate sodium (COLACE) 100 MG capsule Take 100 mg by mouth 2 (two) times daily. otc    . levothyroxine (LEVOTHROID) 25 MCG tablet Take 1 tablet (25 mcg total) by mouth daily before breakfast. 90 tablet 3  . loratadine (CLARITIN) 10 MG tablet Take 10 mg by mouth daily as needed for allergies. otc    . megestrol (MEGACE) 40 MG tablet Take 1 tablet (40 mg total) by mouth 2 (two) times daily. Can increase to two tablets twice a day in the event of heavy  bleeding (Patient taking differently: Take 80 mg by mouth 2 (two) times daily. Can increase to two tablets twice a day in the event of heavy bleeding) 60 tablet 5  . METRONIDAZOLE, TOPICAL, 0.75 % LOTN Apply 1 application topically 2 (two) times daily. (Patient taking differently: Apply 1 application topically daily as needed. ) 59 mL 3  . Multiple Vitamins-Minerals (MULTIVITAMIN WITH MINERALS) tablet Take 1 tablet by mouth daily.    . Probiotic Product (ALIGN) 4 MG CAPS Take 1 capsule by mouth daily. 30 capsule 6   Allergies  Allergen Reactions  . Doxycycline Other (See Comments)    Thrush  . Ciprofloxacin Other (See Comments)    Yeast infection  . Sulfa Antibiotics Other (See Comments)    Yeast infections    Social History:   reports that she has never smoked. She has never used smokeless tobacco. She reports that she does not drink alcohol or use illicit drugs.  Family History  Problem Relation Age of Onset  . Hypertension Father   . Diabetes Father   . Stroke Maternal Grandfather   . Diabetes Paternal Aunt   . Heart  disease Paternal Aunt   . Diabetes Paternal Uncle   . Heart disease Paternal Uncle   . Colon cancer Neg Hx   . Esophageal cancer Neg Hx   . Stomach cancer Neg Hx   . Cancer Maternal Grandmother     female cancer    Review of Systems: Noncontributory  PHYSICAL EXAM: AFVSS GENERAL: Well-developed, well-nourished female in no acute distress.  HENT:  Normocephalic, atraumatic, External right and left ear normal. Oropharynx is clear and moist EYES: Conjunctivae and EOM are normal. Pupils are equal, round, and reactive to light. No scleral icterus.  NECK: Normal range of motion, supple, no masses SKIN: Skin is warm and dry. No rash noted. Not diaphoretic. No erythema. No pallor. [place]: Alert and oriented to person, place, and time. Normal reflexes, muscle tone coordination. No cranial nerve deficit noted. PSYCHIATRIC: Normal mood and affect. Normal behavior.  Normal judgment and thought content. CARDIOVASCULAR: Normal heart rate noted, regular rhythm RESPIRATORY: Effort and breath sounds normal, no problems with respiration noted ABDOMEN: Soft, nontender, nondistended. PELVIC: Deferred MUSCULOSKELETAL: Normal range of motion. No edema and no tenderness. 2+ distal pulses.  Labs: Results for orders placed or performed during the hospital encounter of 05/29/15 (from the past 336 hour(s))  CBC   Collection Time: 05/29/15  2:40 PM  Result Value Ref Range   WBC 10.2 4.0 - 10.5 K/uL   RBC 4.72 3.87 - 5.11 MIL/uL   Hemoglobin 14.1 12.0 - 15.0 g/dL   HCT 41.6 36.0 - 46.0 %   MCV 88.1 78.0 - 100.0 fL   MCH 29.9 26.0 - 34.0 pg   MCHC 33.9 30.0 - 36.0 g/dL   RDW 12.9 11.5 - 15.5 %   Platelets 212 150 - 400 K/uL  Type and screen   Collection Time: 05/29/15  2:40 PM  Result Value Ref Range   ABO/RH(D) A POS    Antibody Screen NEG    Sample Expiration 06/12/2015    Extend sample reason NO TRANSFUSIONS OR PREGNANCY IN THE PAST 3 MONTHS   ABO/Rh   Collection Time: 05/29/15  2:40 PM  Result Value Ref Range   ABO/RH(D) A POS    03/15/2015 Endometrial biopsy SIMPLE ENDOMETRIAL HYPERPLASIA WITHOUT ATYPIA, NO MALIGNANCY.   03/17/2015 TRANSABDOMINAL AND TRANSVAGINAL ULTRASOUND OF PELVIS CLINICAL DATA: Initial encounter for abnormal uterine bleeding. EXAM: TECHNIQUE: Both transabdominal and transvaginal ultrasound examinations of the pelvis were performed. Transabdominal technique was performed for global imaging of the pelvis including uterus, ovaries, adnexal regions, and pelvic cul-de-sac. It was necessary to proceed with endovaginal exam following the transabdominal exam to visualize the endometrium. COMPARISON: None FINDINGS: Uterus Measurements: 11.4 x 5.7 x 6.7 cm. Multiple heterogeneous shadowing lesions are seen within the myometrium, suggesting fibroids. These are not well discriminated, but appear more focal than would be expected for  adenomyosis. One of the larger lesions appears to be an exophytic fundal subserosal fibroid measuring up to 5.3 cm. Anterior 3.5 cm fibroid appears to have a submucosal component. Endometrium Thickness: 16-17 mm. No focal abnormality visualized. Right ovary Measurements: 1.8 x 2.9 x 2.6 cm. Multiple follicles evident. Left ovary Measurements: 4.2 x 2.3 x 4.9 cm. 3.0 x 2.8 x 2.8 cm lesion with internal septation identified. No evidence for papillary excrescence, mural nodularity, or irregular septal thickening. Other findings No free fluid. IMPRESSION: 1. Multiple uterine fibroids including a right anterior lesion that has a submucosal component. 2. 17 mm endometrial stripe thickness. Assuming premenopausal status, if bleeding remains unresponsive to hormonal or  medical therapy, focal lesion work-up with sonohysterogram should be considered. Endometrial biopsy should also be considered in pre-menopausal patients at high risk for endometrial carcinoma. (Ref: Radiological Reasoning: Algorithmic Workup of Abnormal Vaginal Bleeding with Endovaginal Sonography and Sonohysterography. AJR 2008; [id number]) 3. 3.0 cm cystic lesion in the left ovary with a single internal septation. This is considered a cyst with indeterminate but probably benign characteristics. Consensus criteria suggest for a cyst of this size and imaging characteristics, no follow-up is necessary in premenopausal patients and a follow-up ultrasound in 1 year is recommended for postmenopausal patients. This recommendation follows the consensus statement: Management of Asymptomatic Ovarian and Other Adnexal Cysts Imaged at [place]: Society of Radiologists in [place]. Radiology 2010; [phone]. Electronically Signed By: Misty Stanley M.D. On: 03/17/2015 10:49   Assessment: . Fibroids  . Abnormal uterine bleeding (AUB)  . Left ovarian cyst  . Simple endometrial hyperplasia without atypia   Plan: Patient will  undergo surgical management with LAVH/BSO.  Patient wants to retain one normal ovary if possible.   The risks of surgery were discussed in detail with the patient including but not limited to: bleeding which may require transfusion or reoperation; infection which may require antibiotics; injury to surrounding organs which may involve bowel, bladder, ureters ; need for additional procedures including laparotomy; thromboembolic phenomenon, surgical site problems and other postoperative/anesthesia complications. Likelihood of success in alleviating the patient's condition was discussed. Routine postoperative instructions will be reviewed with the patient and her family in detail after surgery.  The patient concurred with the proposed plan, giving informed written consent for the surgery.  Patient has been NPO since last night she will remain NPO for procedure.  Anesthesia and OR aware.  Preoperative prophylactic antibiotics and SCDs ordered on call to the OR.  To OR when ready.  Verita Schneiders, M.D. 06/06/2015 7:23 AM

## 2015-06-06 NOTE — OR Nursing (Signed)
Will give patient IV Lasix for poor urine output and bil sclera edema per  Dr Hatchett's orders. Kristine Royal, RN

## 2015-06-07 ENCOUNTER — Encounter (HOSPITAL_COMMUNITY): Payer: Self-pay | Admitting: Obstetrics & Gynecology

## 2015-06-07 LAB — CBC
HEMATOCRIT: 32.2 % — AB (ref 36.0–46.0)
Hemoglobin: 10.9 g/dL — ABNORMAL LOW (ref 12.0–15.0)
MCH: 28.8 pg (ref 26.0–34.0)
MCHC: 33.9 g/dL (ref 30.0–36.0)
MCV: 85 fL (ref 78.0–100.0)
PLATELETS: 137 10*3/uL — AB (ref 150–400)
RBC: 3.79 MIL/uL — AB (ref 3.87–5.11)
RDW: 15.9 % — ABNORMAL HIGH (ref 11.5–15.5)
WBC: 11.7 10*3/uL — ABNORMAL HIGH (ref 4.0–10.5)

## 2015-06-07 LAB — TYPE AND SCREEN
ABO/RH(D): A POS
ANTIBODY SCREEN: NEGATIVE
UNIT DIVISION: 0
UNIT DIVISION: 0
UNIT DIVISION: 0
UNIT DIVISION: 0
Unit division: 0
Unit division: 0

## 2015-06-07 LAB — COMPREHENSIVE METABOLIC PANEL
ALT: 8 U/L — AB (ref 14–54)
AST: 17 U/L (ref 15–41)
Albumin: 3 g/dL — ABNORMAL LOW (ref 3.5–5.0)
Alkaline Phosphatase: 34 U/L — ABNORMAL LOW (ref 38–126)
Anion gap: 7 (ref 5–15)
BILIRUBIN TOTAL: 0.8 mg/dL (ref 0.3–1.2)
BUN: 9 mg/dL (ref 6–20)
CALCIUM: 8.4 mg/dL — AB (ref 8.9–10.3)
CHLORIDE: 107 mmol/L (ref 101–111)
CO2: 26 mmol/L (ref 22–32)
CREATININE: 0.85 mg/dL (ref 0.44–1.00)
Glucose, Bld: 102 mg/dL — ABNORMAL HIGH (ref 65–99)
Potassium: 3.7 mmol/L (ref 3.5–5.1)
Sodium: 140 mmol/L (ref 135–145)
TOTAL PROTEIN: 5.2 g/dL — AB (ref 6.5–8.1)

## 2015-06-07 NOTE — Anesthesia Postprocedure Evaluation (Signed)
Anesthesia Post Note  Patient: [name]  Procedure(s) Performed: Procedure(s) (LRB): LAPAROSCOPIC ASSISTED VAGINAL HYSTERECTOMY  (N/A) CYSTOSCOPY  Patient location during evaluation: Women's Unit Anesthesia Type: General Level of consciousness: awake and alert and oriented Pain management: pain level controlled Vital Signs Assessment: post-procedure vital signs reviewed and stable Respiratory status: respiratory function stable Cardiovascular status: blood pressure returned to baseline Postop Assessment: no signs of nausea or vomiting and adequate PO intake Anesthetic complications: no    Last Vitals:  Filed Vitals:   06/07/15 0400 06/07/15 0730  BP: 95/55 102/55  Pulse: 71 55  Temp: 37.1 C 37.2 C  Resp: 16 18    Last Pain:  Filed Vitals:   06/07/15 0914  PainSc: 2                  Kayla Deshaies, Darci Needle

## 2015-06-07 NOTE — Addendum Note (Signed)
Addendum  created 06/07/15 1031 by Bufford Spikes, CRNA   Modules edited: Clinical Notes   Clinical Notes:  File: [id number]

## 2015-06-07 NOTE — Progress Notes (Signed)
1 Day Post-Op Procedure(s) (LRB): LAPAROSCOPIC ASSISTED VAGINAL HYSTERECTOMY  (N/A) CYSTOSCOPY  Subjective: Patient reports incisional pain, tolerating PO and no problems voiding.  No flatus yet.  Has gas pain. Was transferred from AICU to floor this morning.  Objective: I have reviewed patient's vital signs, intake and output, medications and labs.  General: alert and no distress Resp: clear to auscultation bilaterally Cardio: regular rate and rhythm GI: soft, non-tender; bowel sounds normal; no masses,  no organomegaly and incision: clean, dry and intact Extremities: extremities normal, atraumatic, no cyanosis or edema and Homans sign is negative, no sign of DVT Vaginal Bleeding: minimal  CBC Latest Ref Rng 06/07/2015 06/06/2015 06/06/2015  WBC 4.0 - 10.5 K/uL 11.7(H) - 13.8(H)  Hemoglobin 12.0 - 15.0 g/dL 10.9(L) - 8.5(L)  Hematocrit 36.0 - 46.0 % 32.2(L) - 25.2(L)  Platelets 150 - 400 K/uL 137(L) 127(L) 124(L)  Preop Hgb 14 Received 3 units pRBCs for intraoperative EBL ~1100 ml   Assessment: s/p Procedure(s) with comments: LAPAROSCOPIC ASSISTED VAGINAL HYSTERECTOMY  (N/A) - 722.9grams CYSTOSCOPY: stable and progressing well Surgery complicated by large EBL  Plan: Advance diet Encourage ambulation Advance to PO medication Discontinue IV fluids  Will discharge to home tomorrow; observe today on floor after discharge from ICU earlier this morning.   LOS: 1 day    [name] A, MD 06/07/2015, 8:17 AM

## 2015-06-08 MED ORDER — DOCUSATE SODIUM 100 MG PO CAPS
100.0000 mg | ORAL_CAPSULE | Freq: Two times a day (BID) | ORAL | Status: DC
Start: 2015-06-08 — End: 2015-07-05

## 2015-06-08 MED ORDER — IBUPROFEN 600 MG PO TABS: 600.0000 mg | ORAL_TABLET | Freq: Four times a day (QID) | ORAL | Status: DC | PRN

## 2015-06-08 MED ORDER — OXYCODONE-ACETAMINOPHEN 5-325 MG PO TABS: 1.0000 | ORAL_TABLET | ORAL | Status: DC | PRN

## 2015-06-08 NOTE — Progress Notes (Signed)
Patient discharged home with significant other... Discharge instructions reviewed with patient and she verbalized understanding... Condition stable... No equipment... Ambulated to car with E. Clearnce Leja, RN.  

## 2015-06-08 NOTE — Discharge Instructions (Signed)
Laparoscopically Assisted Vaginal Hysterectomy, Care After Refer to this sheet in the next few weeks. These instructions provide you with information on caring for yourself after your procedure. Your health care provider may also give you more specific instructions. Your treatment has been planned according to current medical practices, but problems sometimes occur. Call your health care provider if you have any problems or questions after your procedure. WHAT TO EXPECT AFTER THE PROCEDURE After your procedure, it is typical to have the following:  Abdominal pain. You will be given pain medicine to control it.  Sore throat from the breathing tube that was inserted during surgery. HOME CARE INSTRUCTIONS  Only take over-the-counter or prescription medicines for pain, discomfort, or fever as directed by your health care provider.  Do not take aspirin. It can cause bleeding.  Do not drive when taking pain medicine.  Follow your health care provider's advice regarding diet, exercise, lifting, driving, and general activities.  Resume your usual diet as directed and allowed.  Get plenty of rest and sleep.  Do not douche, use tampons, or have sexual intercourse for at least 8 weeks.  Change your bandages (dressings) as directed by your health care provider.  Monitor your temperature and notify your health care provider of a fever.  Take showers instead of baths for 2-3 weeks.  Do not drink alcohol until your health care provider gives you permission.  If you develop constipation, you may take a mild laxative with your health care provider's permission. Bran foods may help with constipation problems. Drinking enough fluids to keep your urine clear or pale yellow may help as well.  Try to have someone home with you for 1-2 weeks to help around the house.  Keep all of your follow-up appointments as directed by your health care provider. SEEK MEDICAL CARE IF:   You have swelling, redness,  or increasing pain around your incision sites.  You have pus coming from your incision.  You notice a bad smell coming from your incision.  Your incision breaks open.  You feel dizzy or lightheaded.  You have pain or bleeding when you urinate.  You have persistent diarrhea.  You have persistent nausea and vomiting.  You have abnormal vaginal discharge.  You have a rash.  You have any type of abnormal reaction or develop an allergy to your medicine.  You have poor pain control with your prescribed medicine. SEEK IMMEDIATE MEDICAL CARE IF:   You have a fever.  You have severe abdominal pain.  You have chest pain.  You have shortness of breath.  You faint.  You have pain, swelling, or redness in your leg.  You have heavy vaginal bleeding with blood clots. MAKE SURE YOU:  Understand these instructions.  Will watch your condition.  Will get help right away if you are not doing well or get worse.   This information is not intended to replace advice given to you by your health care provider. Make sure you discuss any questions you have with your health care provider.   Document Released: 04/18/2011 Document Revised: 05/04/2013 Document Reviewed: 11/12/2012 Elsevier Interactive Patient Education [employer].

## 2015-06-08 NOTE — Discharge Summary (Signed)
Gynecology Physician Postoperative Discharge Summary  Patient ID: [name] MRN: [id number] DOB/AGE: 12/07/1965 50 y.o.  Admit Date: 06/06/2015 Discharge Date: 06/08/2015  Preoperative Diagnoses:  . Fibroids  . Abnormal uterine bleeding (AUB)  . Simple endometrial hyperplasia without atypia   Postoperative Diagnoses: . Postoperative anemia due to acute blood loss  . S/P laparoscopic assisted vaginal hysterectomy (LAVH)  . Fibroids  . Abnormal uterine bleeding (AUB)  . Simple endometrial hyperplasia without atypia   Procedures: Procedure(s) (LRB): LAPAROSCOPIC ASSISTED VAGINAL HYSTERECTOMY  (N/A) CYSTOSCOPY TRANSFUSION OF 3 UNITS PRBCS   Hospital Course:  [name] is a 50 y.o. [id number]  admitted for scheduled surgery.  She underwent the procedures as mentioned above, her operation was complicated by larger than expected blood loss necessitating transfusion of 3 units of pRBCs in the PACU.  Her preoperative hemoglobin was 14.1 and went to 8.5 as per PACU lab draw.  For further details about surgery and subsequent transfusion in the PACU, please refer to the operative report and immediate postoperative notes.  Her hemoglobin after transfusion was 10.9.  Her urine output which was initially low during surgery and immediately after surgery (attributed to blood loss as bladder and ureteral injury were ruled out on cystoscopy), picked up significantly after transfusion.  She was observed in the AICU overnight and had no concerning events. She was transferred to the floor on the morning on POD#1; and then had an uncomplicated rest of her postoperative course. By time of discharge on POD#2, her pain was controlled on oral pain medications; she was ambulating, voiding without difficulty, tolerating regular diet and passing flatus. She was deemed stable for discharge to home.   Significant Labs: CBC Latest Ref Rng 06/07/2015 06/06/2015 05/29/2015  WBC 4.0 - 10.5 K/uL 11.7(H) 13.8(H) 10.2   Hemoglobin 12.0 - 15.0 g/dL 10.9(L) 8.5(L) 14.1  Hematocrit 36.0 - 46.0 % 32.2(L) 25.2(L) 41.6  Platelets 150 - 400 K/uL 137(L) 247 212   CMP Latest Ref Rng 06/07/2015 06/06/2015 01/25/2015  Glucose 65 - 99 mg/dL 102(H) 130(H) 68  BUN 6 - 20 mg/dL 9 10 9   Creatinine 0.44 - 1.00 mg/dL 0.85 0.94 0.75  Sodium 135 - 145 mmol/L 140 139 145(H)  Potassium 3.5 - 5.1 mmol/L 3.7 4.0 3.9  Chloride 101 - 111 mmol/L 107 109 99  CO2 22 - 32 mmol/L 26 24 24   Calcium 8.9 - 10.3 mg/dL 8.4(L) 7.9(L) 9.3  Total Protein 6.5 - 8.1 g/dL 5.2(L) 4.4(L) 6.5  Total Bilirubin 0.3 - 1.2 mg/dL 0.8 0.4 0.3  Alkaline Phos 38 - 126 U/L 34(L) 29(L) 63  AST 15 - 41 U/L 17 15 20   ALT 14 - 54 U/L 8(L) 7(L) 9   Surgical Pathology Uterus and bilateral fallopian tubes, cervix - UTERUS: - ENDOMETRIUM: ENDOMETRIAL TYPE POLYP AND INACTIVE ENDOMETRIUM WITH PROGESTIN EFFECT.  NO HYPERPLASIA, ATYPIA OR MALIGNANCY. - MYOMETRIUM: ADENOMYOSIS. LEIOMYOMATA. NO MALIGNANCY. - SEROSA: ENDOSALPINGIOSIS. NO MALIGNANCY. - CERVIX: ENDOCERVICAL TYPE POLYP. NO DYSPLASIA OR MALIGNANCY. - BILATERAL FALLOPIAN TUBES: PARATUBAL CYSTS. NO MALIGNANCY.  Discharge Exam: Blood pressure 119/66, pulse 62, temperature 99 F (37.2 C), temperature source Oral, resp. rate 18, height 5' 7.5" (1.715 m), weight 165 lb 6 oz (75.014 kg), SpO2 98 %. General appearance: alert and no distress  Resp: clear to auscultation bilaterally  Cardio: regular rate and rhythm  GI: soft, non-tender; bowel sounds normal; no masses, no organomegaly.  Incision: C/D/I, no erythema, no drainage noted Pelvic: scant blood on pad  Extremities: extremities  normal, atraumatic, no cyanosis or edema and Homans sign is negative, no sign of DVT  Discharged Condition: Stable  Disposition: Home    Medication List    STOP taking these medications        megestrol 40 MG tablet  Commonly known as:  MEGACE      TAKE these medications        ALIGN 4 MG Caps  Take 1 capsule by  mouth daily.     docusate sodium 100 MG capsule  Commonly known as:  COLACE  Take 1 capsule (100 mg total) by mouth 2 (two) times daily.     docusate sodium 100 MG capsule  Commonly known as:  COLACE  Take 100 mg by mouth 2 (two) times daily. otc     ibuprofen 600 MG tablet  Commonly known as:  ADVIL,MOTRIN  Take 1 tablet (600 mg total) by mouth every 6 (six) hours as needed (mild pain).     levothyroxine 25 MCG tablet  Commonly known as:  LEVOTHROID  Take 1 tablet (25 mcg total) by mouth daily before breakfast.     loratadine 10 MG tablet  Commonly known as:  CLARITIN  Take 10 mg by mouth daily as needed for allergies. otc     METRONIDAZOLE (TOPICAL) 0.75 % Lotn  Apply 1 application topically 2 (two) times daily.     multivitamin with minerals tablet  Take 1 tablet by mouth daily.     oxyCODONE-acetaminophen 5-325 MG tablet  Commonly known as:  PERCOCET/ROXICET  Take 1-2 tablets by mouth every 4 (four) hours as needed for severe pain (moderate to severe pain (when tolerating fluids)).     TYLENOL ARTHRITIS PAIN PO  Take 2 tablets by mouth daily as needed (pain).       Follow-up Information    Follow up with Osborne Oman, MD On 07/05/2015.   Specialty:  Obstetrics and Gynecology   Why:  9:30 AM for postpartum appointment. Call clinic/come to MAU for any concerning issues.   Contact information:   [place] [address] [phone]       Signed:  Verita Schneiders, MD, [place] Attending [place], [facility]

## 2015-06-12 ENCOUNTER — Encounter: Payer: No Typology Code available for payment source | Admitting: Family Medicine

## 2015-07-05 ENCOUNTER — Encounter: Payer: Self-pay | Admitting: Obstetrics & Gynecology

## 2015-07-05 ENCOUNTER — Ambulatory Visit (INDEPENDENT_AMBULATORY_CARE_PROVIDER_SITE_OTHER): Payer: No Typology Code available for payment source | Admitting: Obstetrics & Gynecology

## 2015-07-05 VITALS — BP 100/70 | HR 70 | Resp 18 | Ht 67.0 in | Wt 164.0 lb

## 2015-07-05 DIAGNOSIS — N760 Acute vaginitis: Secondary | ICD-10-CM

## 2015-07-05 DIAGNOSIS — Z09 Encounter for follow-up examination after completed treatment for conditions other than malignant neoplasm: Secondary | ICD-10-CM

## 2015-07-05 DIAGNOSIS — R300 Dysuria: Secondary | ICD-10-CM | POA: Diagnosis not present

## 2015-07-05 DIAGNOSIS — Z1231 Encounter for screening mammogram for malignant neoplasm of breast: Secondary | ICD-10-CM

## 2015-07-05 DIAGNOSIS — A499 Bacterial infection, unspecified: Secondary | ICD-10-CM

## 2015-07-05 DIAGNOSIS — B9689 Other specified bacterial agents as the cause of diseases classified elsewhere: Secondary | ICD-10-CM

## 2015-07-05 DIAGNOSIS — G4700 Insomnia, unspecified: Secondary | ICD-10-CM

## 2015-07-05 LAB — POCT URINALYSIS DIPSTICK
BILIRUBIN UA: NEGATIVE
Glucose, UA: NEGATIVE
KETONES UA: NEGATIVE
LEUKOCYTES UA: NEGATIVE
Nitrite, UA: NEGATIVE
Protein, UA: NEGATIVE
Spec Grav, UA: 1.01
Urobilinogen, UA: NEGATIVE
pH, UA: 5

## 2015-07-05 MED ORDER — METRONIDAZOLE 500 MG PO TABS: 500.0000 mg | ORAL_TABLET | Freq: Two times a day (BID) | ORAL | Status: DC

## 2015-07-05 MED ORDER — ZOLPIDEM TARTRATE 10 MG PO TABS: 10.0000 mg | ORAL_TABLET | Freq: Every evening | ORAL | Status: DC | PRN

## 2015-07-05 NOTE — Patient Instructions (Signed)
  Thank you for enrolling in [facility]. Please follow the instructions below to securely access your online medical record. MyChart allows you to send messages to your doctor, view your test results, manage appointments, and more.   How Do I Sign Up? 1. In your Internet browser, go to [employer] and enter [email or web address]. 2. Click on the Sign Up Now link in the Sign In box. You will see the New Member Sign Up page. 3. Enter your MyChart Access Code exactly as it appears below. You will not need to use this code after you've completed the sign-up process. If you do not sign up before the expiration date, you must request a new code.  MyChart Access Code: [email or web address] Expires: 07/28/2015  2:15 PM  4. Enter your Social Security Number ([id number]) and Date of Birth (mm/dd/yyyy) as indicated and click Submit. You will be taken to the next sign-up page. 5. Create a MyChart ID. This will be your MyChart login ID and cannot be changed, so think of one that is secure and easy to remember. 6. Create a MyChart password. You can change your password at any time. 7. Enter your Password Reset Question and Answer. This can be used at a later time if you forget your password.  8. Enter your e-mail address. You will receive e-mail notification when new information is available in [place]. 9. Click Sign Up. You can now view your medical record.   Additional Information Remember, MyChart is NOT to be used for urgent needs. For medical emergencies, dial 911.

## 2015-07-05 NOTE — Progress Notes (Signed)
Pt here for post-op check, c/o vaginal discharge with slight odor.  Also having trouble with sleeping, has old rx of Ambien, requesting refill.

## 2015-07-05 NOTE — Progress Notes (Signed)
    Subjective:     [name] is a 50 y.o. [phone] female who presents to the clinic 4 weeks status post laparoscopic assisted vaginal hysterectomy for abnormal uterine bleeding and simple endometrial hyperplasia. Eating a regular diet without difficulty. Bowel movements are normal. The patient is not having any pain. She does report insomnia, desires Ambien refill. She takes 10 mg as needed.  She also reports abnormal vaginal discharge and some dysuria.  The following portions of the patient's history were reviewed and updated as appropriate: allergies, current medications, past family history, past medical history, past social history, past surgical history and problem list.  Review of Systems Pertinent items noted in HPI and remainder of comprehensive ROS otherwise negative.    Objective:    BP 100/70 mmHg  Pulse 70  Resp 18  Ht 5\' 7"  (1.702 m)  Wt 164 lb (74.39 kg)  BMI 25.68 kg/m2  LMP 12/12/2014 General:  alert and no distress  Abdomen: soft, bowel sounds active, non-tender  Incision:   healing well, no drainage, no erythema, no hernia, no seroma, no swelling, no dehiscence, incision well approximated  Pelvic: Healing vaginal cuff. Off-white, thin, malodorous discharge seen, wet prep obtained.   Surgical Pathology Uterus and bilateral fallopian tubes, cervix - UTERUS: - ENDOMETRIUM: ENDOMETRIAL TYPE POLYP AND INACTIVE ENDOMETRIUM WITH PROGESTIN EFFECT. NO HYPERPLASIA, ATYPIA OR MALIGNANCY. - MYOMETRIUM: ADENOMYOSIS. LEIOMYOMATA. NO MALIGNANCY. - SEROSA: ENDOSALPINGIOSIS. NO MALIGNANCY. - CERVIX: ENDOCERVICAL TYPE POLYP. NO DYSPLASIA OR MALIGNANCY. - BILATERAL FALLOPIAN TUBES: PARATUBAL CYSTS. NO MALIGNANCY.   Labs: UA: Negative for LE, nitrates.  Assessment:    Doing well postoperatively. Operative findings again reviewed. Pathology report again discussed.    Plan:  1. Postoperative examination - Continue any current medications. - Wound care discussed. -  Activity restrictions: pelvic rest for 4 additional weeks -  Anticipated return to work: now.  2. Visit for screening mammogram Normal one last April - MM DIGITAL SCREENING BILATERAL; Future  3. BV (bacterial vaginosis) Presumed BV, will follow up results of wet prep and manage accordingly. - metroNIDAZOLE (FLAGYL) 500 MG tablet; Take 1 tablet (500 mg total) by mouth 2 (two) times daily.  Dispense: 14 tablet; Refill: 0  4. Dysuria - POCT Urinalysis Dipstick negative for UTI  5. Insomnia - zolpidem (AMBIEN) 10 MG tablet; Take 1 tablet (10 mg total) by mouth at bedtime as needed for sleep.  Dispense: 30 tablet; Refill: [place], MD, [place] Attending Obstetrician & Gynecologist, [place] for [facility]

## 2015-07-06 LAB — WET PREP, GENITAL
TRICH WET PREP: NONE SEEN
YEAST WET PREP: NONE SEEN

## 2015-08-24 ENCOUNTER — Ambulatory Visit (INDEPENDENT_AMBULATORY_CARE_PROVIDER_SITE_OTHER): Payer: No Typology Code available for payment source | Admitting: Family Medicine

## 2015-08-24 ENCOUNTER — Ambulatory Visit: Payer: No Typology Code available for payment source | Admitting: Family Medicine

## 2015-08-24 ENCOUNTER — Encounter: Payer: Self-pay | Admitting: Family Medicine

## 2015-08-24 VITALS — BP 110/62 | HR 66 | Ht 67.0 in | Wt 161.0 lb

## 2015-08-24 DIAGNOSIS — K589 Irritable bowel syndrome without diarrhea: Secondary | ICD-10-CM | POA: Diagnosis not present

## 2015-08-24 DIAGNOSIS — L719 Rosacea, unspecified: Secondary | ICD-10-CM

## 2015-08-24 DIAGNOSIS — Z833 Family history of diabetes mellitus: Secondary | ICD-10-CM | POA: Insufficient documentation

## 2015-08-24 DIAGNOSIS — D649 Anemia, unspecified: Secondary | ICD-10-CM | POA: Diagnosis not present

## 2015-08-24 DIAGNOSIS — E039 Hypothyroidism, unspecified: Secondary | ICD-10-CM | POA: Diagnosis not present

## 2015-08-24 DIAGNOSIS — G4700 Insomnia, unspecified: Secondary | ICD-10-CM

## 2015-08-24 DIAGNOSIS — N393 Stress incontinence (female) (male): Secondary | ICD-10-CM

## 2015-08-24 NOTE — Progress Notes (Signed)
Date:  08/24/2015   Name:  [name]   DOB:  26-Apr-1966   MRN:  [id number]  PCP:  Adline Potter, MD    Chief Complaint: Hypothyroidism and Urinary Incontinence   History of Present Illness:  This is a 50 y.o. female seen for 6 month f/u. Had hysterectomy in January for excessive bleeding and ovarian cyst, still anemic with last blood test. On Synthroid x 7 months, last TSH one month after starting ok. Rosacea has flared since surgery, using metro cream 2-3 times weekly. IBS sxs stable on increased fiber, AR sxs stable on daily Claritin. Has developed worsened stress incontinence since surgery, not improving. Takes nightly Ambien per GYN.  Review of Systems:  Review of Systems  Constitutional: Negative for fever and fatigue.  Respiratory: Negative for cough and shortness of breath.   Cardiovascular: Negative for chest pain and leg swelling.  Genitourinary: Negative for dysuria, urgency and frequency.  Neurological: Negative for syncope and light-headedness.    Patient Active Problem List   Diagnosis Date Noted  . Insomnia 08/24/2015  . Stress incontinence in female 08/24/2015  . FH: diabetes mellitus 08/24/2015  . Postoperative anemia due to acute blood loss 06/06/2015  . S/P laparoscopic assisted vaginal hysterectomy (LAVH) 06/06/2015  . Fibroids 03/17/2015  . Abnormal uterine bleeding (AUB) 03/17/2015  . Simple endometrial hyperplasia without atypia 03/15/2015  . Rosacea 02/24/2015  . Hypothyroidism 01/26/2015  . Systolic murmur [id number]  . IBS (irritable bowel syndrome) 06/13/2011  . Hemorrhoid 06/04/2011    Prior to Admission medications   Medication Sig Start Date End Date Taking? Authorizing Provider  Acetaminophen (TYLENOL ARTHRITIS PAIN PO) Take 2 tablets by mouth daily as needed (pain).   Yes Historical Provider, MD  docusate sodium (COLACE) 250 MG capsule Take 250 mg by mouth daily.   Yes Historical Provider, MD  levothyroxine (LEVOTHROID) 25 MCG tablet Take  1 tablet (25 mcg total) by mouth daily before breakfast. 02/27/15  Yes Adline Potter, MD  loratadine (CLARITIN) 10 MG tablet Take 10 mg by mouth daily as needed for allergies. otc   Yes Historical Provider, MD  METRONIDAZOLE, TOPICAL, 0.75 % LOTN Apply 1 application topically 2 (two) times daily. Patient taking differently: Apply 1 application topically daily as needed.  02/24/15  Yes Adline Potter, MD  Multiple Vitamins-Minerals (MULTIVITAMIN WITH MINERALS) tablet Take 1 tablet by mouth daily.   Yes Historical Provider, MD  Probiotic Product (ALIGN) 4 MG CAPS Take 1 capsule by mouth daily. 06/13/11  Yes Jerene Bears, MD  zolpidem (AMBIEN) 10 MG tablet Take 1 tablet (10 mg total) by mouth at bedtime as needed for sleep. Patient taking differently: Take 10 mg by mouth at bedtime as needed for sleep. OBGYN Dr 07/05/15 08/24/15 Yes Osborne Oman, MD    Allergies  Allergen Reactions  . Doxycycline Other (See Comments)    Thrush  . Ciprofloxacin Other (See Comments)    Yeast infection  . Sulfa Antibiotics Other (See Comments)    Yeast infections    Past Surgical History  Procedure Laterality Date  . Tubal ligation    . Breast surgery  1994    left breast bx  . Leep  2002  . Dilation and curettage of uterus      TAB  . Colposcopy  2002    ABNORMAL PAP  . Colonoscopy w/ biopsies  2013    divert. cleared for 5 yrs- G[facility] Doc  . Laparoscopic vaginal hysterectomy with salpingo oophorectomy N/A 06/06/2015  Procedure: LAPAROSCOPIC ASSISTED VAGINAL HYSTERECTOMY ;  Surgeon: Osborne Oman, MD;  Location: [facility];  Service: Gynecology;  Laterality: N/A;  722.9grams   . Cystoscopy  06/06/2015    Procedure: CYSTOSCOPY;  Surgeon: Osborne Oman, MD;  Location: [facility];  Service: Gynecology;;  . Vaginal hysterectomy      Social History  Substance Use Topics  . Smoking status: Never Smoker   . Smokeless tobacco: Never Used  . Alcohol Use: No    Family History  Problem Relation Age of  Onset  . Hypertension Father   . Diabetes Father   . Stroke Maternal Grandfather   . Diabetes Paternal Aunt   . Heart disease Paternal Aunt   . Diabetes Paternal Uncle   . Heart disease Paternal Uncle   . Colon cancer Neg Hx   . Esophageal cancer Neg Hx   . Stomach cancer Neg Hx   . Cancer Maternal Grandmother     female cancer    Medication list has been reviewed and updated.  Physical Examination: BP 110/62 mmHg  Pulse 66  Ht 5\' 7"  (1.702 m)  Wt 161 lb (73.029 kg)  BMI 25.21 kg/m2  LMP 12/12/2014  Physical Exam  Constitutional: She appears well-developed and well-nourished.  Cardiovascular: Normal rate and regular rhythm.   Pulmonary/Chest: Effort normal and breath sounds normal.  Musculoskeletal: She exhibits no edema.  Neurological: She is alert.  Skin: Skin is warm and dry.  Psychiatric: She has a normal mood and affect. Her behavior is normal.  Nursing note and vitals reviewed.   Assessment and Plan:  1. Hypothyroidism, unspecified hypothyroidism type On Synthroid x 7 months - TSH  2. Stress incontinence in female Worsened after hysterectomy, not improving - Ambulatory referral to Urology  3. Postoperative anemia - CBC  4. IBS (irritable bowel syndrome) Improved on increased fiber  5. Rosacea Trial daily metro gel x 1 week then resume usual use  6. Insomnia On nightly Ambien per GYN  7. FH: diabetes mellitus - HgB A1c  Return in about 6 months (around 02/23/2016).  Satira Anis. [facility]  08/24/2015

## 2015-08-25 ENCOUNTER — Ambulatory Visit: Payer: No Typology Code available for payment source | Admitting: Family Medicine

## 2015-08-25 LAB — CBC
HEMOGLOBIN: 13.9 g/dL (ref 11.1–15.9)
Hematocrit: 41 % (ref 34.0–46.6)
MCH: 30.5 pg (ref 26.6–33.0)
MCHC: 33.9 g/dL (ref 31.5–35.7)
MCV: 90 fL (ref 79–97)
Platelets: 206 10*3/uL (ref 150–379)
RBC: 4.56 x10E6/uL (ref 3.77–5.28)
RDW: 14.1 % (ref 12.3–15.4)
WBC: 5.6 10*3/uL (ref 3.4–10.8)

## 2015-08-25 LAB — HEMOGLOBIN A1C
ESTIMATED AVERAGE GLUCOSE: 108 mg/dL
HEMOGLOBIN A1C: 5.4 % (ref 4.8–5.6)

## 2015-08-25 LAB — TSH: TSH: 4.06 u[IU]/mL (ref 0.450–4.500)

## 2015-08-28 ENCOUNTER — Telehealth: Payer: Self-pay

## 2015-08-28 NOTE — Telephone Encounter (Signed)
Spoke with patient. Patient advised of all results and verbalized understanding. Will call back with any future questions or concerns. [facility]  

## 2015-08-28 NOTE — Telephone Encounter (Signed)
-----   Message from Adline Potter, MD sent at 08/28/2015  8:51 AM EDT ----- Inform blood work all ok.

## 2015-09-04 ENCOUNTER — Encounter: Payer: Self-pay | Admitting: *Deleted

## 2015-09-05 ENCOUNTER — Telehealth: Payer: Self-pay | Admitting: *Deleted

## 2015-09-05 NOTE — Telephone Encounter (Signed)
Called pt to follow-up with Mammogram result and verify that she made a follow-up appt with the breast center due to abnormal mammogram.  No answer, left message for pt to call the office.

## 2015-09-18 ENCOUNTER — Telehealth: Payer: Self-pay | Admitting: *Deleted

## 2015-09-18 NOTE — Telephone Encounter (Signed)
Scanned MM Diagnostic Left

## 2015-10-04 ENCOUNTER — Ambulatory Visit (INDEPENDENT_AMBULATORY_CARE_PROVIDER_SITE_OTHER): Payer: No Typology Code available for payment source | Admitting: Urology

## 2015-10-04 ENCOUNTER — Encounter: Payer: Self-pay | Admitting: Urology

## 2015-10-04 VITALS — BP 135/84 | HR 64 | Ht 67.0 in | Wt 163.0 lb

## 2015-10-04 DIAGNOSIS — R3915 Urgency of urination: Secondary | ICD-10-CM | POA: Diagnosis not present

## 2015-10-04 DIAGNOSIS — N393 Stress incontinence (female) (male): Secondary | ICD-10-CM | POA: Diagnosis not present

## 2015-10-04 DIAGNOSIS — R3129 Other microscopic hematuria: Secondary | ICD-10-CM

## 2015-10-04 LAB — MICROSCOPIC EXAMINATION
Bacteria, UA: NONE SEEN
Epithelial Cells (non renal): >10 /hpf — AB (ref 0–10)

## 2015-10-04 LAB — URINALYSIS, COMPLETE
Bilirubin, UA: NEGATIVE
GLUCOSE, UA: NEGATIVE
Ketones, UA: NEGATIVE
NITRITE UA: NEGATIVE
Protein, UA: NEGATIVE
Specific Gravity, UA: 1.025 (ref 1.005–1.030)
UUROB: 0.2 mg/dL (ref 0.2–1.0)
pH, UA: 5 (ref 5.0–7.5)

## 2015-10-04 LAB — BLADDER SCAN AMB NON-IMAGING

## 2015-10-04 MED ORDER — PHENAZOPYRIDINE HCL 100 MG PO TABS: 100.0000 mg | ORAL_TABLET | Freq: Three times a day (TID) | ORAL | Status: DC | PRN

## 2015-10-04 NOTE — Progress Notes (Signed)
10/04/2015 9:23 AM   [name] 1966-02-09 [id number]  Referring provider: Adline Potter, MD [address] [place], [place] [address]  Chief Complaint  Patient presents with  . Urinary Incontinence    New Patient    HPI: 50 year old female who presents today to discuss worsening urinary incontinence following her recent hysterectomy.  She is status post hysterectomy in 05/2015 for dysfunctional uterine bleeding/ uterine fibroid.  The procedure was performed by Dr. Harolyn Rutherford in [place] via laparoscopic assisted vaginal approach. Postoperative course was uncomplicated.  Prior to the surgery, she did have baseline urinary urgency which has been lifelong. As a child, she had issues with enuresis which is since resolved. She reports that when she needs to go the bathroom, she has to go to the toilet immediately otherwise she'll have an accident. She no longer has any urge incontinence. In addition to this, she did have some very mild stress urinary incontinence with light leakage with regards to exercise for which she wore a single pad.  Following her hysterectomy, she's had significant worsening of her urinary incontinence. She now leaks constantly including while in a seated position without activity. She was wearing 3 saturated pads a day but is now down to 2. She reports that she does Kegel's when she experiences urinary urgency to help her get to the bathroom at times but does not do these exercises otherwise on a regular basis.  She is very physically active and this is impacting her quality of life.  She's had 2 normal spontaneous vaginal deliveries. No pain with intercourse although has not been active much since her hysterectomy. No vaginal bulging.  No history of recent urinary tract infections. No issues with incomplete bladder emptying. No gross hematuria or dysuria.  She does have issues with baseline constipation and does take daily Colace in addition to  probiotics for this issue. This has been lifelong.  She does admit to drinking plenty of water throughout the day but avoids beverages before bed. She has no issues with nocturia. She avoids caffeinated beverages other than occasional ice tea with meals.   PMH: Past Medical History  Diagnosis Date  . Abnormal Pap smear 2002    LGSIL  . Hemorrhoid   . Allergy   . Shortness of breath dyspnea     with exercise since starting megace   . Hypothyroidism   . GERD (gastroesophageal reflux disease)     last week or so only     Surgical History: Past Surgical History  Procedure Laterality Date  . Tubal ligation    . Breast surgery  1994    left breast bx  . Leep  2002  . Dilation and curettage of uterus      TAB  . Colposcopy  2002    ABNORMAL PAP  . Colonoscopy w/ biopsies  2013    divert. cleared for 5 yrs- [facility]  . Laparoscopic vaginal hysterectomy with salpingo oophorectomy N/A 06/06/2015    Procedure: LAPAROSCOPIC ASSISTED VAGINAL HYSTERECTOMY ;  Surgeon: Osborne Oman, MD;  Location: [facility];  Service: Gynecology;  Laterality: N/A;  722.9grams   . Cystoscopy  06/06/2015    Procedure: CYSTOSCOPY;  Surgeon: Osborne Oman, MD;  Location: [facility];  Service: Gynecology;;  . Vaginal hysterectomy      Home Medications:    Medication List       This list is accurate as of: 10/04/15  9:23 AM.  Always use your most recent med  list.               ALIGN 4 MG Caps  Take 1 capsule by mouth daily.     docusate sodium 250 MG capsule  Commonly known as:  COLACE  Take 250 mg by mouth daily.     levothyroxine 25 MCG tablet  Commonly known as:  LEVOTHROID  Take 1 tablet (25 mcg total) by mouth daily before breakfast.     loratadine 10 MG tablet  Commonly known as:  CLARITIN  Take 10 mg by mouth daily as needed for allergies. otc     METRONIDAZOLE (TOPICAL) 0.75 % Lotn  Apply 1 application topically 2 (two) times daily.     multivitamin with minerals tablet  Take 1  tablet by mouth daily.     TYLENOL ARTHRITIS PAIN PO  Take 2 tablets by mouth daily as needed (pain).     zolpidem 10 MG tablet  Commonly known as:  AMBIEN        Allergies:  Allergies  Allergen Reactions  . Doxycycline Other (See Comments)    Thrush  . Ciprofloxacin Other (See Comments)    Yeast infection  . Sulfa Antibiotics Other (See Comments)    Yeast infections    Family History: Family History  Problem Relation Age of Onset  . Hypertension Father   . Diabetes Father   . Stroke Maternal Grandfather   . Diabetes Paternal Aunt   . Heart disease Paternal Aunt   . Diabetes Paternal Uncle   . Heart disease Paternal Uncle   . Colon cancer Neg Hx   . Esophageal cancer Neg Hx   . Stomach cancer Neg Hx   . Cancer Maternal Grandmother     female cancer  . Prostate cancer Neg Hx   . Kidney cancer Neg Hx   . Bladder Cancer Neg Hx     Social History:  reports that she has never smoked. She has never used smokeless tobacco. She reports that she does not drink alcohol or use illicit drugs.  ROS: UROLOGY Frequent Urination?: No Hard to postpone urination?: Yes Burning/pain with urination?: No Get up at night to urinate?: Yes Leakage of urine?: Yes Urine stream starts and stops?: No Trouble starting stream?: No Do you have to strain to urinate?: No Blood in urine?: No Urinary tract infection?: No Sexually transmitted disease?: Yes Injury to kidneys or bladder?: No Painful intercourse?: No Weak stream?: No Currently pregnant?: No Vaginal bleeding?: No Last menstrual period?: hysterectomy 05/2015  Gastrointestinal Nausea?: No Vomiting?: No Indigestion/heartburn?: No Diarrhea?: No Constipation?: No  Constitutional Fever: No Night sweats?: No Weight loss?: No Fatigue?: Yes  Skin Skin rash/lesions?: No Itching?: No  Eyes Blurred vision?: Yes Double vision?: No  Ears/Nose/Throat Sore throat?: No Sinus problems?: No  Hematologic/Lymphatic Swollen  glands?: No Easy bruising?: No  Cardiovascular Leg swelling?: No Chest pain?: No  Respiratory Cough?: No Shortness of breath?: No  Endocrine Excessive thirst?: Yes  Musculoskeletal Back pain?: Yes Joint pain?: Yes  Neurological Headaches?: Yes Dizziness?: No  Psychologic Depression?: No Anxiety?: No  Physical Exam: BP 135/84 mmHg  Pulse 64  Ht 5\' 7"  (1.702 m)  Wt 163 lb (73.936 kg)  BMI 25.52 kg/m2  LMP 12/12/2014  Constitutional:  Alert and oriented, No acute distress.  Normal habitus. HEENT: Trail AT, moist mucus membranes.  Trachea midline, no masses. Cardiovascular: No clubbing, cyanosis, or edema. Respiratory: Normal respiratory effort, no increased work of breathing. GI: Abdomen is soft, nontender, nondistended, no abdominal  masses GU: No CVA tenderness.  Pelvic: Mild urethral hypermobility with valsalva, no demonstrable SUI with empty bladder when examined in supine position.  Stage I cystocele with lateral defect.  No significant rectocele.   No obvious fistulous tract.  Cervix surgically absent.  Whitish vaginal discharge without odor.   Skin: No rashes, bruises or suspicious lesions. Lymph: No cervical or inguinal adenopathy. Neurologic: Grossly intact, no focal deficits, moving all 4 extremities. Psychiatric: Normal mood and affect.  Laboratory Data: Lab Results  Component Value Date   WBC 5.6 08/24/2015   HGB 10.9* 06/07/2015   HCT 41.0 08/24/2015   MCV 90 08/24/2015   PLT 206 08/24/2015    Lab Results  Component Value Date   CREATININE 0.85 06/07/2015     Lab Results  Component Value Date   HGBA1C 5.4 08/24/2015    Urinalysis Results for orders placed or performed in visit on 10/04/15  Microscopic Examination  Result Value Ref Range   WBC, UA 0-5 0 -  5 /hpf   RBC, UA 3-10 (A) 0 -  2 /hpf   Epithelial Cells (non renal) >10 (A) 0 - 10 /hpf   Bacteria, UA None seen None seen/Few  Urinalysis, Complete  Result Value Ref Range    Specific Gravity, UA 1.025 1.005 - 1.030   pH, UA 5.0 5.0 - 7.5   Color, UA Yellow Yellow   Appearance Ur Cloudy (A) Clear   Leukocytes, UA Trace (A) Negative   Protein, UA Negative Negative/Trace   Glucose, UA Negative Negative   Ketones, UA Negative Negative   RBC, UA 2+ (A) Negative   Bilirubin, UA Negative Negative   Urobilinogen, Ur 0.2 0.2 - 1.0 mg/dL   Nitrite, UA Negative Negative   Microscopic Examination See below:   BLADDER SCAN AMB NON-IMAGING  Result Value Ref Range   Scan Result 64ml     Pertinent Imaging: n/a  Assessment & Plan:    1. Stress incontinence in female Worsening stress urinary incontinence following hysterectomy. We discussed the anatomic reasons why this may have occurred. Given her constant dribbling even without activity, I would like to rule out a fistula although suspect this is unlikely.  We have discussed how to perform a pyridium /tampon test in order to rule out a fistula.  She will perform this test and return with results. If her tampon is discolored, she will take a photograph of the tampon.  At this point, I would like her to see physical therapy to learn Kegel exercises. If this fails, we may discuss other forms of surgical intervention is needed.  - Urinalysis, Complete - BLADDER SCAN AMB NON-IMAGING - Ambulatory referral to Physical Therapy  2. Urinary urgency Baseline urinary urgency. She is not particularly bothered by this. This is been lifelong.  Behavioral modification discussed. Discussed the importance of bowel hygiene as it relates to bladder function.  3. Microscopic hematuria Incidental microscopic hematuria today.  Greater than 10 squamous epithelial cells on UA today suggestive of possible contamination. This has not been seen on previous urinalyses. Plan to repeat UA at next visit. We will discuss need for further workup if microscopic hematuria persists.   Return in about 6 weeks (around 11/15/2015) for f/u PT.  Hollice Espy, MD  [facility] [address] [place], [place] [address] [phone]

## 2015-11-10 ENCOUNTER — Ambulatory Visit: Payer: No Typology Code available for payment source | Admitting: Physical Therapy

## 2015-11-17 ENCOUNTER — Encounter: Payer: No Typology Code available for payment source | Admitting: Physical Therapy

## 2015-11-22 ENCOUNTER — Ambulatory Visit: Payer: No Typology Code available for payment source | Admitting: Physical Therapy

## 2015-11-22 ENCOUNTER — Ambulatory Visit: Payer: No Typology Code available for payment source | Admitting: Urology

## 2015-11-29 ENCOUNTER — Encounter: Payer: No Typology Code available for payment source | Admitting: Physical Therapy

## 2015-12-08 ENCOUNTER — Encounter: Payer: No Typology Code available for payment source | Admitting: Physical Therapy

## 2015-12-15 ENCOUNTER — Encounter: Payer: No Typology Code available for payment source | Admitting: Physical Therapy

## 2015-12-29 ENCOUNTER — Encounter: Payer: No Typology Code available for payment source | Admitting: Physical Therapy

## 2016-01-06 ENCOUNTER — Other Ambulatory Visit: Payer: Self-pay | Admitting: Obstetrics & Gynecology

## 2016-01-12 ENCOUNTER — Encounter: Payer: No Typology Code available for payment source | Admitting: Physical Therapy

## 2016-01-26 ENCOUNTER — Encounter: Payer: No Typology Code available for payment source | Admitting: Physical Therapy

## 2016-03-14 ENCOUNTER — Other Ambulatory Visit: Payer: Self-pay | Admitting: Internal Medicine

## 2016-03-14 MED ORDER — LEVOTHYROXINE SODIUM 25 MCG PO TABS: 25.0000 ug | ORAL_TABLET | Freq: Every day | ORAL | 0 refills | Status: DC

## 2016-03-15 ENCOUNTER — Ambulatory Visit (INDEPENDENT_AMBULATORY_CARE_PROVIDER_SITE_OTHER): Payer: No Typology Code available for payment source | Admitting: Family Medicine

## 2016-03-15 ENCOUNTER — Encounter: Payer: Self-pay | Admitting: Family Medicine

## 2016-03-15 VITALS — BP 110/80 | HR 78 | Ht 67.0 in | Wt 161.0 lb

## 2016-03-15 DIAGNOSIS — E039 Hypothyroidism, unspecified: Secondary | ICD-10-CM

## 2016-03-15 MED ORDER — LEVOTHYROXINE SODIUM 25 MCG PO TABS: 25.0000 ug | ORAL_TABLET | Freq: Every day | ORAL | 3 refills | Status: DC

## 2016-03-15 NOTE — Progress Notes (Signed)
Name: [name]   MRN: [id number]    DOB: 1966/04/01   Date:03/15/2016       Progress Note  Subjective  Chief Complaint  Chief Complaint  Patient presents with  . Hypothyroidism    needs tsh and meds sent to [facility]    Thyroid Problem  Presents for follow-up visit. Symptoms include cold intolerance and hair loss. Patient reports no anxiety, constipation, depressed mood, diaphoresis, diarrhea, dry skin, fatigue, heat intolerance, hoarse voice, leg swelling, menstrual problem, nail problem, palpitations, tremors, visual change, weight gain or weight loss. The symptoms have been stable.    No problem-specific Assessment & Plan notes found for this encounter.   Past Medical History:  Diagnosis Date  . Abnormal Pap smear 2002   LGSIL  . Allergy   . GERD (gastroesophageal reflux disease)    last week or so only   . Hemorrhoid   . Hypothyroidism   . Shortness of breath dyspnea    with exercise since starting megace     Past Surgical History:  Procedure Laterality Date  . BREAST SURGERY  1994   left breast bx  . COLONOSCOPY W/ BIOPSIES  2013   divert. cleared for 5 yrs- [facility]  . COLPOSCOPY  2002   ABNORMAL PAP  . CYSTOSCOPY  06/06/2015   Procedure: CYSTOSCOPY;  Surgeon: Osborne Oman, MD;  Location: [facility];  Service: Gynecology;;  . DILATION AND CURETTAGE OF UTERUS     TAB  . LAPAROSCOPIC VAGINAL HYSTERECTOMY WITH SALPINGO OOPHORECTOMY N/A 06/06/2015   Procedure: LAPAROSCOPIC ASSISTED VAGINAL HYSTERECTOMY ;  Surgeon: Osborne Oman, MD;  Location: [facility];  Service: Gynecology;  Laterality: N/A;  722.9grams   . LEEP  2002  . TUBAL LIGATION    . VAGINAL HYSTERECTOMY      Family History  Problem Relation Age of Onset  . Hypertension Father   . Diabetes Father   . Cancer Maternal Grandmother     female cancer  . Stroke Maternal Grandfather   . Diabetes Paternal Aunt   . Heart disease Paternal Aunt   . Diabetes Paternal Uncle   . Heart disease Paternal Uncle    . Colon cancer Neg Hx   . Esophageal cancer Neg Hx   . Stomach cancer Neg Hx   . Prostate cancer Neg Hx   . Kidney cancer Neg Hx   . Bladder Cancer Neg Hx     Social History   Social History  . Marital status: Married    Spouse name: N/A  . Number of children: 2  . Years of education: N/A   Occupational History  . Associate Professor   Social History Main Topics  . Smoking status: Never Smoker  . Smokeless tobacco: Never Used  . Alcohol use No  . Drug use: No  . Sexual activity: Not Currently    Partners: Male    Birth control/ protection: Surgical   Other Topics Concern  . Not on file   Social History Narrative  . No narrative on file    Allergies  Allergen Reactions  . Doxycycline Other (See Comments)    Thrush  . Ciprofloxacin Other (See Comments)    Yeast infection  . Sulfa Antibiotics Other (See Comments)    Yeast infections     Review of Systems  Constitutional: Negative for chills, diaphoresis, fatigue, fever, malaise/fatigue, weight gain and weight loss.  HENT: Negative for ear discharge, ear pain, hoarse voice and sore throat.  Eyes: Negative for blurred vision and photophobia.  Respiratory: Negative for cough, sputum production, shortness of breath and wheezing.   Cardiovascular: Negative for chest pain, palpitations and leg swelling.  Gastrointestinal: Negative for abdominal pain, blood in stool, constipation, diarrhea, heartburn, melena and nausea.  Genitourinary: Negative for dysuria, frequency, hematuria, menstrual problem and urgency.  Musculoskeletal: Negative for back pain, joint pain, myalgias and neck pain.  Skin: Negative for rash.       metrogel  Neurological: Negative for dizziness, tingling, tremors, sensory change, focal weakness and headaches.  Endo/Heme/Allergies: Positive for cold intolerance. Negative for environmental allergies, heat intolerance and polydipsia. Does not bruise/bleed easily.   Psychiatric/Behavioral: Negative for depression and suicidal ideas. The patient has insomnia. The patient is not nervous/anxious.      Objective  Vitals:   03/15/16 1054  BP: 110/80  Pulse: 78  Weight: 161 lb (73 kg)  Height: 5\' 7"  (1.702 m)    Physical Exam  Constitutional: She is well-developed, well-nourished, and in no distress. No distress.  HENT:  Head: Normocephalic and atraumatic.  Right Ear: External ear normal.  Left Ear: External ear normal.  Nose: Nose normal.  Mouth/Throat: Oropharynx is clear and moist.  Eyes: Conjunctivae and EOM are normal. Pupils are equal, round, and reactive to light. Right eye exhibits no discharge. Left eye exhibits no discharge.  Neck: Normal range of motion. Neck supple. No JVD present. No thyromegaly present.  Cardiovascular: Normal rate, regular rhythm, normal heart sounds and intact distal pulses.  Exam reveals no gallop and no friction rub.   No murmur heard. Pulmonary/Chest: Effort normal and breath sounds normal. She has no wheezes. She has no rales.  Abdominal: Soft. Bowel sounds are normal. She exhibits no mass. There is no tenderness. There is no guarding.  Musculoskeletal: Normal range of motion. She exhibits no edema.  Lymphadenopathy:    She has no cervical adenopathy.  Neurological: She is alert. She has normal reflexes.  Skin: Skin is warm and dry. She is not diaphoretic.  Psychiatric: Mood and affect normal.  Nursing note and vitals reviewed.     Assessment & Plan  Problem List Items Addressed This Visit      Endocrine   Hypothyroidism - Primary   Relevant Medications   levothyroxine (LEVOTHROID) 25 MCG tablet   Other Relevant Orders   TSH    Other Visit Diagnoses   None.       Dr. Macon [facility]  03/15/16

## 2016-03-16 LAB — TSH: TSH: 3.94 u[IU]/mL (ref 0.450–4.500)

## 2016-06-10 ENCOUNTER — Encounter: Payer: Self-pay | Admitting: Internal Medicine

## 2016-07-19 ENCOUNTER — Encounter: Payer: Self-pay | Admitting: Internal Medicine

## 2016-09-09 ENCOUNTER — Ambulatory Visit (AMBULATORY_SURGERY_CENTER): Payer: Self-pay

## 2016-09-09 VITALS — Ht 66.5 in | Wt 164.4 lb

## 2016-09-09 DIAGNOSIS — Z860100 Personal history of colon polyps, unspecified: Secondary | ICD-10-CM

## 2016-09-09 MED ORDER — SUPREP BOWEL PREP KIT 17.5-3.13-1.6 GM/177ML PO SOLN: 1.0000 | Freq: Once | ORAL | 0 refills | Status: AC

## 2016-09-09 NOTE — Progress Notes (Signed)
No allergies to eggs or soy No past problems with anesthesia No diet meds No home oxygen  Registered emmi 

## 2016-09-23 ENCOUNTER — Ambulatory Visit (AMBULATORY_SURGERY_CENTER): Payer: 59 | Admitting: Internal Medicine

## 2016-09-23 ENCOUNTER — Encounter: Payer: Self-pay | Admitting: Internal Medicine

## 2016-09-23 VITALS — BP 101/65 | HR 57 | Temp 98.0°F | Resp 13 | Ht 67.0 in | Wt 161.0 lb

## 2016-09-23 DIAGNOSIS — Z86010 Personal history of colonic polyps: Secondary | ICD-10-CM | POA: Diagnosis present

## 2016-09-23 DIAGNOSIS — D123 Benign neoplasm of transverse colon: Secondary | ICD-10-CM | POA: Diagnosis not present

## 2016-09-23 DIAGNOSIS — K635 Polyp of colon: Secondary | ICD-10-CM

## 2016-09-23 DIAGNOSIS — D125 Benign neoplasm of sigmoid colon: Secondary | ICD-10-CM

## 2016-09-23 LAB — HM COLONOSCOPY

## 2016-09-23 MED ORDER — SODIUM CHLORIDE 0.9 % IV SOLN: 500.0000 mL | INTRAVENOUS | Status: DC

## 2016-09-23 NOTE — Patient Instructions (Signed)
Discharge instructions given. Handouts on polyps,diverticulosis and hemorrhoids. Resume previous medications. YOU HAD AN ENDOSCOPIC PROCEDURE TODAY AT THE [facility]:   Refer to the procedure report that was given to you for any specific questions about what was found during the examination.  If the procedure report does not answer your questions, please call your gastroenterologist to clarify.  If you requested that your care partner not be given the details of your procedure findings, then the procedure report has been included in a sealed envelope for you to review at your convenience later.  YOU SHOULD EXPECT: Some feelings of bloating in the abdomen. Passage of more gas than usual.  Walking can help get rid of the air that was put into your GI tract during the procedure and reduce the bloating. If you had a lower endoscopy (such as a colonoscopy or flexible sigmoidoscopy) you may notice spotting of blood in your stool or on the toilet paper. If you underwent a bowel prep for your procedure, you may not have a normal bowel movement for a few days.  Please Note:  You might notice some irritation and congestion in your nose or some drainage.  This is from the oxygen used during your procedure.  There is no need for concern and it should clear up in a day or so.  SYMPTOMS TO REPORT IMMEDIATELY:   Following lower endoscopy (colonoscopy or flexible sigmoidoscopy):  Excessive amounts of blood in the stool  Significant tenderness or worsening of abdominal pains  Swelling of the abdomen that is new, acute  Fever of 100F or higher   For urgent or emergent issues, a gastroenterologist can be reached at any hour by calling [phone].   DIET:  We do recommend a small meal at first, but then you may proceed to your regular diet.  Drink plenty of fluids but you should avoid alcoholic beverages for 24 hours.  ACTIVITY:  You should plan to take it easy for the rest of today and you  should NOT DRIVE or use heavy machinery until tomorrow (because of the sedation medicines used during the test).    FOLLOW UP: Our staff will call the number listed on your records the next business day following your procedure to check on you and address any questions or concerns that you may have regarding the information given to you following your procedure. If we do not reach you, we will leave a message.  However, if you are feeling well and you are not experiencing any problems, there is no need to return our call.  We will assume that you have returned to your regular daily activities without incident.  If any biopsies were taken you will be contacted by phone or by letter within the next 1-3 weeks.  Please call us at [phone] if you have not heard about the biopsies in 3 weeks.    SIGNATURES/CONFIDENTIALITY: You and/or your care partner have signed paperwork which will be entered into your electronic medical record.  These signatures attest to the fact that that the information above on your After Visit Summary has been reviewed and is understood.  Full responsibility of the confidentiality of this discharge information lies with you and/or your care-partner. 

## 2016-09-23 NOTE — Progress Notes (Signed)
Report given to PACU, vss 

## 2016-09-23 NOTE — Progress Notes (Signed)
Called to room to assist during endoscopic procedure.  Patient ID and intended procedure confirmed with present staff. Received instructions for my participation in the procedure from the performing physician.  

## 2016-09-23 NOTE — Op Note (Signed)
[place] Patient Name: [name] Procedure Date: 09/23/2016 10:34 AM MRN: [id number] Endoscopist: Jerene Bears , MD Age: 51 Referring MD:  Date of Birth: 1965/12/02 Gender: Female Account #: [id number] Procedure:                Colonoscopy Indications:              High risk colon cancer surveillance: Personal                            history of sessile serrated colon polyp (less than                            10 mm in size) with no dysplasia, Last colonoscopy                            5 years ago Medicines:                Monitored Anesthesia Care Procedure:                Pre-Anesthesia Assessment:                           - Prior to the procedure, a History and Physical                            was performed, and patient medications and                            allergies were reviewed. The patient's tolerance of                            previous anesthesia was also reviewed. The risks                            and benefits of the procedure and the sedation                            options and risks were discussed with the patient.                            All questions were answered, and informed consent                            was obtained. Prior Anticoagulants: The patient has                            taken no previous anticoagulant or antiplatelet                            agents. ASA Grade Assessment: II - A patient with                            mild systemic disease. After reviewing the risks  and benefits, the patient was deemed in                            satisfactory condition to undergo the procedure.                           After obtaining informed consent, the colonoscope                            was passed under direct vision. Throughout the                            procedure, the patient's blood pressure, pulse, and                            oxygen saturations were monitored continuously. The                             Colonoscope was introduced through the anus and                            advanced to the the cecum, identified by                            appendiceal orifice and ileocecal valve. The                            colonoscopy was performed without difficulty. The                            patient tolerated the procedure well. The quality                            of the bowel preparation was good. The ileocecal                            valve, appendiceal orifice, and rectum were                            photographed. Scope In: 10:42:08 AM Scope Out: 11:01:18 AM Scope Withdrawal Time: 0 hours 15 minutes 20 seconds  Total Procedure Duration: 0 hours 19 minutes 10 seconds  Findings:                 The perianal and digital rectal examinations were                            normal.                           Three flat polyps were found in the transverse                            colon. The polyps were 3 to 8 mm in size. These  polyps were removed with a cold snare. Resection                            and retrieval were complete.                           Two sessile polyps were found in the sigmoid colon.                            The polyps were 3 to 5 mm in size. These polyps                            were removed with a cold snare. Resection and                            retrieval were complete.                           Multiple medium-mouthed diverticula were found in                            the sigmoid colon and descending colon. There was                            no evidence of diverticular bleeding.                           Internal hemorrhoids were found during                            retroflexion. The hemorrhoids were small. Complications:            No immediate complications. Estimated Blood Loss:     Estimated blood loss was minimal. Impression:               - Three 3 to 8 mm polyps in the transverse  colon,                            removed with a cold snare. Resected and retrieved.                           - Two 3 to 5 mm polyps in the sigmoid colon,                            removed with a cold snare. Resected and retrieved.                           - Mild diverticulosis in the sigmoid colon and in                            the descending colon. There was no evidence of                            diverticular bleeding.                           -  Internal hemorrhoids. Recommendation:           - Patient has a contact number available for                            emergencies. The signs and symptoms of potential                            delayed complications were discussed with the                            patient. Return to normal activities tomorrow.                            Written discharge instructions were provided to the                            patient.                           - Resume previous diet.                           - Continue present medications.                           - Await pathology results.                           - Repeat colonoscopy is recommended for                            surveillance. The colonoscopy date will be                            determined after pathology results from today's                            exam become available for review. Jerene Bears, MD 09/23/2016 11:14:43 AM This report has been signed electronically.

## 2016-09-23 NOTE — Progress Notes (Signed)
Pt's states no medical or surgical changes since previsit or office visit. 

## 2016-09-24 ENCOUNTER — Telehealth: Payer: Self-pay | Admitting: *Deleted

## 2016-09-24 NOTE — Telephone Encounter (Signed)
Left message will call back later this afternoon for follow up. Sm

## 2016-09-24 NOTE — Telephone Encounter (Signed)
No answer, message left for the patient. 

## 2016-09-30 ENCOUNTER — Encounter: Payer: Self-pay | Admitting: Internal Medicine

## 2016-10-30 ENCOUNTER — Ambulatory Visit (INDEPENDENT_AMBULATORY_CARE_PROVIDER_SITE_OTHER): Payer: 59 | Admitting: Family Medicine

## 2016-10-30 ENCOUNTER — Encounter: Payer: Self-pay | Admitting: Family Medicine

## 2016-10-30 VITALS — BP 110/64 | HR 80 | Ht 67.0 in | Wt 163.0 lb

## 2016-10-30 DIAGNOSIS — J0100 Acute maxillary sinusitis, unspecified: Secondary | ICD-10-CM | POA: Diagnosis not present

## 2016-10-30 DIAGNOSIS — H6123 Impacted cerumen, bilateral: Secondary | ICD-10-CM

## 2016-10-30 MED ORDER — AMOXICILLIN 500 MG PO CAPS: 500.0000 mg | ORAL_CAPSULE | Freq: Three times a day (TID) | ORAL | 0 refills | Status: DC

## 2016-10-30 NOTE — Progress Notes (Signed)
Name: [name]   MRN: [id number]    DOB: 1965/12/07   Date:10/30/2016       Progress Note  Subjective  Chief Complaint  Chief Complaint  Patient presents with  . Sinusitis    L) ear feels full "like I'm in a barrel"- also has "light congestion"     Sinusitis  This is a new problem. The current episode started in the past 7 days. The problem has been gradually worsening since onset. There has been no fever. Her pain is at a severity of 2/10. The pain is mild. Associated symptoms include congestion, ear pain, headaches, neck pain, sinus pressure, a sore throat and swollen glands. Pertinent negatives include no chills, coughing, diaphoresis, shortness of breath or sneezing. Past treatments include oral decongestants. The treatment provided mild relief.    No problem-specific Assessment & Plan notes found for this encounter.   Past Medical History:  Diagnosis Date  . Abnormal Pap smear 2002   LGSIL  . Allergy   . GERD (gastroesophageal reflux disease)    when on a medicine for uterine bleeding  . Hemorrhoid   . History of blood transfusion    for uterine bleeding; now resolved  . Hypothyroidism   . Shortness of breath dyspnea    with exercise since starting megace     Past Surgical History:  Procedure Laterality Date  . BREAST SURGERY  1994   left breast bx  . COLONOSCOPY W/ BIOPSIES  2013   divert. cleared for 5 yrs- [facility]  . COLPOSCOPY  2002   ABNORMAL PAP  . CYSTOSCOPY  06/06/2015   Procedure: CYSTOSCOPY;  Surgeon: Osborne Oman, MD;  Location: [facility];  Service: Gynecology;;  . DILATION AND CURETTAGE OF UTERUS     TAB  . LAPAROSCOPIC VAGINAL HYSTERECTOMY WITH SALPINGO OOPHORECTOMY N/A 06/06/2015   Procedure: LAPAROSCOPIC ASSISTED VAGINAL HYSTERECTOMY ;  Surgeon: Osborne Oman, MD;  Location: [facility];  Service: Gynecology;  Laterality: N/A;  722.9grams   . LEEP  2002  . TUBAL LIGATION    . VAGINAL HYSTERECTOMY      Family History  Problem Relation Age  of Onset  . Hypertension Father   . Diabetes Father   . Cancer Maternal Grandmother        female cancer  . Stroke Maternal Grandfather   . Diabetes Paternal Aunt   . Heart disease Paternal Aunt   . Diabetes Paternal Uncle   . Heart disease Paternal Uncle   . Colon cancer Neg Hx   . Esophageal cancer Neg Hx   . Stomach cancer Neg Hx   . Prostate cancer Neg Hx   . Kidney cancer Neg Hx   . Bladder Cancer Neg Hx     Social History   Social History  . Marital status: Married    Spouse name: N/A  . Number of children: 2  . Years of education: N/A   Occupational History  . Associate Professor   Social History Main Topics  . Smoking status: Never Smoker  . Smokeless tobacco: Never Used  . Alcohol use No  . Drug use: No  . Sexual activity: Not Currently    Partners: Male    Birth control/ protection: Surgical   Other Topics Concern  . Not on file   Social History Narrative  . No narrative on file    Allergies  Allergen Reactions  . Doxycycline Other (See Comments)    Thrush  .  Ciprofloxacin Other (See Comments)    Yeast infection  . Sulfa Antibiotics Other (See Comments)    Yeast infections    Outpatient Medications Prior to Visit  Medication Sig Dispense Refill  . Acetaminophen (TYLENOL ARTHRITIS PAIN PO) Take 2 tablets by mouth daily as needed (pain).    Marland Kitchen docusate sodium (COLACE) 250 MG capsule Take 250 mg by mouth daily.    Marland Kitchen levothyroxine (LEVOTHROID) 25 MCG tablet Take 1 tablet (25 mcg total) by mouth daily before breakfast. 90 tablet 3  . loratadine (CLARITIN) 10 MG tablet Take 10 mg by mouth daily as needed for allergies. otc    . Magnesium 250 MG TABS Take by mouth.    . METRONIDAZOLE, TOPICAL, 0.75 % LOTN Apply 1 application topically 2 (two) times daily. (Patient taking differently: Apply 1 application topically daily as needed. ) 59 mL 3  . Multiple Vitamins-Minerals (MULTIVITAMIN WITH MINERALS) tablet Take 1 tablet by mouth  daily.    Marland Kitchen OVER THE COUNTER MEDICATION For sleep Camomile and Melatonin and another ingredient pt unsure of    . Probiotic Product (ALIGN) 4 MG CAPS Take 1 capsule by mouth daily. 30 capsule 6   Facility-Administered Medications Prior to Visit  Medication Dose Route Frequency Provider Last Rate Last Dose  . 0.9 %  sodium chloride infusion  500 mL Intravenous Continuous Pyrtle, Lajuan Lines, MD        Review of Systems  Constitutional: Negative for chills, diaphoresis, fever, malaise/fatigue and weight loss.  HENT: Positive for congestion, ear pain, sinus pressure and sore throat. Negative for ear discharge and sneezing.   Eyes: Negative for blurred vision.  Respiratory: Negative for cough, sputum production, shortness of breath and wheezing.   Cardiovascular: Negative for chest pain, palpitations and leg swelling.  Gastrointestinal: Negative for abdominal pain, blood in stool, constipation, diarrhea, heartburn, melena and nausea.  Genitourinary: Negative for dysuria, frequency, hematuria and urgency.  Musculoskeletal: Positive for neck pain. Negative for back pain, joint pain and myalgias.  Skin: Negative for rash.  Neurological: Positive for headaches. Negative for dizziness, tingling, sensory change and focal weakness.  Endo/Heme/Allergies: Negative for environmental allergies and polydipsia. Does not bruise/bleed easily.  Psychiatric/Behavioral: Negative for depression and suicidal ideas. The patient is not nervous/anxious and does not have insomnia.      Objective  Vitals:   10/30/16 0911  BP: 110/64  Pulse: 80  Weight: 163 lb (73.9 kg)  Height: 5\' 7"  (1.702 m)    Physical Exam  Constitutional: She is well-developed, well-nourished, and in no distress. No distress.  HENT:  Head: Normocephalic and atraumatic.  Right Ear: Tympanic membrane and external ear normal.  Left Ear: Tympanic membrane and external ear normal.  Nose: Right sinus exhibits maxillary sinus tenderness. Left  sinus exhibits maxillary sinus tenderness.  Mouth/Throat: Posterior oropharyngeal erythema present. No oropharyngeal exudate or posterior oropharyngeal edema.  Cerumen noted  Eyes: Conjunctivae and EOM are normal. Pupils are equal, round, and reactive to light. Right eye exhibits no discharge. Left eye exhibits no discharge.  Neck: Normal range of motion. Neck supple. Normal carotid pulses, no hepatojugular reflux and no JVD present. Carotid bruit is not present. No thyromegaly present.  Cardiovascular: Normal rate, regular rhythm, S1 normal, S2 normal, normal heart sounds and intact distal pulses.  Exam reveals no gallop, no S3, no S4 and no friction rub.   No murmur heard. Pulmonary/Chest: Effort normal and breath sounds normal. She has no wheezes. She has no rales.  Abdominal: Soft. Bowel  sounds are normal. She exhibits no mass. There is no tenderness. There is no guarding.  Musculoskeletal: Normal range of motion. She exhibits no edema.  Lymphadenopathy:       Head (right side): No submental and no submandibular adenopathy present.       Head (left side): Submandibular adenopathy present. No submental adenopathy present.    She has no cervical adenopathy.  Neurological: She is alert. She has normal reflexes.  Skin: Skin is warm and dry. She is not diaphoretic.  Psychiatric: Mood and affect normal.  Nursing note and vitals reviewed.     Assessment & Plan  Problem List Items Addressed This Visit    None    Visit Diagnoses    Acute non-recurrent maxillary sinusitis    -  Primary   Relevant Medications   amoxicillin (AMOXIL) 500 MG capsule   Bilateral impacted cerumen          Meds ordered this encounter  Medications  . amoxicillin (AMOXIL) 500 MG capsule    Sig: Take 1 capsule (500 mg total) by mouth 3 (three) times daily.    Dispense:  30 capsule    Refill:  0      Dr. Otilio Miu [facility]  10/30/16

## 2017-02-26 ENCOUNTER — Encounter: Payer: Self-pay | Admitting: Family Medicine

## 2017-02-26 ENCOUNTER — Other Ambulatory Visit: Payer: Self-pay

## 2017-02-26 ENCOUNTER — Ambulatory Visit (INDEPENDENT_AMBULATORY_CARE_PROVIDER_SITE_OTHER): Payer: 59 | Admitting: Family Medicine

## 2017-02-26 VITALS — BP 120/64 | HR 72 | Ht 67.0 in | Wt 164.0 lb

## 2017-02-26 DIAGNOSIS — J0100 Acute maxillary sinusitis, unspecified: Secondary | ICD-10-CM

## 2017-02-26 MED ORDER — AZITHROMYCIN 250 MG PO TABS: ORAL_TABLET | ORAL | 0 refills | Status: DC

## 2017-02-26 NOTE — Progress Notes (Signed)
Name: [name]   MRN: [id number]    DOB: 1966-04-05   Date:02/26/2017       Progress Note  Subjective  Chief Complaint  Chief Complaint  Patient presents with  . Sinusitis    cong, sore throat, x 3 days.- feels a lump or knot under R) ear- hurts to swallow and has a sinus headache    Sinusitis  This is a new problem. The current episode started in the past 7 days. The problem has been gradually worsening since onset. There has been no fever. Associated symptoms include congestion, coughing, diaphoresis, headaches, a hoarse voice, neck pain, sinus pressure, a sore throat and swollen glands. Pertinent negatives include no chills, ear pain, shortness of breath or sneezing. Past treatments include acetaminophen. The treatment provided moderate relief.    No problem-specific Assessment & Plan notes found for this encounter.   Past Medical History:  Diagnosis Date  . Abnormal Pap smear 2002   LGSIL  . Allergy   . GERD (gastroesophageal reflux disease)    when on a medicine for uterine bleeding  . Hemorrhoid   . History of blood transfusion    for uterine bleeding; now resolved  . Hypothyroidism   . Shortness of breath dyspnea    with exercise since starting megace     Past Surgical History:  Procedure Laterality Date  . BREAST SURGERY  1994   left breast bx  . COLONOSCOPY W/ BIOPSIES  2013   divert. cleared for 5 yrs- [facility]  . COLPOSCOPY  2002   ABNORMAL PAP  . CYSTOSCOPY  06/06/2015   Procedure: CYSTOSCOPY;  Surgeon: Osborne Oman, MD;  Location: [facility];  Service: Gynecology;;  . DILATION AND CURETTAGE OF UTERUS     TAB  . LAPAROSCOPIC VAGINAL HYSTERECTOMY WITH SALPINGO OOPHORECTOMY N/A 06/06/2015   Procedure: LAPAROSCOPIC ASSISTED VAGINAL HYSTERECTOMY ;  Surgeon: Osborne Oman, MD;  Location: [facility];  Service: Gynecology;  Laterality: N/A;  722.9grams   . LEEP  2002  . TUBAL LIGATION    . VAGINAL HYSTERECTOMY      Family History  Problem Relation Age  of Onset  . Hypertension Father   . Diabetes Father   . Cancer Maternal Grandmother        female cancer  . Stroke Maternal Grandfather   . Diabetes Paternal Aunt   . Heart disease Paternal Aunt   . Diabetes Paternal Uncle   . Heart disease Paternal Uncle   . Colon cancer Neg Hx   . Esophageal cancer Neg Hx   . Stomach cancer Neg Hx   . Prostate cancer Neg Hx   . Kidney cancer Neg Hx   . Bladder Cancer Neg Hx     Social History   Social History  . Marital status: Married    Spouse name: N/A  . Number of children: 2  . Years of education: N/A   Occupational History  . Associate Professor   Social History Main Topics  . Smoking status: Never Smoker  . Smokeless tobacco: Never Used  . Alcohol use No  . Drug use: No  . Sexual activity: Not Currently    Partners: Male    Birth control/ protection: Surgical   Other Topics Concern  . Not on file   Social History Narrative  . No narrative on file    Allergies  Allergen Reactions  . Doxycycline Other (See Comments)    Thrush  . Ciprofloxacin Other (  See Comments)    Yeast infection  . Sulfa Antibiotics Other (See Comments)    Yeast infections    Outpatient Medications Prior to Visit  Medication Sig Dispense Refill  . Acetaminophen (TYLENOL ARTHRITIS PAIN PO) Take 2 tablets by mouth daily as needed (pain).    Marland Kitchen docusate sodium (COLACE) 250 MG capsule Take 250 mg by mouth daily.    Marland Kitchen levothyroxine (LEVOTHROID) 25 MCG tablet Take 1 tablet (25 mcg total) by mouth daily before breakfast. 90 tablet 3  . loratadine (CLARITIN) 10 MG tablet Take 10 mg by mouth daily as needed for allergies. otc    . Magnesium 250 MG TABS Take by mouth.    . METRONIDAZOLE, TOPICAL, 0.75 % LOTN Apply 1 application topically 2 (two) times daily. (Patient taking differently: Apply 1 application topically daily as needed. ) 59 mL 3  . Multiple Vitamins-Minerals (MULTIVITAMIN WITH MINERALS) tablet Take 1 tablet by mouth  daily.    Marland Kitchen OVER THE COUNTER MEDICATION For sleep Camomile and Melatonin and another ingredient pt unsure of    . Probiotic Product (ALIGN) 4 MG CAPS Take 1 capsule by mouth daily. 30 capsule 6  . amoxicillin (AMOXIL) 500 MG capsule Take 1 capsule (500 mg total) by mouth 3 (three) times daily. 30 capsule 0   Facility-Administered Medications Prior to Visit  Medication Dose Route Frequency Provider Last Rate Last Dose  . 0.9 %  sodium chloride infusion  500 mL Intravenous Continuous Pyrtle, Lajuan Lines, MD        Review of Systems  Constitutional: Positive for diaphoresis. Negative for chills, fever, malaise/fatigue and weight loss.  HENT: Positive for congestion, hoarse voice, sinus pressure and sore throat. Negative for ear discharge, ear pain and sneezing.   Eyes: Negative for blurred vision.  Respiratory: Positive for cough. Negative for sputum production, shortness of breath and wheezing.   Cardiovascular: Negative for chest pain, palpitations and leg swelling.  Gastrointestinal: Negative for abdominal pain, blood in stool, constipation, diarrhea, heartburn, melena and nausea.  Genitourinary: Negative for dysuria, frequency, hematuria and urgency.  Musculoskeletal: Positive for neck pain. Negative for back pain, joint pain and myalgias.  Skin: Negative for rash.  Neurological: Positive for headaches. Negative for dizziness, tingling, sensory change and focal weakness.  Endo/Heme/Allergies: Negative for environmental allergies and polydipsia. Does not bruise/bleed easily.  Psychiatric/Behavioral: Negative for depression and suicidal ideas. The patient is not nervous/anxious and does not have insomnia.      Objective  Vitals:   02/26/17 1119  BP: 120/64  Pulse: 72  Weight: 164 lb (74.4 kg)  Height: 5\' 7"  (1.702 m)    Physical Exam  Constitutional: She is well-developed, well-nourished, and in no distress. No distress.  HENT:  Head: Normocephalic and atraumatic.  Right Ear:  External ear normal.  Left Ear: External ear normal.  Nose: Nose normal.  Mouth/Throat: Oropharynx is clear and moist.  Eyes: Pupils are equal, round, and reactive to light. Conjunctivae and EOM are normal. Right eye exhibits no discharge. Left eye exhibits no discharge.  Neck: Normal range of motion. Neck supple. No JVD present. No thyromegaly present.  Cardiovascular: Normal rate, regular rhythm, normal heart sounds and intact distal pulses.  Exam reveals no gallop and no friction rub.   No murmur heard. Pulmonary/Chest: Effort normal and breath sounds normal. She has no wheezes. She has no rales.  Abdominal: Soft. Bowel sounds are normal. She exhibits no mass. There is no tenderness. There is no guarding.  Musculoskeletal: Normal range  of motion. She exhibits no edema.  Lymphadenopathy:    She has no cervical adenopathy.  Neurological: She is alert. She has normal reflexes.  Skin: Skin is warm and dry. She is not diaphoretic.  Psychiatric: Mood and affect normal.  Nursing note and vitals reviewed.     Assessment & Plan  Problem List Items Addressed This Visit    None    Visit Diagnoses    Acute non-recurrent maxillary sinusitis    -  Primary   Relevant Medications   azithromycin (ZITHROMAX) 250 MG tablet      Meds ordered this encounter  Medications  . azithromycin (ZITHROMAX) 250 MG tablet    Sig: 2 today then 1 a day for 4 days    Dispense:  6 tablet    Refill:  0      Dr. [facility]  02/26/17

## 2017-03-24 ENCOUNTER — Ambulatory Visit (INDEPENDENT_AMBULATORY_CARE_PROVIDER_SITE_OTHER): Payer: 59 | Admitting: Family Medicine

## 2017-03-24 ENCOUNTER — Encounter: Payer: Self-pay | Admitting: Family Medicine

## 2017-03-24 VITALS — BP 102/60 | HR 64 | Ht 67.0 in | Wt 167.0 lb

## 2017-03-24 DIAGNOSIS — E039 Hypothyroidism, unspecified: Secondary | ICD-10-CM | POA: Diagnosis not present

## 2017-03-24 MED ORDER — LEVOTHYROXINE SODIUM 50 MCG PO TABS
50.0000 ug | ORAL_TABLET | Freq: Every day | ORAL | 3 refills | Status: DC
Start: 2017-03-24 — End: 2018-01-28

## 2017-03-24 NOTE — Progress Notes (Signed)
Name: [name]   MRN: [id number]    DOB: [birth year]   Date:03/24/2017       Progress Note  Subjective  Chief Complaint  Chief Complaint  Patient presents with  . Hypothyroidism    needs labs and refill on thyroid med    Thyroid Problem  Presents for follow-up visit. Symptoms include cold intolerance, constipation, dry skin, fatigue, hoarse voice and weight gain. Patient reports no anxiety, depressed mood, diaphoresis, diarrhea, hair loss, heat intolerance, leg swelling, menstrual problem, nail problem, palpitations, tremors, visual change or weight loss. The symptoms have been stable.    No problem-specific Assessment & Plan notes found for this encounter.   Past Medical History:  Diagnosis Date  . Abnormal Pap smear 2002   LGSIL  . Allergy   . GERD (gastroesophageal reflux disease)    when on a medicine for uterine bleeding  . Hemorrhoid   . History of blood transfusion    for uterine bleeding; now resolved  . Hypothyroidism   . Shortness of breath dyspnea    with exercise since starting megace     Past Surgical History:  Procedure Laterality Date  . BREAST SURGERY  1994   left breast bx  . COLONOSCOPY W/ BIOPSIES  2013   divert. cleared for 5 yrs- [facility]  . COLPOSCOPY  2002   ABNORMAL PAP  . DILATION AND CURETTAGE OF UTERUS     TAB  . LEEP  2002  . TUBAL LIGATION    . VAGINAL HYSTERECTOMY      Family History  Problem Relation Age of Onset  . Hypertension Father   . Diabetes Father   . Cancer Maternal Grandmother        female cancer  . Stroke Maternal Grandfather   . Diabetes Paternal Aunt   . Heart disease Paternal Aunt   . Diabetes Paternal Uncle   . Heart disease Paternal Uncle   . Colon cancer Neg Hx   . Esophageal cancer Neg Hx   . Stomach cancer Neg Hx   . Prostate cancer Neg Hx   . Kidney cancer Neg Hx   . Bladder Cancer Neg Hx     Social History   Socioeconomic History  . Marital status: Married    Spouse name: Not on file  .  Number of children: 2  . Years of education: Not on file  . Highest education level: Not on file  Social Needs  . Financial resource strain: Not on file  . Food insecurity - worry: Not on file  . Food insecurity - inability: Not on file  . Transportation needs - medical: Not on file  . Transportation needs - non-medical: Not on file  Occupational History  . Occupation: Engineer, mining: [employer]  Tobacco Use  . Smoking status: Never Smoker  . Smokeless tobacco: Never Used  Substance and Sexual Activity  . Alcohol use: No    Alcohol/week: 0.0 oz  . Drug use: No  . Sexual activity: Not Currently    Partners: Male    Birth control/protection: Surgical  Other Topics Concern  . Not on file  Social History Narrative  . Not on file    Allergies  Allergen Reactions  . Doxycycline Other (See Comments)    Thrush  . Ciprofloxacin Other (See Comments)    Yeast infection  . Sulfa Antibiotics Other (See Comments)    Yeast infections    Outpatient Medications Prior to Visit  Medication Sig Dispense Refill  . Acetaminophen (TYLENOL ARTHRITIS PAIN PO) Take 2 tablets by mouth daily as needed (pain).    . Black Cohosh-SoyIsoflav-Magnol (ESTROVEN MENOPAUSE RELIEF PO) Take 1 capsule daily by mouth. otc    . docusate sodium (COLACE) 250 MG capsule Take 250 mg by mouth daily.    Marland Kitchen loratadine (CLARITIN) 10 MG tablet Take 10 mg by mouth daily as needed for allergies. otc    . Magnesium 250 MG TABS Take by mouth.    . METRONIDAZOLE, TOPICAL, 0.75 % LOTN Apply 1 application topically 2 (two) times daily. (Patient taking differently: Apply 1 application topically daily as needed. ) 59 mL 3  . Multiple Vitamins-Minerals (MULTIVITAMIN WITH MINERALS) tablet Take 1 tablet by mouth daily.    Marland Kitchen OVER THE COUNTER MEDICATION For sleep Camomile and Melatonin and another ingredient pt unsure of    . Probiotic Product (ALIGN) 4 MG CAPS Take 1 capsule by mouth daily. 30 capsule 6   . levothyroxine (LEVOTHROID) 25 MCG tablet Take 1 tablet (25 mcg total) by mouth daily before breakfast. 90 tablet 3  . azithromycin (ZITHROMAX) 250 MG tablet 2 today then 1 a day for 4 days 6 tablet 0   Facility-Administered Medications Prior to Visit  Medication Dose Route Frequency Provider Last Rate Last Dose  . 0.9 %  sodium chloride infusion  500 mL Intravenous Continuous Pyrtle, Lajuan Lines, MD        Review of Systems  Constitutional: Positive for fatigue and weight gain. Negative for chills, diaphoresis, fever, malaise/fatigue and weight loss.  HENT: Positive for hoarse voice. Negative for ear discharge, ear pain and sore throat.   Eyes: Negative for blurred vision.  Respiratory: Negative for cough, sputum production, shortness of breath and wheezing.   Cardiovascular: Negative for chest pain, palpitations and leg swelling.  Gastrointestinal: Positive for constipation. Negative for abdominal pain, blood in stool, diarrhea, heartburn, melena and nausea.  Genitourinary: Negative for dysuria, frequency, hematuria, menstrual problem and urgency.  Musculoskeletal: Negative for back pain, joint pain, myalgias and neck pain.  Skin: Negative for rash.  Neurological: Negative for dizziness, tingling, tremors, sensory change, focal weakness and headaches.  Endo/Heme/Allergies: Positive for cold intolerance. Negative for environmental allergies, heat intolerance and polydipsia. Does not bruise/bleed easily.  Psychiatric/Behavioral: Negative for depression and suicidal ideas. The patient is not nervous/anxious and does not have insomnia.      Objective  Vitals:   03/24/17 0817  BP: 102/60  Pulse: 64  Weight: 167 lb (75.8 kg)  Height: 5\' 7"  (1.702 m)    Physical Exam  Constitutional: She is well-developed, well-nourished, and in no distress. No distress.  HENT:  Head: Normocephalic and atraumatic.  Right Ear: External ear normal.  Left Ear: External ear normal.  Nose: Nose normal.   Mouth/Throat: Oropharynx is clear and moist.  Eyes: Conjunctivae and EOM are normal. Pupils are equal, round, and reactive to light. Right eye exhibits no discharge. Left eye exhibits no discharge.  Neck: Normal range of motion. Neck supple. No JVD present. No thyromegaly present.  Cardiovascular: Normal rate, regular rhythm, normal heart sounds and intact distal pulses. Exam reveals no gallop and no friction rub.  No murmur heard. Pulmonary/Chest: Effort normal and breath sounds normal.  Abdominal: Soft. Bowel sounds are normal. She exhibits no mass. There is no tenderness. There is no guarding.  Musculoskeletal: Normal range of motion. She exhibits no edema.  Lymphadenopathy:    She has no cervical adenopathy.  Neurological: She is  alert.  Skin: Skin is warm and dry. She is not diaphoretic.  Psychiatric: Mood and affect normal.      Assessment & Plan  Problem List Items Addressed This Visit      Endocrine   Hypothyroidism - Primary   Relevant Medications   levothyroxine (SYNTHROID, LEVOTHROID) 50 MCG tablet      Meds ordered this encounter  Medications  . levothyroxine (SYNTHROID, LEVOTHROID) 50 MCG tablet    Sig: Take 1 tablet (50 mcg total) daily by mouth.    Dispense:  90 tablet    Refill:  3      Dr. Otilio Miu [facility]  03/24/17

## 2017-04-12 ENCOUNTER — Other Ambulatory Visit: Payer: Self-pay | Admitting: Family Medicine

## 2017-04-29 ENCOUNTER — Ambulatory Visit (INDEPENDENT_AMBULATORY_CARE_PROVIDER_SITE_OTHER): Payer: BLUE CROSS/BLUE SHIELD | Admitting: Family Medicine

## 2017-04-29 ENCOUNTER — Encounter: Payer: Self-pay | Admitting: Family Medicine

## 2017-04-29 VITALS — BP 120/80 | HR 76 | Ht 67.0 in | Wt 164.0 lb

## 2017-04-29 DIAGNOSIS — H6992 Unspecified Eustachian tube disorder, left ear: Secondary | ICD-10-CM | POA: Diagnosis not present

## 2017-04-29 MED ORDER — FLUTICASONE PROPIONATE 50 MCG/ACT NA SUSP: 2.0000 | Freq: Every day | NASAL | 6 refills | Status: DC

## 2017-04-29 NOTE — Progress Notes (Signed)
Name: [name]   MRN: [id number]    DOB: 1965-10-11   Date:04/29/2017       Progress Note  Subjective  Chief Complaint  Chief Complaint  Patient presents with  . Otitis Media    L) ear pain- no discharge or drainage/ hurting down into the L) side of neck    Otalgia   There is pain in the left ear. This is a recurrent problem. The current episode started in the past 7 days. The problem occurs hourly. The problem has been waxing and waning. There has been no fever. The pain is mild. Pertinent negatives include no abdominal pain, coughing, diarrhea, ear discharge, headaches, hearing loss, neck pain, rash, rhinorrhea, sore throat or vomiting. She has tried nothing for the symptoms.    No problem-specific Assessment & Plan notes found for this encounter.   Past Medical History:  Diagnosis Date  . Abnormal Pap smear 2002   LGSIL  . Allergy   . GERD (gastroesophageal reflux disease)    when on a medicine for uterine bleeding  . Hemorrhoid   . History of blood transfusion    for uterine bleeding; now resolved  . Hypothyroidism   . Shortness of breath dyspnea    with exercise since starting megace     Past Surgical History:  Procedure Laterality Date  . BREAST SURGERY  1994   left breast bx  . COLONOSCOPY W/ BIOPSIES  2013   divert. cleared for 5 yrs- [facility]  . COLPOSCOPY  2002   ABNORMAL PAP  . CYSTOSCOPY  06/06/2015   Procedure: CYSTOSCOPY;  Surgeon: Osborne Oman, MD;  Location: [facility];  Service: Gynecology;;  . DILATION AND CURETTAGE OF UTERUS     TAB  . LAPAROSCOPIC VAGINAL HYSTERECTOMY WITH SALPINGO OOPHORECTOMY N/A 06/06/2015   Procedure: LAPAROSCOPIC ASSISTED VAGINAL HYSTERECTOMY ;  Surgeon: Osborne Oman, MD;  Location: [facility];  Service: Gynecology;  Laterality: N/A;  722.9grams   . LEEP  2002  . TUBAL LIGATION    . VAGINAL HYSTERECTOMY      Family History  Problem Relation Age of Onset  . Hypertension Father   . Diabetes Father   . Cancer  Maternal Grandmother        female cancer  . Stroke Maternal Grandfather   . Diabetes Paternal Aunt   . Heart disease Paternal Aunt   . Diabetes Paternal Uncle   . Heart disease Paternal Uncle   . Colon cancer Neg Hx   . Esophageal cancer Neg Hx   . Stomach cancer Neg Hx   . Prostate cancer Neg Hx   . Kidney cancer Neg Hx   . Bladder Cancer Neg Hx     Social History   Socioeconomic History  . Marital status: Married    Spouse name: Not on file  . Number of children: 2  . Years of education: Not on file  . Highest education level: Not on file  Social Needs  . Financial resource strain: Not on file  . Food insecurity - worry: Not on file  . Food insecurity - inability: Not on file  . Transportation needs - medical: Not on file  . Transportation needs - non-medical: Not on file  Occupational History  . Occupation: Engineer, mining: [employer]  Tobacco Use  . Smoking status: Never Smoker  . Smokeless tobacco: Never Used  Substance and Sexual Activity  . Alcohol use: No    Alcohol/week:  0.0 oz  . Drug use: No  . Sexual activity: Not Currently    Partners: Male    Birth control/protection: Surgical  Other Topics Concern  . Not on file  Social History Narrative  . Not on file    Allergies  Allergen Reactions  . Doxycycline Other (See Comments)    Thrush  . Ciprofloxacin Other (See Comments)    Yeast infection  . Sulfa Antibiotics Other (See Comments)    Yeast infections    Outpatient Medications Prior to Visit  Medication Sig Dispense Refill  . Acetaminophen (TYLENOL ARTHRITIS PAIN PO) Take 2 tablets by mouth daily as needed (pain).    . Black Cohosh-SoyIsoflav-Magnol (ESTROVEN MENOPAUSE RELIEF PO) Take 1 capsule daily by mouth. otc    . docusate sodium (COLACE) 250 MG capsule Take 250 mg by mouth daily.    Marland Kitchen levothyroxine (SYNTHROID, LEVOTHROID) 50 MCG tablet Take 1 tablet (50 mcg total) daily by mouth. 90 tablet 3  . loratadine  (CLARITIN) 10 MG tablet Take 10 mg by mouth daily as needed for allergies. otc    . Magnesium 250 MG TABS Take by mouth.    . METRONIDAZOLE, TOPICAL, 0.75 % LOTN Apply 1 application topically 2 (two) times daily. (Patient taking differently: Apply 1 application topically daily as needed. ) 59 mL 3  . Multiple Vitamins-Minerals (MULTIVITAMIN WITH MINERALS) tablet Take 1 tablet by mouth daily.    Marland Kitchen OVER THE COUNTER MEDICATION For sleep Camomile and Melatonin and another ingredient pt unsure of    . Probiotic Product (ALIGN) 4 MG CAPS Take 1 capsule by mouth daily. 30 capsule 6   Facility-Administered Medications Prior to Visit  Medication Dose Route Frequency Provider Last Rate Last Dose  . 0.9 %  sodium chloride infusion  500 mL Intravenous Continuous Pyrtle, Lajuan Lines, MD        Review of Systems  Constitutional: Negative for chills, fever, malaise/fatigue and weight loss.  HENT: Positive for ear pain. Negative for ear discharge, hearing loss, rhinorrhea and sore throat.   Eyes: Negative for blurred vision.  Respiratory: Negative for cough, sputum production, shortness of breath and wheezing.   Cardiovascular: Negative for chest pain, palpitations and leg swelling.  Gastrointestinal: Negative for abdominal pain, blood in stool, constipation, diarrhea, heartburn, melena, nausea and vomiting.  Genitourinary: Negative for dysuria, frequency, hematuria and urgency.  Musculoskeletal: Negative for back pain, joint pain, myalgias and neck pain.  Skin: Negative for rash.  Neurological: Negative for dizziness, tingling, sensory change, focal weakness and headaches.  Endo/Heme/Allergies: Negative for environmental allergies and polydipsia. Does not bruise/bleed easily.  Psychiatric/Behavioral: Negative for depression and suicidal ideas. The patient is not nervous/anxious and does not have insomnia.      Objective  Vitals:   04/29/17 1057  BP: 120/80  Pulse: 76  Weight: 164 lb (74.4 kg)  Height:  5\' 7"  (1.702 m)    Physical Exam  Constitutional: She is well-developed, well-nourished, and in no distress. No distress.  HENT:  Head: Normocephalic and atraumatic.  Right Ear: External ear normal. Tympanic membrane is retracted.  Left Ear: External ear normal. Tympanic membrane is retracted.  Nose: Right sinus exhibits no maxillary sinus tenderness and no frontal sinus tenderness. Left sinus exhibits no maxillary sinus tenderness and no frontal sinus tenderness.  Mouth/Throat: Oropharynx is clear and moist. No oropharyngeal exudate, posterior oropharyngeal edema, posterior oropharyngeal erythema or tonsillar abscesses.  Eyes: Conjunctivae and EOM are normal. Pupils are equal, round, and reactive to light. Right eye  exhibits no discharge. Left eye exhibits no discharge.  Neck: Normal range of motion. Neck supple. No JVD present. No thyromegaly present.  Cardiovascular: Normal rate, regular rhythm, normal heart sounds and intact distal pulses. Exam reveals no gallop and no friction rub.  No murmur heard. Pulmonary/Chest: Effort normal and breath sounds normal. She has no wheezes. She has no rales.  Abdominal: Soft. Bowel sounds are normal. She exhibits no mass. There is no tenderness. There is no guarding.  Musculoskeletal: Normal range of motion. She exhibits no edema.  Lymphadenopathy:    She has no cervical adenopathy.  Neurological: She is alert. She has normal reflexes.  Skin: Skin is warm and dry. She is not diaphoretic.  Psychiatric: Mood and affect normal.  Nursing note and vitals reviewed.     Assessment & Plan  Problem List Items Addressed This Visit    None    Visit Diagnoses    Eustachian tube disorder, left    -  Primary   Relevant Medications   fluticasone (FLONASE) 50 MCG/ACT nasal spray      Meds ordered this encounter  Medications  . fluticasone (FLONASE) 50 MCG/ACT nasal spray    Sig: Place 2 sprays into both nostrils daily.    Dispense:  16 g    Refill:   6      Dr. Otilio Miu [facility]  04/29/17

## 2017-05-09 ENCOUNTER — Encounter: Payer: Self-pay | Admitting: Family Medicine

## 2017-05-09 ENCOUNTER — Ambulatory Visit (INDEPENDENT_AMBULATORY_CARE_PROVIDER_SITE_OTHER): Payer: BLUE CROSS/BLUE SHIELD | Admitting: Family Medicine

## 2017-05-09 VITALS — BP 130/80 | HR 60 | Ht 67.0 in | Wt 163.0 lb

## 2017-05-09 DIAGNOSIS — E039 Hypothyroidism, unspecified: Secondary | ICD-10-CM

## 2017-05-09 NOTE — Patient Instructions (Signed)
Hypothyroidism Hypothyroidism is a disorder of the thyroid. The thyroid is a large gland that is located in the lower front of the neck. The thyroid releases hormones that control how the body works. With hypothyroidism, the thyroid does not make enough of these hormones. What are the causes? Causes of hypothyroidism may include:  Viral infections.  Pregnancy.  Your own defense system (immune system) attacking your thyroid.  Certain medicines.  Birth defects.  Past radiation treatments to your head or neck.  Past treatment with radioactive iodine.  Past surgical removal of part or all of your thyroid.  Problems with the gland that is located in the center of your brain (pituitary).  What are the signs or symptoms? Signs and symptoms of hypothyroidism may include:  Feeling as though you have no energy (lethargy).  Inability to tolerate cold.  Weight gain that is not explained by a change in diet or exercise habits.  Dry skin.  Coarse hair.  Menstrual irregularity.  Slowing of thought processes.  Constipation.  Sadness or depression.  How is this diagnosed? Your health care provider may diagnose hypothyroidism with blood tests and ultrasound tests. How is this treated? Hypothyroidism is treated with medicine that replaces the hormones that your body does not make. After you begin treatment, it may take several weeks for symptoms to go away. Follow these instructions at home:  Take medicines only as directed by your health care provider.  If you start taking any new medicines, tell your health care provider.  Keep all follow-up visits as directed by your health care provider. This is important. As your condition improves, your dosage needs may change. You will need to have blood tests regularly so that your health care provider can watch your condition. Contact a health care provider if:  Your symptoms do not get better with treatment.  You are taking thyroid  replacement medicine and: ? You sweat excessively. ? You have tremors. ? You feel anxious. ? You lose weight rapidly. ? You cannot tolerate heat. ? You have emotional swings. ? You have diarrhea. ? You feel weak. Get help right away if:  You develop chest pain.  You develop an irregular heartbeat.  You develop a rapid heartbeat. This information is not intended to replace advice given to you by your health care provider. Make sure you discuss any questions you have with your health care provider. Document Released: 04/29/2005 Document Revised: 10/05/2015 Document Reviewed: 09/14/2013 Elsevier Interactive Patient Education  2018 Elsevier Inc.  

## 2017-05-09 NOTE — Progress Notes (Signed)
Name: [name]   MRN: [id number]    DOB: 25-May-1965   Date:05/09/2017       Progress Note  Subjective  Chief Complaint  Chief Complaint  Patient presents with  . Hypothyroidism    Thyroid Problem  Presents for follow-up visit. Symptoms include cold intolerance, diaphoresis, dry skin and fatigue. Patient reports no anxiety, constipation, depressed mood, diarrhea, hair loss, heat intolerance, hoarse voice, leg swelling, menstrual problem, nail problem, palpitations, tremors, visual change, weight gain or weight loss. (Emotional) The symptoms have been stable.    No problem-specific Assessment & Plan notes found for this encounter.   Past Medical History:  Diagnosis Date  . Abnormal Pap smear 2002   LGSIL  . Allergy   . GERD (gastroesophageal reflux disease)    when on a medicine for uterine bleeding  . Hemorrhoid   . History of blood transfusion    for uterine bleeding; now resolved  . Hypothyroidism   . Shortness of breath dyspnea    with exercise since starting megace     Past Surgical History:  Procedure Laterality Date  . BREAST SURGERY  1994   left breast bx  . COLONOSCOPY W/ BIOPSIES  2013   divert. cleared for 5 yrs- [facility]  . COLPOSCOPY  2002   ABNORMAL PAP  . CYSTOSCOPY  06/06/2015   Procedure: CYSTOSCOPY;  Surgeon: Osborne Oman, MD;  Location: [facility];  Service: Gynecology;;  . DILATION AND CURETTAGE OF UTERUS     TAB  . LAPAROSCOPIC VAGINAL HYSTERECTOMY WITH SALPINGO OOPHORECTOMY N/A 06/06/2015   Procedure: LAPAROSCOPIC ASSISTED VAGINAL HYSTERECTOMY ;  Surgeon: Osborne Oman, MD;  Location: [facility];  Service: Gynecology;  Laterality: N/A;  722.9grams   . LEEP  2002  . TUBAL LIGATION    . VAGINAL HYSTERECTOMY      Family History  Problem Relation Age of Onset  . Hypertension Father   . Diabetes Father   . Cancer Maternal Grandmother        female cancer  . Stroke Maternal Grandfather   . Diabetes Paternal Aunt   . Heart disease Paternal  Aunt   . Diabetes Paternal Uncle   . Heart disease Paternal Uncle   . Colon cancer Neg Hx   . Esophageal cancer Neg Hx   . Stomach cancer Neg Hx   . Prostate cancer Neg Hx   . Kidney cancer Neg Hx   . Bladder Cancer Neg Hx     Social History   Socioeconomic History  . Marital status: Married    Spouse name: Not on file  . Number of children: 2  . Years of education: Not on file  . Highest education level: Not on file  Social Needs  . Financial resource strain: Not on file  . Food insecurity - worry: Not on file  . Food insecurity - inability: Not on file  . Transportation needs - medical: Not on file  . Transportation needs - non-medical: Not on file  Occupational History  . Occupation: Engineer, mining: [employer]  Tobacco Use  . Smoking status: Never Smoker  . Smokeless tobacco: Never Used  Substance and Sexual Activity  . Alcohol use: No    Alcohol/week: 0.0 oz  . Drug use: No  . Sexual activity: Not Currently    Partners: Male    Birth control/protection: Surgical  Other Topics Concern  . Not on file  Social History Narrative  . Not  on file    Allergies  Allergen Reactions  . Doxycycline Other (See Comments)    Thrush  . Ciprofloxacin Other (See Comments)    Yeast infection  . Sulfa Antibiotics Other (See Comments)    Yeast infections    Outpatient Medications Prior to Visit  Medication Sig Dispense Refill  . Acetaminophen (TYLENOL ARTHRITIS PAIN PO) Take 2 tablets by mouth daily as needed (pain).    . Black Cohosh-SoyIsoflav-Magnol (ESTROVEN MENOPAUSE RELIEF PO) Take 1 capsule daily by mouth. otc    . docusate sodium (COLACE) 250 MG capsule Take 250 mg by mouth daily.    . fluticasone (FLONASE) 50 MCG/ACT nasal spray Place 2 sprays into both nostrils daily. 16 g 6  . levothyroxine (SYNTHROID, LEVOTHROID) 50 MCG tablet Take 1 tablet (50 mcg total) daily by mouth. 90 tablet 3  . loratadine (CLARITIN) 10 MG tablet Take 10 mg by  mouth daily as needed for allergies. otc    . Magnesium 250 MG TABS Take by mouth.    . METRONIDAZOLE, TOPICAL, 0.75 % LOTN Apply 1 application topically 2 (two) times daily. (Patient taking differently: Apply 1 application topically daily as needed. ) 59 mL 3  . Multiple Vitamins-Minerals (MULTIVITAMIN WITH MINERALS) tablet Take 1 tablet by mouth daily.    Marland Kitchen OVER THE COUNTER MEDICATION For sleep Camomile and Melatonin and another ingredient pt unsure of    . Probiotic Product (ALIGN) 4 MG CAPS Take 1 capsule by mouth daily. 30 capsule 6   Facility-Administered Medications Prior to Visit  Medication Dose Route Frequency Provider Last Rate Last Dose  . 0.9 %  sodium chloride infusion  500 mL Intravenous Continuous Pyrtle, Lajuan Lines, MD        Review of Systems  Constitutional: Positive for diaphoresis and fatigue. Negative for chills, fever, malaise/fatigue, weight gain and weight loss.  HENT: Negative for ear discharge, ear pain, hoarse voice and sore throat.   Eyes: Negative for blurred vision.  Respiratory: Negative for cough, sputum production, shortness of breath and wheezing.   Cardiovascular: Negative for chest pain, palpitations and leg swelling.  Gastrointestinal: Negative for abdominal pain, blood in stool, constipation, diarrhea, heartburn, melena and nausea.  Genitourinary: Negative for dysuria, frequency, hematuria, menstrual problem and urgency.  Musculoskeletal: Negative for back pain, joint pain, myalgias and neck pain.  Skin: Negative for rash.  Neurological: Negative for dizziness, tingling, tremors, sensory change, focal weakness and headaches.  Endo/Heme/Allergies: Positive for cold intolerance. Negative for environmental allergies, heat intolerance and polydipsia. Does not bruise/bleed easily.  Psychiatric/Behavioral: Negative for depression and suicidal ideas. The patient is not nervous/anxious and does not have insomnia.      Objective  Vitals:   05/09/17 0908  BP:  130/80  Pulse: 60  Weight: 163 lb (73.9 kg)  Height: 5\' 7"  (1.702 m)    Physical Exam  Constitutional: She is oriented to person, place, and time and well-developed, well-nourished, and in no distress.  Neck: Normal range of motion.  Cardiovascular: Normal rate, regular rhythm and normal heart sounds.  Pulmonary/Chest: Effort normal and breath sounds normal.  Musculoskeletal: Normal range of motion.  Neurological: She is alert and oriented to person, place, and time. Gait normal.  Skin: Skin is warm and dry.  Psychiatric: Affect normal.      Assessment & Plan  Problem List Items Addressed This Visit      Endocrine   Hypothyroidism - Primary   Relevant Orders   TSH      No  orders of the defined types were placed in this encounter.     Dr. [facility]  05/09/17

## 2017-05-10 LAB — TSH: TSH: 2.94 u[IU]/mL (ref 0.450–4.500)

## 2017-08-13 ENCOUNTER — Encounter: Payer: Self-pay | Admitting: Family Medicine

## 2017-08-13 ENCOUNTER — Ambulatory Visit (INDEPENDENT_AMBULATORY_CARE_PROVIDER_SITE_OTHER): Payer: BLUE CROSS/BLUE SHIELD | Admitting: Family Medicine

## 2017-08-13 VITALS — BP 100/64 | HR 64 | Ht 67.0 in | Wt 165.0 lb

## 2017-08-13 DIAGNOSIS — J0100 Acute maxillary sinusitis, unspecified: Secondary | ICD-10-CM | POA: Diagnosis not present

## 2017-08-13 DIAGNOSIS — L230 Allergic contact dermatitis due to metals: Secondary | ICD-10-CM

## 2017-08-13 MED ORDER — AZITHROMYCIN 250 MG PO TABS: ORAL_TABLET | ORAL | 0 refills | Status: DC

## 2017-08-13 MED ORDER — TRIAMCINOLONE ACETONIDE 0.1 % EX CREA: 1.0000 "application " | TOPICAL_CREAM | Freq: Two times a day (BID) | CUTANEOUS | 0 refills | Status: DC

## 2017-08-13 NOTE — Progress Notes (Signed)
Name: [name]   MRN: [id number]    DOB: June 28, 1965   Date:08/13/2017       Progress Note  Subjective  Chief Complaint  Chief Complaint  Patient presents with  . Sinusitis    starts with a tickle in the throat, head cong, cough- dry  . Rash    on L) wrist- came up after wearing fitbit    Sinusitis  This is a new problem. The current episode started in the past 7 days. The problem has been gradually worsening since onset. There has been no fever. Her pain is at a severity of 3/10. The pain is moderate. Associated symptoms include chills, congestion, coughing, diaphoresis, ear pain, headaches, a hoarse voice, neck pain, sinus pressure, sneezing, a sore throat and swollen glands. Pertinent negatives include no shortness of breath. Past treatments include oral decongestants and acetaminophen. The treatment provided no relief.  Rash  This is a new problem. The current episode started in the past 7 days. The problem has been gradually worsening since onset. The affected locations include the left wrist. The rash is characterized by itchiness and redness. She was exposed to nothing. Associated symptoms include congestion, coughing and a sore throat. Pertinent negatives include no diarrhea, fever, joint pain or shortness of breath.    No problem-specific Assessment & Plan notes found for this encounter.   Past Medical History:  Diagnosis Date  . Abnormal Pap smear 2002   LGSIL  . Allergy   . GERD (gastroesophageal reflux disease)    when on a medicine for uterine bleeding  . Hemorrhoid   . History of blood transfusion    for uterine bleeding; now resolved  . Hypothyroidism   . Shortness of breath dyspnea    with exercise since starting megace     Past Surgical History:  Procedure Laterality Date  . BREAST SURGERY  1994   left breast bx  . COLONOSCOPY W/ BIOPSIES  2013   divert. cleared for 5 yrs- [facility]  . COLPOSCOPY  2002   ABNORMAL PAP  . CYSTOSCOPY  06/06/2015   Procedure: CYSTOSCOPY;  Surgeon: Osborne Oman, MD;  Location: [facility];  Service: Gynecology;;  . DILATION AND CURETTAGE OF UTERUS     TAB  . LAPAROSCOPIC VAGINAL HYSTERECTOMY WITH SALPINGO OOPHORECTOMY N/A 06/06/2015   Procedure: LAPAROSCOPIC ASSISTED VAGINAL HYSTERECTOMY ;  Surgeon: Osborne Oman, MD;  Location: [facility];  Service: Gynecology;  Laterality: N/A;  722.9grams   . LEEP  2002  . TUBAL LIGATION    . VAGINAL HYSTERECTOMY      Family History  Problem Relation Age of Onset  . Hypertension Father   . Diabetes Father   . Cancer Maternal Grandmother        female cancer  . Stroke Maternal Grandfather   . Diabetes Paternal Aunt   . Heart disease Paternal Aunt   . Diabetes Paternal Uncle   . Heart disease Paternal Uncle   . Colon cancer Neg Hx   . Esophageal cancer Neg Hx   . Stomach cancer Neg Hx   . Prostate cancer Neg Hx   . Kidney cancer Neg Hx   . Bladder Cancer Neg Hx     Social History   Socioeconomic History  . Marital status: Married    Spouse name: Not on file  . Number of children: 2  . Years of education: Not on file  . Highest education level: Not on file  Occupational History  . Occupation:  Engineer, mining: [employer]  Social Needs  . Financial resource strain: Not on file  . Food insecurity:    Worry: Not on file    Inability: Not on file  . Transportation needs:    Medical: Not on file    Non-medical: Not on file  Tobacco Use  . Smoking status: Never Smoker  . Smokeless tobacco: Never Used  Substance and Sexual Activity  . Alcohol use: No    Alcohol/week: 0.0 oz  . Drug use: No  . Sexual activity: Not Currently    Partners: Male    Birth control/protection: Surgical  Lifestyle  . Physical activity:    Days per week: Not on file    Minutes per session: Not on file  . Stress: Not on file  Relationships  . Social connections:    Talks on phone: Not on file    Gets together: Not on file    Attends  religious service: Not on file    Active member of club or organization: Not on file    Attends meetings of clubs or organizations: Not on file    Relationship status: Not on file  . Intimate partner violence:    Fear of current or ex partner: Not on file    Emotionally abused: Not on file    Physically abused: Not on file    Forced sexual activity: Not on file  Other Topics Concern  . Not on file  Social History Narrative  . Not on file    Allergies  Allergen Reactions  . Doxycycline Other (See Comments)    Thrush  . Ciprofloxacin Other (See Comments)    Yeast infection  . Sulfa Antibiotics Other (See Comments)    Yeast infections    Outpatient Medications Prior to Visit  Medication Sig Dispense Refill  . Acetaminophen (TYLENOL ARTHRITIS PAIN PO) Take 2 tablets by mouth daily as needed (pain).    . Black Cohosh-SoyIsoflav-Magnol (ESTROVEN MENOPAUSE RELIEF PO) Take 1 capsule daily by mouth. otc    . docusate sodium (COLACE) 250 MG capsule Take 250 mg by mouth daily.    . fluticasone (FLONASE) 50 MCG/ACT nasal spray Place 2 sprays into both nostrils daily. 16 g 6  . levothyroxine (SYNTHROID, LEVOTHROID) 50 MCG tablet Take 1 tablet (50 mcg total) daily by mouth. 90 tablet 3  . loratadine (CLARITIN) 10 MG tablet Take 10 mg by mouth daily as needed for allergies. otc    . Magnesium 250 MG TABS Take by mouth.    . METRONIDAZOLE, TOPICAL, 0.75 % LOTN Apply 1 application topically 2 (two) times daily. (Patient taking differently: Apply 1 application topically daily as needed. ) 59 mL 3  . Multiple Vitamins-Minerals (MULTIVITAMIN WITH MINERALS) tablet Take 1 tablet by mouth daily.    Marland Kitchen OVER THE COUNTER MEDICATION For sleep Camomile and Melatonin and another ingredient pt unsure of    . Probiotic Product (ALIGN) 4 MG CAPS Take 1 capsule by mouth daily. 30 capsule 6   Facility-Administered Medications Prior to Visit  Medication Dose Route Frequency Provider Last Rate Last Dose  . 0.9 %   sodium chloride infusion  500 mL Intravenous Continuous Pyrtle, Lajuan Lines, MD        Review of Systems  Constitutional: Positive for chills and diaphoresis. Negative for fever, malaise/fatigue and weight loss.  HENT: Positive for congestion, ear pain, hoarse voice, sinus pressure, sneezing and sore throat. Negative for ear discharge.   Eyes:  Negative for blurred vision.  Respiratory: Positive for cough. Negative for sputum production, shortness of breath and wheezing.   Cardiovascular: Negative for chest pain, palpitations and leg swelling.  Gastrointestinal: Negative for abdominal pain, blood in stool, constipation, diarrhea, heartburn, melena and nausea.  Genitourinary: Negative for dysuria, frequency, hematuria and urgency.  Musculoskeletal: Positive for neck pain. Negative for back pain, joint pain and myalgias.  Skin: Positive for rash.  Neurological: Positive for headaches. Negative for dizziness, tingling, sensory change and focal weakness.  Endo/Heme/Allergies: Negative for environmental allergies and polydipsia. Does not bruise/bleed easily.  Psychiatric/Behavioral: Negative for depression and suicidal ideas. The patient is not nervous/anxious and does not have insomnia.      Objective  Vitals:   08/13/17 1112  BP: 100/64  Pulse: 64  Weight: 165 lb (74.8 kg)  Height: 5\' 7"  (1.702 m)    Physical Exam  Constitutional: She is well-developed, well-nourished, and in no distress. No distress.  HENT:  Head: Normocephalic and atraumatic.  Right Ear: External ear normal.  Left Ear: External ear normal.  Nose: Nose normal.  Mouth/Throat: Oropharynx is clear and moist.  Eyes: Pupils are equal, round, and reactive to light. Conjunctivae and EOM are normal. Right eye exhibits no discharge. Left eye exhibits no discharge.  Neck: Normal range of motion. Neck supple. No JVD present. No thyromegaly present.  Cardiovascular: Normal rate, regular rhythm, normal heart sounds and intact  distal pulses. Exam reveals no gallop and no friction rub.  No murmur heard. Pulmonary/Chest: Effort normal and breath sounds normal. She has no wheezes. She has no rales.  Abdominal: Soft. Bowel sounds are normal. She exhibits no mass. There is no tenderness. There is no guarding.  Musculoskeletal: Normal range of motion. She exhibits no edema.  Lymphadenopathy:    She has no cervical adenopathy.  Neurological: She is alert. She has normal reflexes.  Skin: Skin is warm and dry. She is not diaphoretic.  Psychiatric: Mood and affect normal.  Nursing note and vitals reviewed.     Assessment & Plan  Problem List Items Addressed This Visit    None    Visit Diagnoses    Acute non-recurrent maxillary sinusitis    -  Primary   Relevant Medications   azithromycin (ZITHROMAX) 250 MG tablet   Nickel allergy, current reaction       Relevant Medications   triamcinolone cream (KENALOG) 0.1 %      Meds ordered this encounter  Medications  . triamcinolone cream (KENALOG) 0.1 %    Sig: Apply 1 application topically 2 (two) times daily.    Dispense:  30 g    Refill:  0  . azithromycin (ZITHROMAX) 250 MG tablet    Sig: 2 today then 1 a day for 4 days    Dispense:  6 tablet    Refill:  0      Dr. Mac[facility]  08/13/17

## 2017-09-01 ENCOUNTER — Ambulatory Visit (INDEPENDENT_AMBULATORY_CARE_PROVIDER_SITE_OTHER): Payer: BLUE CROSS/BLUE SHIELD | Admitting: Family Medicine

## 2017-09-01 ENCOUNTER — Encounter: Payer: Self-pay | Admitting: Family Medicine

## 2017-09-01 VITALS — BP 124/80 | HR 98 | Ht 66.0 in | Wt 170.0 lb

## 2017-09-01 DIAGNOSIS — M5010 Cervical disc disorder with radiculopathy, unspecified cervical region: Secondary | ICD-10-CM

## 2017-09-01 MED ORDER — TRAMADOL HCL 50 MG PO TABS: 50.0000 mg | ORAL_TABLET | Freq: Three times a day (TID) | ORAL | 0 refills | Status: DC | PRN

## 2017-09-01 MED ORDER — MELOXICAM 15 MG PO TABS: 15.0000 mg | ORAL_TABLET | Freq: Every day | ORAL | 0 refills | Status: DC

## 2017-09-01 MED ORDER — CYCLOBENZAPRINE HCL 10 MG PO TABS: 10.0000 mg | ORAL_TABLET | Freq: Three times a day (TID) | ORAL | 0 refills | Status: DC | PRN

## 2017-09-01 MED ORDER — PREDNISONE 10 MG PO TABS: ORAL_TABLET | ORAL | 1 refills | Status: DC

## 2017-09-01 NOTE — Patient Instructions (Signed)
Herniated Disk A herniated disk, also called a ruptured disk or slipped disk, occurs when a disk in the spine bulges out too far. Between the bones in the spine (vertebrae), there are oval disks that are made of a soft, spongy center that is surrounded by a tough outer ring. The disks connect your vertebrae, help your spine move, and absorb shocks from your movement. When you have a herniated disk, the spongy center of the disk bulges out or breaks through the outer ring. It can press on a nerve between the vertebrae and cause pain. This can occur anywhere in the back or neck area, but the lower back is most commonly affected. What are the causes? This condition may be caused by:  Age-related wear and tear. The spongy centers of spinal disks tend to shrink and dry out with age, which makes them more likely to herniate.  Sudden injury, such as a strain or sprain.  What increases the risk? Aging is the main risk factor for a herniated disk. Other risk factors include:  Being a man who is 30-50 years old.  Frequently doing activities that involve heavy lifting, bending, or twisting.  Frequently driving for long hours at a time.  Not getting enough exercise.  Being overweight.  Smoking.  Having a family history of back problems or herniated disks.  Being pregnant or giving birth.  Having poor nutrition.  Being tall.  What are the signs or symptoms? Symptoms may vary depending on where your herniated disk is located.  A herniated disk in the lower back may cause sharp pain in: ? Part of the arm, leg, hip, or buttocks. ? The back of the lower leg (calf). ? The lower back, spreading down through the leg into the foot (sciatica).  A herniated disk in the neck may cause dizziness and vertigo. It may also cause pain or weakness in: ? The neck. ? The shoulder blades. ? Upper arm, forearm, or fingers.  You may also have muscle weakness. It may be difficult to: ? Lift your leg or  arm. ? Stand on your toes. ? Squeeze tightly with one of your hands.  Other symptoms may include: ? Numbness or tingling in the affected areas of the hands, arms, feet, or legs. ? Inability to control when you urinate or when you have bowel movements. This is a rare but serious sign of a severe herniated disk in the lower back.  How is this diagnosed? This condition may be diagnosed based on:  Your symptoms.  Your medical history.  A physical exam. The exam may include: ? Straight-leg test. You will lie on your back while your health care provider lifts your leg, keeping your knee straight. If you feel pain, you likely have a herniated disk. ? Neurological tests. This includes checking for numbness, reflexes, muscle strength, and posture.  Imaging tests, such as: ? X-rays. ? MRI. ? CT scan. ? Electromyogram (EMG) to check the nerves that control muscles. This test may be used to determine which nerves are affected by your herniated disk.  How is this treated? Treatment for this condition may include:  A short period of rest. This is usually the first treatment. ? You may be on bed rest for up to 2 days, or you may be instructed to stay home and avoid physical activity. ? If you have a herniated disk in your lower back, avoid sitting as much as possible. Sitting increases pressure on the disk.  Medicines. These may   include: ? NSAIDs to help reduce pain and swelling. ? Muscle relaxants to prevent sudden tightening of the back muscles (back spasms). ? Prescription pain medicines, if you have severe pain.  Steroid injections in the area of the herniated disk. This can help reduce pain and swelling.  Physical therapy to strengthen your back muscles.  In many cases, symptoms go away with treatment over a period of days or weeks. You will most likely be free of symptoms after 3-4 months. If other treatments do not help to relieve your symptoms, you may need surgery. Follow these  instructions at home: Medicines  Take over-the-counter and prescription medicines only as told by your health care provider.  Do not drive or use heavy machinery while taking prescription pain medicine. Activity  Rest as directed.  After your rest period: ? Return to your normal activities and gradually begin exercising as told by your health care provider. Ask your health care provider what activities and exercises are safe for you. ? Use good posture. ? Avoid movements that cause pain. ? Do not lift anything that is heavier than 10 lb (4.5 kg) until your health care provider says this is safe. ? Do not sit or stand for long periods of time without changing positions. ? Do not sit for long periods of time without getting up and moving around.  If physical therapy was prescribed, do exercises as instructed.  Aim to strengthen muscles in your back and abdomen with exercises like crunches, swimming, or walking. General instructions  Do not use any products that contain nicotine or tobacco, such as cigarettes and e-cigarettes. These products can delay healing. If you need help quitting, ask your health care provider.  Do not wear high-heeled shoes.  Do not sleep on your belly.  If you are overweight, work with your health care provider to lose weight safely.  To prevent or treat constipation while you are taking prescription pain medicine, your health care provider may recommend that you: ? Drink enough fluid to keep your urine clear or pale yellow. ? Take over-the-counter or prescription medicines. ? Eat foods that are high in fiber, such as fresh fruits and vegetables, whole grains, and beans. ? Limit foods that are high in fat and processed sugars, such as fried and sweet foods.  Keep all follow-up visits as told by your health care provider. This is important. How is this prevented?  Maintain a healthy weight.  Try to avoid stressful situations.  Maintain physical  fitness. Do at least 150 minutes of moderate-intensity exercise each week, such as brisk walking or water aerobics.  When lifting objects: ? Keep your feet at least shoulder-width apart and tighten your abdominal muscles. ? Keep your spine neutral as you bend your knees and hips. It is important to lift using the strength of your legs, not your back. Do not lock your knees straight out. ? Always ask for help to lift heavy or awkward objects. Contact a health care provider if:  You have back pain or neck pain that does not get better after 6 weeks.  You have severe pain in your back, neck, legs, or arms.  You develop numbness, tingling, or weakness in any part of your body. Get help right away if:  You cannot move your arms or legs.  You cannot control when you urinate or have bowel movements.  You feel dizzy or you faint.  You have shortness of breath. This information is not intended to replace advice given   to you by your health care provider. Make sure you discuss any questions you have with your health care provider. Document Released: 04/26/2000 Document Revised: 12/25/2015 Document Reviewed: 12/25/2015 [employer] Interactive Patient Education  2017 [employer]  

## 2017-09-01 NOTE — Progress Notes (Signed)
Name: [name]   MRN: [id number]    DOB: Jul 17, 1965   Date:09/01/2017       Progress Note  Subjective  Chief Complaint  Chief Complaint  Patient presents with  . Neck Pain    started hurting after yoga in the back of neck. Pain radiates down L) shoulder     Neck Pain   This is a new problem. The current episode started in the past 7 days (ache on wednesday). The problem occurs constantly. The problem has been waxing and waning. Associated with: yoga. The pain is present in the left side. The quality of the pain is described as aching. The pain is at a severity of 6/10. The pain is severe. The pain is worse during the day. Associated symptoms include numbness, paresis, tingling and weakness. Pertinent negatives include no chest pain, fever, headaches or weight loss. She has tried NSAIDs for the symptoms. The treatment provided moderate relief.    No problem-specific Assessment & Plan notes found for this encounter.   Past Medical History:  Diagnosis Date  . Abnormal Pap smear 2002   LGSIL  . Allergy   . GERD (gastroesophageal reflux disease)    when on a medicine for uterine bleeding  . Hemorrhoid   . History of blood transfusion    for uterine bleeding; now resolved  . Hypothyroidism   . Shortness of breath dyspnea    with exercise since starting megace     Past Surgical History:  Procedure Laterality Date  . BREAST SURGERY  1994   left breast bx  . COLONOSCOPY W/ BIOPSIES  2013   divert. cleared for 5 yrs- [facility]  . COLPOSCOPY  2002   ABNORMAL PAP  . CYSTOSCOPY  06/06/2015   Procedure: CYSTOSCOPY;  Surgeon: Osborne Oman, MD;  Location: [facility];  Service: Gynecology;;  . DILATION AND CURETTAGE OF UTERUS     TAB  . LAPAROSCOPIC VAGINAL HYSTERECTOMY WITH SALPINGO OOPHORECTOMY N/A 06/06/2015   Procedure: LAPAROSCOPIC ASSISTED VAGINAL HYSTERECTOMY ;  Surgeon: Osborne Oman, MD;  Location: [facility];  Service: Gynecology;  Laterality: N/A;  722.9grams   . LEEP   2002  . TUBAL LIGATION    . VAGINAL HYSTERECTOMY      Family History  Problem Relation Age of Onset  . Hypertension Father   . Diabetes Father   . Cancer Maternal Grandmother        female cancer  . Stroke Maternal Grandfather   . Diabetes Paternal Aunt   . Heart disease Paternal Aunt   . Diabetes Paternal Uncle   . Heart disease Paternal Uncle   . Colon cancer Neg Hx   . Esophageal cancer Neg Hx   . Stomach cancer Neg Hx   . Prostate cancer Neg Hx   . Kidney cancer Neg Hx   . Bladder Cancer Neg Hx     Social History   Socioeconomic History  . Marital status: Married    Spouse name: Not on file  . Number of children: 2  . Years of education: Not on file  . Highest education level: Not on file  Occupational History  . Occupation: Engineer, mining: [employer]  . Financial resource strain: Not on file  . Food insecurity:    Worry: Not on file    Inability: Not on file  . Transportation needs:    Medical: Not on file    Non-medical: Not on  file  Tobacco Use  . Smoking status: Never Smoker  . Smokeless tobacco: Never Used  Substance and Sexual Activity  . Alcohol use: No    Alcohol/week: 0.0 oz  . Drug use: No  . Sexual activity: Not Currently    Partners: Male    Birth control/protection: Surgical  Lifestyle  . Physical activity:    Days per week: Not on file    Minutes per session: Not on file  . Stress: Not on file  Relationships  . Social connections:    Talks on phone: Not on file    Gets together: Not on file    Attends religious service: Not on file    Active member of club or organization: Not on file    Attends meetings of clubs or organizations: Not on file    Relationship status: Not on file  . Intimate partner violence:    Fear of current or ex partner: Not on file    Emotionally abused: Not on file    Physically abused: Not on file    Forced sexual activity: Not on file  Other Topics Concern  . Not  on file  Social History Narrative  . Not on file    Allergies  Allergen Reactions  . Doxycycline Other (See Comments)    Thrush  . Ciprofloxacin Other (See Comments)    Yeast infection  . Sulfa Antibiotics Other (See Comments)    Yeast infections    Outpatient Medications Prior to Visit  Medication Sig Dispense Refill  . Black Cohosh-SoyIsoflav-Magnol (ESTROVEN MENOPAUSE RELIEF PO) Take 1 capsule daily by mouth. otc    . docusate sodium (COLACE) 250 MG capsule Take 250 mg by mouth daily.    . fluticasone (FLONASE) 50 MCG/ACT nasal spray Place 2 sprays into both nostrils daily. 16 g 6  . levothyroxine (SYNTHROID, LEVOTHROID) 50 MCG tablet Take 1 tablet (50 mcg total) daily by mouth. 90 tablet 3  . loratadine (CLARITIN) 10 MG tablet Take 10 mg by mouth daily as needed for allergies. otc    . Magnesium 250 MG TABS Take by mouth.    . METRONIDAZOLE, TOPICAL, 0.75 % LOTN Apply 1 application topically 2 (two) times daily. (Patient taking differently: Apply 1 application topically daily as needed. ) 59 mL 3  . Multiple Vitamins-Minerals (MULTIVITAMIN WITH MINERALS) tablet Take 1 tablet by mouth daily.    Marland Kitchen OVER THE COUNTER MEDICATION For sleep Camomile and Melatonin and another ingredient pt unsure of    . Probiotic Product (ALIGN) 4 MG CAPS Take 1 capsule by mouth daily. 30 capsule 6  . triamcinolone cream (KENALOG) 0.1 % Apply 1 application topically 2 (two) times daily. 30 g 0  . Acetaminophen (TYLENOL ARTHRITIS PAIN PO) Take 2 tablets by mouth daily as needed (pain).    Marland Kitchen azithromycin (ZITHROMAX) 250 MG tablet 2 today then 1 a day for 4 days 6 tablet 0   Facility-Administered Medications Prior to Visit  Medication Dose Route Frequency Provider Last Rate Last Dose  . 0.9 %  sodium chloride infusion  500 mL Intravenous Continuous Pyrtle, Lajuan Lines, MD        Review of Systems  Constitutional: Negative for chills, fever, malaise/fatigue and weight loss.  HENT: Negative for ear discharge,  ear pain and sore throat.   Eyes: Negative for blurred vision.  Respiratory: Negative for cough, sputum production, shortness of breath and wheezing.   Cardiovascular: Negative for chest pain, palpitations and leg swelling.  Gastrointestinal: Negative for  abdominal pain, blood in stool, constipation, diarrhea, heartburn, melena and nausea.  Genitourinary: Negative for dysuria, frequency, hematuria and urgency.  Musculoskeletal: Positive for neck pain. Negative for back pain, falls, joint pain and myalgias.  Skin: Negative for rash.  Neurological: Positive for tingling, weakness and numbness. Negative for dizziness, sensory change, focal weakness, loss of consciousness and headaches.  Endo/Heme/Allergies: Negative for environmental allergies and polydipsia. Does not bruise/bleed easily.  Psychiatric/Behavioral: Negative for depression and suicidal ideas. The patient is not nervous/anxious and does not have insomnia.      Objective  Vitals:   09/01/17 1513  BP: 124/80  Pulse: 98  Weight: 170 lb (77.1 kg)  Height: 5\' 6"  (1.676 m)    Physical Exam  Constitutional: She appears well-developed. No distress.  HENT:  Head: Normocephalic and atraumatic.  Right Ear: External ear normal.  Left Ear: External ear normal.  Nose: Nose normal.  Mouth/Throat: Oropharynx is clear and moist.  Eyes: Pupils are equal, round, and reactive to light. Conjunctivae and EOM are normal. Right eye exhibits no discharge. Left eye exhibits no discharge.  Neck: Normal range of motion. Neck supple. No JVD present. No thyromegaly present.  Cardiovascular: Normal rate, regular rhythm, normal heart sounds and intact distal pulses. Exam reveals no gallop and no friction rub.  No murmur heard. Pulmonary/Chest: Effort normal and breath sounds normal.  Abdominal: Soft. Bowel sounds are normal. She exhibits no mass. There is no tenderness. There is no guarding.  Musculoskeletal: Normal range of motion. She exhibits no  edema.  Lymphadenopathy:    She has no cervical adenopathy.  Neurological: She is alert. She displays abnormal reflex. No sensory deficit.  Reflex Scores:      Tricep reflexes are 2+ on the right side and 1+ on the left side.      Bicep reflexes are 2+ on the right side and 1+ on the left side.      Brachioradialis reflexes are 2+ on the right side and 1+ on the left side. Subtle decreased strength left hand  Skin: Skin is warm and dry. She is not diaphoretic.      Assessment & Plan  Problem List Items Addressed This Visit    None    Visit Diagnoses    Cervical disc disorder with radiculopathy    -  Primary   Relevant Medications   predniSONE (DELTASONE) 10 MG tablet   meloxicam (MOBIC) 15 MG tablet   traMADol (ULTRAM) 50 MG tablet   cyclobenzaprine (FLEXERIL) 10 MG tablet      Meds ordered this encounter  Medications  . predniSONE (DELTASONE) 10 MG tablet    Sig: Taper 6,6,6,5,5,5,4,4,3,3,2,2,1,1    Dispense:  53 tablet    Refill:  1  . meloxicam (MOBIC) 15 MG tablet    Sig: Take 1 tablet (15 mg total) by mouth daily.    Dispense:  30 tablet    Refill:  0  . traMADol (ULTRAM) 50 MG tablet    Sig: Take 1 tablet (50 mg total) by mouth every 8 (eight) hours as needed.    Dispense:  30 tablet    Refill:  0  . cyclobenzaprine (FLEXERIL) 10 MG tablet    Sig: Take 1 tablet (10 mg total) by mouth 3 (three) times daily as needed for muscle spasms.    Dispense:  30 tablet    Refill:  0      Dr. Sammie Denner [facility]  09/01/17

## 2017-09-02 ENCOUNTER — Encounter: Payer: Self-pay | Admitting: Family Medicine

## 2017-09-02 ENCOUNTER — Telehealth: Payer: Self-pay

## 2017-09-02 ENCOUNTER — Other Ambulatory Visit: Payer: Self-pay

## 2017-09-02 DIAGNOSIS — M25512 Pain in left shoulder: Secondary | ICD-10-CM

## 2017-09-02 MED ORDER — HYDROCODONE-ACETAMINOPHEN 5-325 MG PO TABS: 1.0000 | ORAL_TABLET | Freq: Four times a day (QID) | ORAL | 0 refills | Status: AC | PRN

## 2017-09-02 NOTE — Telephone Encounter (Signed)
Pt called in c/o shoulder pain not getting better with Tramadol. We put in a referral for ortho and printed Hydrocodone and left in black box

## 2017-09-05 DIAGNOSIS — M5412 Radiculopathy, cervical region: Secondary | ICD-10-CM | POA: Diagnosis not present

## 2017-09-05 DIAGNOSIS — M25512 Pain in left shoulder: Secondary | ICD-10-CM | POA: Diagnosis not present

## 2017-09-11 ENCOUNTER — Ambulatory Visit: Payer: BLUE CROSS/BLUE SHIELD | Admitting: Family Medicine

## 2017-09-16 ENCOUNTER — Encounter: Payer: Self-pay | Admitting: Family Medicine

## 2017-09-16 ENCOUNTER — Ambulatory Visit (INDEPENDENT_AMBULATORY_CARE_PROVIDER_SITE_OTHER): Payer: BLUE CROSS/BLUE SHIELD | Admitting: Family Medicine

## 2017-09-16 DIAGNOSIS — M5010 Cervical disc disorder with radiculopathy, unspecified cervical region: Secondary | ICD-10-CM

## 2017-09-16 MED ORDER — PREDNISONE 10 MG PO TABS: 10.0000 mg | ORAL_TABLET | Freq: Every day | ORAL | 0 refills | Status: DC

## 2017-09-16 MED ORDER — MELOXICAM 15 MG PO TABS: 15.0000 mg | ORAL_TABLET | Freq: Every day | ORAL | 0 refills | Status: DC

## 2017-09-16 NOTE — Progress Notes (Signed)
Name: [name]   MRN: [id number]    DOB: 1966-04-10   Date:09/16/2017       Progress Note  Subjective  Chief Complaint  Chief Complaint  Patient presents with  . Follow-up    better off and on- still hurting her, but not all the time    Neck Pain   This is a new problem. The current episode started 1 to 4 weeks ago (3 weeks). The problem occurs daily. The problem has been waxing and waning. Associated with: trauma neck. The quality of the pain is described as aching. The pain is at a severity of 5/10. The pain is moderate. Pertinent negatives include no chest pain, fever, headaches, leg pain, numbness, pain with swallowing, paresis, syncope, tingling, trouble swallowing, visual change, weakness or weight loss.    No problem-specific Assessment & Plan notes found for this encounter.   Past Medical History:  Diagnosis Date  . Abnormal Pap smear 2002   LGSIL  . Allergy   . GERD (gastroesophageal reflux disease)    when on a medicine for uterine bleeding  . Hemorrhoid   . History of blood transfusion    for uterine bleeding; now resolved  . Hypothyroidism   . Shortness of breath dyspnea    with exercise since starting megace     Past Surgical History:  Procedure Laterality Date  . BREAST SURGERY  1994   left breast bx  . COLONOSCOPY W/ BIOPSIES  2013   divert. cleared for 5 yrs- [facility]  . COLPOSCOPY  2002   ABNORMAL PAP  . CYSTOSCOPY  06/06/2015   Procedure: CYSTOSCOPY;  Surgeon: Osborne Oman, MD;  Location: [facility];  Service: Gynecology;;  . DILATION AND CURETTAGE OF UTERUS     TAB  . LAPAROSCOPIC VAGINAL HYSTERECTOMY WITH SALPINGO OOPHORECTOMY N/A 06/06/2015   Procedure: LAPAROSCOPIC ASSISTED VAGINAL HYSTERECTOMY ;  Surgeon: Osborne Oman, MD;  Location: [facility];  Service: Gynecology;  Laterality: N/A;  722.9grams   . LEEP  2002  . TUBAL LIGATION    . VAGINAL HYSTERECTOMY      Family History  Problem Relation Age of Onset  . Hypertension Father   .  Diabetes Father   . Cancer Maternal Grandmother        female cancer  . Stroke Maternal Grandfather   . Diabetes Paternal Aunt   . Heart disease Paternal Aunt   . Diabetes Paternal Uncle   . Heart disease Paternal Uncle   . Colon cancer Neg Hx   . Esophageal cancer Neg Hx   . Stomach cancer Neg Hx   . Prostate cancer Neg Hx   . Kidney cancer Neg Hx   . Bladder Cancer Neg Hx     Social History   Socioeconomic History  . Marital status: Married    Spouse name: Not on file  . Number of children: 2  . Years of education: Not on file  . Highest education level: Not on file  Occupational History  . Occupation: Engineer, mining: [employer]  . Financial resource strain: Not on file  . Food insecurity:    Worry: Not on file    Inability: Not on file  . Transportation needs:    Medical: Not on file    Non-medical: Not on file  Tobacco Use  . Smoking status: Never Smoker  . Smokeless tobacco: Never Used  Substance and Sexual Activity  . Alcohol use:  No    Alcohol/week: 0.0 oz  . Drug use: No  . Sexual activity: Not Currently    Partners: Male    Birth control/protection: Surgical  Lifestyle  . Physical activity:    Days per week: Not on file    Minutes per session: Not on file  . Stress: Not on file  Relationships  . Social connections:    Talks on phone: Not on file    Gets together: Not on file    Attends religious service: Not on file    Active member of club or organization: Not on file    Attends meetings of clubs or organizations: Not on file    Relationship status: Not on file  . Intimate partner violence:    Fear of current or ex partner: Not on file    Emotionally abused: Not on file    Physically abused: Not on file    Forced sexual activity: Not on file  Other Topics Concern  . Not on file  Social History Narrative  . Not on file    Allergies  Allergen Reactions  . Doxycycline Other (See Comments)    Thrush   . Ciprofloxacin Other (See Comments)    Yeast infection  . Sulfa Antibiotics Other (See Comments)    Yeast infections    Outpatient Medications Prior to Visit  Medication Sig Dispense Refill  . Black Cohosh-SoyIsoflav-Magnol (ESTROVEN MENOPAUSE RELIEF PO) Take 1 capsule daily by mouth. otc    . cyclobenzaprine (FLEXERIL) 10 MG tablet Take 1 tablet (10 mg total) by mouth 3 (three) times daily as needed for muscle spasms. 30 tablet 0  . docusate sodium (COLACE) 250 MG capsule Take 250 mg by mouth daily.    . fluticasone (FLONASE) 50 MCG/ACT nasal spray Place 2 sprays into both nostrils daily. 16 g 6  . levothyroxine (SYNTHROID, LEVOTHROID) 50 MCG tablet Take 1 tablet (50 mcg total) daily by mouth. 90 tablet 3  . loratadine (CLARITIN) 10 MG tablet Take 10 mg by mouth daily as needed for allergies. otc    . Magnesium 250 MG TABS Take by mouth.    . METRONIDAZOLE, TOPICAL, 0.75 % LOTN Apply 1 application topically 2 (two) times daily. (Patient taking differently: Apply 1 application topically daily as needed. ) 59 mL 3  . Multiple Vitamins-Minerals (MULTIVITAMIN WITH MINERALS) tablet Take 1 tablet by mouth daily.    Marland Kitchen OVER THE COUNTER MEDICATION For sleep Camomile and Melatonin and another ingredient pt unsure of    . Probiotic Product (ALIGN) 4 MG CAPS Take 1 capsule by mouth daily. 30 capsule 6  . triamcinolone cream (KENALOG) 0.1 % Apply 1 application topically 2 (two) times daily. 30 g 0  . meloxicam (MOBIC) 15 MG tablet Take 1 tablet (15 mg total) by mouth daily. 30 tablet 0  . Acetaminophen (TYLENOL ARTHRITIS PAIN PO) Take 2 tablets by mouth daily as needed (pain).    . predniSONE (DELTASONE) 10 MG tablet Taper 6,6,6,5,5,5,4,4,3,3,2,2,1,1 53 tablet 1  . traMADol (ULTRAM) 50 MG tablet Take 1 tablet (50 mg total) by mouth every 8 (eight) hours as needed. 30 tablet 0   Facility-Administered Medications Prior to Visit  Medication Dose Route Frequency Provider Last Rate Last Dose  . 0.9 %   sodium chloride infusion  500 mL Intravenous Continuous Pyrtle, Lajuan Lines, MD        Review of Systems  Constitutional: Negative for chills, fever, malaise/fatigue and weight loss.  HENT: Negative for ear discharge, ear  pain, sore throat and trouble swallowing.   Eyes: Negative for blurred vision.  Respiratory: Negative for cough, sputum production, shortness of breath and wheezing.   Cardiovascular: Negative for chest pain, palpitations, leg swelling and syncope.  Gastrointestinal: Negative for abdominal pain, blood in stool, constipation, diarrhea, heartburn, melena and nausea.  Genitourinary: Negative for dysuria, frequency, hematuria and urgency.  Musculoskeletal: Positive for neck pain. Negative for back pain, joint pain and myalgias.  Skin: Negative for rash.  Neurological: Negative for dizziness, tingling, sensory change, focal weakness, weakness, numbness and headaches.  Endo/Heme/Allergies: Negative for environmental allergies and polydipsia. Does not bruise/bleed easily.  Psychiatric/Behavioral: Negative for depression and suicidal ideas. The patient is not nervous/anxious and does not have insomnia.      Objective  Vitals:   09/16/17 1047  BP: 100/64  Pulse: 76  Weight: 165 lb (74.8 kg)  Height: 5\' 6"  (1.676 m)    Physical Exam  Constitutional: She is oriented to person, place, and time. She appears well-developed and well-nourished.  HENT:  Head: Normocephalic.  Right Ear: External ear normal.  Left Ear: External ear normal.  Mouth/Throat: Oropharynx is clear and moist.  Eyes: Pupils are equal, round, and reactive to light. Conjunctivae and EOM are normal. Lids are everted and swept, no foreign bodies found. Left eye exhibits no hordeolum. No foreign body present in the left eye. Right conjunctiva is not injected. Left conjunctiva is not injected. No scleral icterus.  Neck: Normal range of motion. Neck supple. No JVD present. No tracheal deviation present. No thyromegaly  present.  Cardiovascular: Normal rate, regular rhythm, normal heart sounds and intact distal pulses. Exam reveals no gallop and no friction rub.  No murmur heard. Pulmonary/Chest: Effort normal and breath sounds normal. No respiratory distress. She has no wheezes. She has no rales.  Abdominal: Soft. Bowel sounds are normal. She exhibits no mass. There is no hepatosplenomegaly. There is no tenderness. There is no rebound and no guarding.  Musculoskeletal: Normal range of motion. She exhibits no edema or tenderness.  Lymphadenopathy:    She has no cervical adenopathy.  Neurological: She is alert and oriented to person, place, and time. She has normal strength. She displays normal reflexes. No cranial nerve deficit.  Skin: Skin is warm. No rash noted.  Psychiatric: She has a normal mood and affect. Her mood appears not anxious. She does not exhibit a depressed mood.  Nursing note and vitals reviewed.     Assessment & Plan  Problem List Items Addressed This Visit    None    Visit Diagnoses    Cervical disc disorder with radiculopathy       Relevant Medications   predniSONE (DELTASONE) 10 MG tablet   meloxicam (MOBIC) 15 MG tablet      Meds ordered this encounter  Medications  . predniSONE (DELTASONE) 10 MG tablet    Sig: Take 1 tablet (10 mg total) by mouth daily with breakfast.    Dispense:  30 tablet    Refill:  0  . meloxicam (MOBIC) 15 MG tablet    Sig: Take 1 tablet (15 mg total) by mouth daily.    Dispense:  30 tablet    Refill:  0      Dr. Desman Polak [facility]  09/16/17

## 2017-10-17 ENCOUNTER — Ambulatory Visit: Payer: BLUE CROSS/BLUE SHIELD | Admitting: Family Medicine

## 2017-10-23 ENCOUNTER — Encounter: Payer: Self-pay | Admitting: Family Medicine

## 2017-10-23 ENCOUNTER — Ambulatory Visit (INDEPENDENT_AMBULATORY_CARE_PROVIDER_SITE_OTHER): Payer: BLUE CROSS/BLUE SHIELD | Admitting: Family Medicine

## 2017-10-23 VITALS — BP 100/60 | HR 68 | Ht 66.0 in | Wt 168.0 lb

## 2017-10-23 DIAGNOSIS — M5010 Cervical disc disorder with radiculopathy, unspecified cervical region: Secondary | ICD-10-CM | POA: Insufficient documentation

## 2017-10-23 MED ORDER — MELOXICAM 15 MG PO TABS: 15.0000 mg | ORAL_TABLET | Freq: Every day | ORAL | 0 refills | Status: DC

## 2017-10-23 NOTE — Assessment & Plan Note (Signed)
Persistent neck pain since yoga manuever 6 weeks ago. Will resume meloxicam and refer back to [facility].

## 2017-10-23 NOTE — Progress Notes (Signed)
Name: [name]   MRN: [id number]    DOB: 03/01/1966   Date:10/23/2017       Progress Note  Subjective  Chief Complaint  Chief Complaint  Patient presents with  . Follow-up    doing much better, just have a "stiff neck" now    Neck Pain   This is a new problem. The current episode started 1 to 4 weeks ago (6-7 weeks). The problem occurs constantly (continuaos stiffnes). The problem has been waxing and waning (freq and level decreased). The pain is associated with a twisting injury (with load to neck). The pain is present in the left side (posterior). The quality of the pain is described as burning. The pain is at a severity of 0/10. The pain is moderate. The symptoms are aggravated by twisting (pocketbook left arm). Pertinent negatives include no chest pain, fever, headaches, numbness, tingling, weakness or weight loss. She has tried nothing for the symptoms. The treatment provided moderate relief.    Cervical disc disorder with radiculopathy Persistent neck pain since yoga manuever 6 weeks ago. Will resume meloxicam and refer back to [facility].    Past Medical History:  Diagnosis Date  . Abnormal Pap smear 2002   LGSIL  . Allergy   . GERD (gastroesophageal reflux disease)    when on a medicine for uterine bleeding  . Hemorrhoid   . History of blood transfusion    for uterine bleeding; now resolved  . Hypothyroidism   . Shortness of breath dyspnea    with exercise since starting megace     Past Surgical History:  Procedure Laterality Date  . BREAST SURGERY  1994   left breast bx  . COLONOSCOPY W/ BIOPSIES  2013   divert. cleared for 5 yrs- [facility]  . COLPOSCOPY  2002   ABNORMAL PAP  . CYSTOSCOPY  06/06/2015   Procedure: CYSTOSCOPY;  Surgeon: Osborne Oman, MD;  Location: [facility];  Service: Gynecology;;  . DILATION AND CURETTAGE OF UTERUS     TAB  . LAPAROSCOPIC VAGINAL HYSTERECTOMY WITH SALPINGO OOPHORECTOMY N/A 06/06/2015   Procedure: LAPAROSCOPIC ASSISTED  VAGINAL HYSTERECTOMY ;  Surgeon: Osborne Oman, MD;  Location: [facility];  Service: Gynecology;  Laterality: N/A;  722.9grams   . LEEP  2002  . TUBAL LIGATION    . VAGINAL HYSTERECTOMY      Family History  Problem Relation Age of Onset  . Hypertension Father   . Diabetes Father   . Cancer Maternal Grandmother        female cancer  . Stroke Maternal Grandfather   . Diabetes Paternal Aunt   . Heart disease Paternal Aunt   . Diabetes Paternal Uncle   . Heart disease Paternal Uncle   . Colon cancer Neg Hx   . Esophageal cancer Neg Hx   . Stomach cancer Neg Hx   . Prostate cancer Neg Hx   . Kidney cancer Neg Hx   . Bladder Cancer Neg Hx     Social History   Socioeconomic History  . Marital status: Married    Spouse name: Not on file  . Number of children: 2  . Years of education: Not on file  . Highest education level: Not on file  Occupational History  . Occupation: Engineer, mining: [employer]  . Financial resource strain: Not on file  . Food insecurity:    Worry: Not on file    Inability: Not  on file  . Transportation needs:    Medical: Not on file    Non-medical: Not on file  Tobacco Use  . Smoking status: Never Smoker  . Smokeless tobacco: Never Used  Substance and Sexual Activity  . Alcohol use: No    Alcohol/week: 0.0 oz  . Drug use: No  . Sexual activity: Not Currently    Partners: Male    Birth control/protection: Surgical  Lifestyle  . Physical activity:    Days per week: Not on file    Minutes per session: Not on file  . Stress: Not on file  Relationships  . Social connections:    Talks on phone: Not on file    Gets together: Not on file    Attends religious service: Not on file    Active member of club or organization: Not on file    Attends meetings of clubs or organizations: Not on file    Relationship status: Not on file  . Intimate partner violence:    Fear of current or ex partner: Not on file     Emotionally abused: Not on file    Physically abused: Not on file    Forced sexual activity: Not on file  Other Topics Concern  . Not on file  Social History Narrative  . Not on file    Allergies  Allergen Reactions  . Doxycycline Other (See Comments)    Thrush  . Ciprofloxacin Other (See Comments)    Yeast infection  . Sulfa Antibiotics Other (See Comments)    Yeast infections    Outpatient Medications Prior to Visit  Medication Sig Dispense Refill  . Acetaminophen (TYLENOL ARTHRITIS PAIN PO) Take 2 tablets by mouth daily as needed (pain).    . Black Cohosh-SoyIsoflav-Magnol (ESTROVEN MENOPAUSE RELIEF PO) Take 1 capsule daily by mouth. otc    . docusate sodium (COLACE) 250 MG capsule Take 250 mg by mouth daily.    . fluticasone (FLONASE) 50 MCG/ACT nasal spray Place 2 sprays into both nostrils daily. 16 g 6  . levothyroxine (SYNTHROID, LEVOTHROID) 50 MCG tablet Take 1 tablet (50 mcg total) daily by mouth. 90 tablet 3  . loratadine (CLARITIN) 10 MG tablet Take 10 mg by mouth daily as needed for allergies. otc    . Magnesium 250 MG TABS Take by mouth.    . METRONIDAZOLE, TOPICAL, 0.75 % LOTN Apply 1 application topically 2 (two) times daily. (Patient taking differently: Apply 1 application topically daily as needed. ) 59 mL 3  . Multiple Vitamins-Minerals (MULTIVITAMIN WITH MINERALS) tablet Take 1 tablet by mouth daily.    Marland Kitchen OVER THE COUNTER MEDICATION For sleep Camomile and Melatonin and another ingredient pt unsure of    . Probiotic Product (ALIGN) 4 MG CAPS Take 1 capsule by mouth daily. 30 capsule 6  . triamcinolone cream (KENALOG) 0.1 % Apply 1 application topically 2 (two) times daily. 30 g 0  . cyclobenzaprine (FLEXERIL) 10 MG tablet Take 1 tablet (10 mg total) by mouth 3 (three) times daily as needed for muscle spasms. 30 tablet 0  . meloxicam (MOBIC) 15 MG tablet Take 1 tablet (15 mg total) by mouth daily. 30 tablet 0  . predniSONE (DELTASONE) 10 MG tablet Take 1 tablet (10  mg total) by mouth daily with breakfast. 30 tablet 0   Facility-Administered Medications Prior to Visit  Medication Dose Route Frequency Provider Last Rate Last Dose  . 0.9 %  sodium chloride infusion  500 mL Intravenous Continuous Pyrtle, Ulice Dash  M, MD        Review of Systems  Constitutional: Negative for chills, fever, malaise/fatigue and weight loss.  HENT: Negative for ear discharge, ear pain and sore throat.   Eyes: Negative for blurred vision.  Respiratory: Negative for cough, sputum production, shortness of breath and wheezing.   Cardiovascular: Negative for chest pain, palpitations and leg swelling.  Gastrointestinal: Negative for abdominal pain, blood in stool, constipation, diarrhea, heartburn, melena and nausea.  Genitourinary: Negative for dysuria, frequency, hematuria and urgency.  Musculoskeletal: Positive for neck pain. Negative for back pain, joint pain and myalgias.  Skin: Negative for rash.  Neurological: Negative for dizziness, tingling, sensory change, focal weakness, weakness, numbness and headaches.  Endo/Heme/Allergies: Negative for environmental allergies and polydipsia. Does not bruise/bleed easily.  Psychiatric/Behavioral: Negative for depression and suicidal ideas. The patient is not nervous/anxious and does not have insomnia.      Objective  Vitals:   10/23/17 1024  BP: 100/60  Pulse: 68  Weight: 168 lb (76.2 kg)  Height: 5\' 6"  (1.676 m)    Physical Exam  Constitutional: No distress.  HENT:  Head: Normocephalic and atraumatic.  Right Ear: External ear normal.  Left Ear: External ear normal.  Nose: Nose normal.  Mouth/Throat: Oropharynx is clear and moist.  Eyes: Pupils are equal, round, and reactive to light. Conjunctivae and EOM are normal. Right eye exhibits no discharge. Left eye exhibits no discharge.  Neck: Normal range of motion. Neck supple. No JVD present. No thyromegaly present.  Cardiovascular: Normal rate, regular rhythm, normal heart  sounds and intact distal pulses. Exam reveals no gallop and no friction rub.  No murmur heard. Pulmonary/Chest: Effort normal and breath sounds normal.  Abdominal: Soft. Bowel sounds are normal. She exhibits no mass. There is no tenderness. There is no guarding.  Musculoskeletal: She exhibits no edema.       Cervical back: She exhibits decreased range of motion and spasm. She exhibits no tenderness, no bony tenderness and no swelling.  Lymphadenopathy:    She has no cervical adenopathy.  Neurological: She is alert. She has normal strength and normal reflexes. No sensory deficit.  Reflex Scores:      Tricep reflexes are 2+ on the right side and 2+ on the left side.      Bicep reflexes are 2+ on the right side and 2+ on the left side.      Brachioradialis reflexes are 2+ on the right side and 2+ on the left side.      Patellar reflexes are 2+ on the right side and 2+ on the left side.      Achilles reflexes are 2+ on the right side and 2+ on the left side. Skin: Skin is warm and dry. She is not diaphoretic.  Nursing note and vitals reviewed.     Assessment & Plan  Problem List Items Addressed This Visit      Musculoskeletal and Integument   Cervical disc disorder with radiculopathy - Primary    Persistent neck pain since yoga manuever 6 weeks ago. Will resume meloxicam and refer back to [facility].       Relevant Medications   meloxicam (MOBIC) 15 MG tablet   Other Relevant Orders   Ambulatory referral to Orthopedic Surgery      Meds ordered this encounter  Medications  . meloxicam (MOBIC) 15 MG tablet    Sig: Take 1 tablet (15 mg total) by mouth daily.    Dispense:  30 tablet  Refill:  0      Dr. Bilbo Carcamo [facility]  10/23/17

## 2017-11-05 DIAGNOSIS — M5030 Other cervical disc degeneration, unspecified cervical region: Secondary | ICD-10-CM | POA: Diagnosis not present

## 2017-11-05 DIAGNOSIS — M5412 Radiculopathy, cervical region: Secondary | ICD-10-CM | POA: Diagnosis not present

## 2017-12-09 DIAGNOSIS — M542 Cervicalgia: Secondary | ICD-10-CM | POA: Diagnosis not present

## 2017-12-09 DIAGNOSIS — M6281 Muscle weakness (generalized): Secondary | ICD-10-CM | POA: Diagnosis not present

## 2017-12-16 DIAGNOSIS — M6281 Muscle weakness (generalized): Secondary | ICD-10-CM | POA: Diagnosis not present

## 2017-12-16 DIAGNOSIS — M542 Cervicalgia: Secondary | ICD-10-CM | POA: Diagnosis not present

## 2017-12-18 DIAGNOSIS — M542 Cervicalgia: Secondary | ICD-10-CM | POA: Diagnosis not present

## 2017-12-18 DIAGNOSIS — M6281 Muscle weakness (generalized): Secondary | ICD-10-CM | POA: Diagnosis not present

## 2017-12-23 DIAGNOSIS — M542 Cervicalgia: Secondary | ICD-10-CM | POA: Diagnosis not present

## 2017-12-23 DIAGNOSIS — M6281 Muscle weakness (generalized): Secondary | ICD-10-CM | POA: Diagnosis not present

## 2017-12-25 DIAGNOSIS — M5030 Other cervical disc degeneration, unspecified cervical region: Secondary | ICD-10-CM | POA: Diagnosis not present

## 2017-12-25 DIAGNOSIS — M542 Cervicalgia: Secondary | ICD-10-CM | POA: Diagnosis not present

## 2017-12-25 DIAGNOSIS — M6281 Muscle weakness (generalized): Secondary | ICD-10-CM | POA: Diagnosis not present

## 2017-12-30 DIAGNOSIS — M542 Cervicalgia: Secondary | ICD-10-CM | POA: Diagnosis not present

## 2017-12-30 DIAGNOSIS — M5030 Other cervical disc degeneration, unspecified cervical region: Secondary | ICD-10-CM | POA: Diagnosis not present

## 2017-12-30 DIAGNOSIS — M6281 Muscle weakness (generalized): Secondary | ICD-10-CM | POA: Diagnosis not present

## 2017-12-31 DIAGNOSIS — M542 Cervicalgia: Secondary | ICD-10-CM | POA: Diagnosis not present

## 2017-12-31 DIAGNOSIS — M6281 Muscle weakness (generalized): Secondary | ICD-10-CM | POA: Diagnosis not present

## 2017-12-31 DIAGNOSIS — M5030 Other cervical disc degeneration, unspecified cervical region: Secondary | ICD-10-CM | POA: Diagnosis not present

## 2018-01-06 DIAGNOSIS — M6281 Muscle weakness (generalized): Secondary | ICD-10-CM | POA: Diagnosis not present

## 2018-01-06 DIAGNOSIS — M542 Cervicalgia: Secondary | ICD-10-CM | POA: Diagnosis not present

## 2018-01-06 DIAGNOSIS — M5030 Other cervical disc degeneration, unspecified cervical region: Secondary | ICD-10-CM | POA: Diagnosis not present

## 2018-01-08 DIAGNOSIS — M6281 Muscle weakness (generalized): Secondary | ICD-10-CM | POA: Diagnosis not present

## 2018-01-08 DIAGNOSIS — M5030 Other cervical disc degeneration, unspecified cervical region: Secondary | ICD-10-CM | POA: Diagnosis not present

## 2018-01-08 DIAGNOSIS — M542 Cervicalgia: Secondary | ICD-10-CM | POA: Diagnosis not present

## 2018-01-28 ENCOUNTER — Other Ambulatory Visit: Payer: Self-pay | Admitting: Family Medicine

## 2018-02-02 ENCOUNTER — Ambulatory Visit (INDEPENDENT_AMBULATORY_CARE_PROVIDER_SITE_OTHER): Payer: BLUE CROSS/BLUE SHIELD | Admitting: Family Medicine

## 2018-02-02 ENCOUNTER — Encounter: Payer: Self-pay | Admitting: Family Medicine

## 2018-02-02 VITALS — BP 120/70 | HR 60 | Ht 66.0 in | Wt 170.0 lb

## 2018-02-02 DIAGNOSIS — E039 Hypothyroidism, unspecified: Secondary | ICD-10-CM | POA: Diagnosis not present

## 2018-02-02 NOTE — Progress Notes (Signed)
Date:  02/02/2018   Name:  [name]   DOB:  12-05-1965   MRN:  [id number]   Chief Complaint: Hypothyroidism Thyroid Problem  Presents for follow-up visit. Symptoms include anxiety, cold intolerance, constipation, depressed mood, diaphoresis, dry skin, fatigue, hair loss, hoarse voice, leg swelling, nail problem and weight gain. Patient reports no diarrhea, heat intolerance, menstrual problem, palpitations, tremors, visual change or weight loss. The symptoms have been stable.     Review of Systems  Constitutional: Positive for diaphoresis, fatigue and weight gain. Negative for chills, fever, unexpected weight change and weight loss.  HENT: Positive for hoarse voice. Negative for congestion, ear discharge, ear pain, rhinorrhea, sinus pressure, sneezing and sore throat.   Eyes: Negative for photophobia, pain, discharge, redness and itching.  Respiratory: Negative for cough, shortness of breath, wheezing and stridor.   Cardiovascular: Negative for palpitations.  Gastrointestinal: Positive for constipation. Negative for abdominal pain, blood in stool, diarrhea, nausea and vomiting.  Endocrine: Positive for cold intolerance. Negative for heat intolerance, polydipsia, polyphagia and polyuria.  Genitourinary: Negative for dysuria, flank pain, frequency, hematuria, menstrual problem, pelvic pain, urgency, vaginal bleeding and vaginal discharge.  Musculoskeletal: Negative for arthralgias, back pain and myalgias.  Skin: Negative for rash.  Allergic/Immunologic: Negative for environmental allergies and food allergies.  Neurological: Negative for dizziness, tremors, weakness, light-headedness, numbness and headaches.  Hematological: Negative for adenopathy. Does not bruise/bleed easily.  Psychiatric/Behavioral: Negative for dysphoric mood. The patient is nervous/anxious.     Patient Active Problem List   Diagnosis Date Noted  . Cervical disc disorder with radiculopathy 10/23/2017  .  Insomnia 08/24/2015  . Stress incontinence in female 08/24/2015  . FH: diabetes mellitus 08/24/2015  . S/P laparoscopic assisted vaginal hysterectomy (LAVH) 06/06/2015  . Rosacea 02/24/2015  . Hypothyroidism 01/26/2015  . Systolic murmur 36/14/4315  . IBS (irritable bowel syndrome) 06/13/2011  . Hemorrhoid 06/04/2011    Allergies  Allergen Reactions  . Doxycycline Other (See Comments)    Thrush  . Ciprofloxacin Other (See Comments)    Yeast infection  . Sulfa Antibiotics Other (See Comments)    Yeast infections    Past Surgical History:  Procedure Laterality Date  . BREAST SURGERY  1994   left breast bx  . COLONOSCOPY W/ BIOPSIES  2013   divert. cleared for 5 yrs- [facility]  . COLPOSCOPY  2002   ABNORMAL PAP  . CYSTOSCOPY  06/06/2015   Procedure: CYSTOSCOPY;  Surgeon: Osborne Oman, MD;  Location: [facility];  Service: Gynecology;;  . DILATION AND CURETTAGE OF UTERUS     TAB  . LAPAROSCOPIC VAGINAL HYSTERECTOMY WITH SALPINGO OOPHORECTOMY N/A 06/06/2015   Procedure: LAPAROSCOPIC ASSISTED VAGINAL HYSTERECTOMY ;  Surgeon: Osborne Oman, MD;  Location: [facility];  Service: Gynecology;  Laterality: N/A;  722.9grams   . LEEP  2002  . TUBAL LIGATION    . VAGINAL HYSTERECTOMY      Social History   Tobacco Use  . Smoking status: Never Smoker  . Smokeless tobacco: Never Used  Substance Use Topics  . Alcohol use: No    Alcohol/week: 0.0 standard drinks  . Drug use: No     Medication list has been reviewed and updated.  Current Meds  Medication Sig  . Acetaminophen (TYLENOL ARTHRITIS PAIN PO) Take 2 tablets by mouth daily as needed (pain).  . Black Cohosh-SoyIsoflav-Magnol (ESTROVEN MENOPAUSE RELIEF PO) Take 1 capsule daily by mouth. otc  . docusate sodium (COLACE) 250 MG capsule Take 250 mg  by mouth daily.  Marland Kitchen levothyroxine (SYNTHROID, LEVOTHROID) 50 MCG tablet TAKE 1 TABLET BY MOUTH  DAILY  . loratadine (CLARITIN) 10 MG tablet Take 10 mg by mouth daily as needed for  allergies. otc  . Magnesium 250 MG TABS Take by mouth.  . METRONIDAZOLE, TOPICAL, 0.75 % LOTN Apply 1 application topically 2 (two) times daily. (Patient taking differently: Apply 1 application topically daily as needed. )  . Multiple Vitamins-Minerals (MULTIVITAMIN WITH MINERALS) tablet Take 1 tablet by mouth daily.  Marland Kitchen OVER THE COUNTER MEDICATION For sleep Camomile and Melatonin and another ingredient pt unsure of  . Probiotic Product (ALIGN) 4 MG CAPS Take 1 capsule by mouth daily.   Current Facility-Administered Medications for the 02/02/18 encounter (Office Visit) with Juline Patch, MD  Medication  . 0.9 %  sodium chloride infusion    PHQ 2/9 Scores 03/24/2017 08/24/2015  PHQ - 2 Score 0 0  PHQ- 9 Score 2 -    Physical Exam  Constitutional: She is oriented to person, place, and time. She appears well-developed and well-nourished.  HENT:  Head: Normocephalic.  Right Ear: External ear normal.  Left Ear: External ear normal.  Mouth/Throat: Oropharynx is clear and moist.  Eyes: Pupils are equal, round, and reactive to light. Conjunctivae and EOM are normal. Lids are everted and swept, no foreign bodies found. Left eye exhibits no hordeolum. No foreign body present in the left eye. Right conjunctiva is not injected. Left conjunctiva is not injected. No scleral icterus.  Neck: Normal range of motion. Neck supple. No JVD present. No tracheal deviation present. No thyromegaly present.  Cardiovascular: Normal rate, regular rhythm, normal heart sounds and intact distal pulses. Exam reveals no gallop and no friction rub.  No murmur heard. Pulmonary/Chest: Effort normal and breath sounds normal. No respiratory distress. She has no wheezes. She has no rales.  Abdominal: Soft. Bowel sounds are normal. She exhibits no mass. There is no hepatosplenomegaly. There is no tenderness. There is no rebound and no guarding.  Musculoskeletal: Normal range of motion. She exhibits no edema or tenderness.    Lymphadenopathy:    She has no cervical adenopathy.  Neurological: She is alert and oriented to person, place, and time. She has normal strength. She displays normal reflexes. No cranial nerve deficit.  Skin: Skin is warm. No rash noted.  Psychiatric: She has a normal mood and affect. Her mood appears not anxious. She does not exhibit a depressed mood.    BP 120/70   Pulse 60   Ht 5\' 6"  (1.676 m)   Wt 170 lb (77.1 kg)   LMP 12/12/2014   BMI 27.44 kg/m   Assessment and Plan: 1. Hypothyroidism, unspecified type Chronic Controlled. Will check TSH with thyroid panel and adjust levothyroxine accordingly.- Thyroid Panel With TSH   Dr. Otilio Miu [facility]  02/02/2018

## 2018-02-03 LAB — THYROID PANEL WITH TSH
Free Thyroxine Index: 2.6 (ref 1.2–4.9)
T3 Uptake Ratio: 26 % (ref 24–39)
T4, Total: 10.1 ug/dL (ref 4.5–12.0)
TSH: 3.36 u[IU]/mL (ref 0.450–4.500)

## 2018-04-02 ENCOUNTER — Ambulatory Visit (INDEPENDENT_AMBULATORY_CARE_PROVIDER_SITE_OTHER): Payer: BLUE CROSS/BLUE SHIELD | Admitting: Family Medicine

## 2018-04-02 ENCOUNTER — Encounter: Payer: Self-pay | Admitting: Family Medicine

## 2018-04-02 VITALS — BP 120/70 | HR 74 | Ht 66.0 in | Wt 173.0 lb

## 2018-04-02 DIAGNOSIS — Z1211 Encounter for screening for malignant neoplasm of colon: Secondary | ICD-10-CM | POA: Diagnosis not present

## 2018-04-02 DIAGNOSIS — Z1239 Encounter for other screening for malignant neoplasm of breast: Secondary | ICD-10-CM

## 2018-04-02 DIAGNOSIS — E663 Overweight: Secondary | ICD-10-CM | POA: Diagnosis not present

## 2018-04-02 DIAGNOSIS — Z0000 Encounter for general adult medical examination without abnormal findings: Secondary | ICD-10-CM | POA: Diagnosis not present

## 2018-04-02 LAB — HEMOCCULT GUIAC POC 1CARD (OFFICE): FECAL OCCULT BLD: NEGATIVE

## 2018-04-02 NOTE — Patient Instructions (Signed)
Carbohydrate Counting for Diabetes Mellitus, Adult Carbohydrate counting is a method for keeping track of how many carbohydrates you eat. Eating carbohydrates naturally increases the amount of sugar (glucose) in the blood. Counting how many carbohydrates you eat helps keep your blood glucose within normal limits, which helps you manage your diabetes (diabetes mellitus). It is important to know how many carbohydrates you can safely have in each meal. This is different for every person. A diet and nutrition specialist (registered dietitian) can help you make a meal plan and calculate how many carbohydrates you should have at each meal and snack. Carbohydrates are found in the following foods:  Grains, such as breads and cereals.  Dried beans and soy products.  Starchy vegetables, such as potatoes, peas, and corn.  Fruit and fruit juices.  Milk and yogurt.  Sweets and snack foods, such as cake, cookies, candy, chips, and soft drinks.  How do I count carbohydrates? There are two ways to count carbohydrates in food. You can use either of the methods or a combination of both. Reading "Nutrition Facts" on packaged food The "Nutrition Facts" list is included on the labels of almost all packaged foods and beverages in the [place] It includes:  The serving size.  Information about nutrients in each serving, including the grams (g) of carbohydrate per serving.  To use the "Nutrition Facts":  Decide how many servings you will have.  Multiply the number of servings by the number of carbohydrates per serving.  The resulting number is the total amount of carbohydrates that you will be having.  Learning standard serving sizes of other foods When you eat foods containing carbohydrates that are not packaged or do not include "Nutrition Facts" on the label, you need to measure the servings in order to count the amount of carbohydrates:  Measure the foods that you will eat with a food scale or  measuring cup, if needed.  Decide how many standard-size servings you will eat.  Multiply the number of servings by 15. Most carbohydrate-rich foods have about 15 g of carbohydrates per serving. ? For example, if you eat 8 oz (170 g) of strawberries, you will have eaten 2 servings and 30 g of carbohydrates (2 servings x 15 g = 30 g).  For foods that have more than one food mixed, such as soups and casseroles, you must count the carbohydrates in each food that is included.  The following list contains standard serving sizes of common carbohydrate-rich foods. Each of these servings has about 15 g of carbohydrates:   hamburger bun or  English muffin.   oz (15 mL) syrup.   oz (14 g) jelly.  1 slice of bread.  1 six-inch tortilla.  3 oz (85 g) cooked rice or pasta.  4 oz (113 g) cooked dried beans.  4 oz (113 g) starchy vegetable, such as peas, corn, or potatoes.  4 oz (113 g) hot cereal.  4 oz (113 g) mashed potatoes or  of a large baked potato.  4 oz (113 g) canned or frozen fruit.  4 oz (120 mL) fruit juice.  4-6 crackers.  6 chicken nuggets.  6 oz (170 g) unsweetened dry cereal.  6 oz (170 g) plain fat-free yogurt or yogurt sweetened with artificial sweeteners.  8 oz (240 mL) milk.  8 oz (170 g) fresh fruit or one small piece of fruit.  24 oz (680 g) popped popcorn.  Example of carbohydrate counting Sample meal  3 oz (85 g) chicken breast.    6 oz (170 g) brown rice.  4 oz (113 g) corn.  8 oz (240 mL) milk.  8 oz (170 g) strawberries with sugar-free whipped topping. Carbohydrate calculation 1. Identify the foods that contain carbohydrates: ? Rice. ? Corn. ? Milk. ? Strawberries. 2. Calculate how many servings you have of each food: ? 2 servings rice. ? 1 serving corn. ? 1 serving milk. ? 1 serving strawberries. 3. Multiply each number of servings by 15 g: ? 2 servings rice x 15 g = 30 g. ? 1 serving corn x 15 g = 15 g. ? 1 serving milk x 15  g = 15 g. ? 1 serving strawberries x 15 g = 15 g. 4. Add together all of the amounts to find the total grams of carbohydrates eaten: ? 30 g + 15 g + 15 g + 15 g = 75 g of carbohydrates total. This information is not intended to replace advice given to you by your health care provider. Make sure you discuss any questions you have with your health care provider. Document Released: 04/29/2005 Document Revised: 11/17/2015 Document Reviewed: 10/11/2015 Elsevier Interactive Patient Education  2018 [place] for [employer] Calories are units of energy. Your body needs a certain amount of calories from food to keep you going throughout the day. When you eat more calories than your body needs, your body stores the extra calories as fat. When you eat fewer calories than your body needs, your body burns fat to get the energy it needs. Calorie counting means keeping track of how many calories you eat and drink each day. Calorie counting can be helpful if you need to lose weight. If you make sure to eat fewer calories than your body needs, you should lose weight. Ask your health care provider what a healthy weight is for you. For calorie counting to work, you will need to eat the right number of calories in a day in order to lose a healthy amount of weight per week. A dietitian can help you determine how many calories you need in a day and will give you suggestions on how to reach your calorie goal.  A healthy amount of weight to lose per week is usually 1-2 lb (0.5-0.9 kg). This usually means that your daily calorie intake should be reduced by 500-750 calories.  Eating 1,200 - 1,500 calories per day can help most women lose weight.  Eating 1,500 - 1,800 calories per day can help most men lose weight.  What is my plan? My goal is to have __________ calories per day. If I have this many calories per day, I should lose around __________ pounds per week. What do I need to know about  calorie counting? In order to meet your daily calorie goal, you will need to:  Find out how many calories are in each food you would like to eat. Try to do this before you eat.  Decide how much of the food you plan to eat.  Write down what you ate and how many calories it had. Doing this is called keeping a food log.  To successfully lose weight, it is important to balance calorie counting with a healthy lifestyle that includes regular activity. Aim for 150 minutes of moderate exercise (such as walking) or 75 minutes of vigorous exercise (such as running) each week. Where do I find calorie information?  The number of calories in a food can be found on a Nutrition Facts label. If a  food does not have a Nutrition Facts label, try to look up the calories online or ask your dietitian for help. Remember that calories are listed per serving. If you choose to have more than one serving of a food, you will have to multiply the calories per serving by the amount of servings you plan to eat. For example, the label on a package of bread might say that a serving size is 1 slice and that there are 90 calories in a serving. If you eat 1 slice, you will have eaten 90 calories. If you eat 2 slices, you will have eaten 180 calories. How do I keep a food log? Immediately after each meal, record the following information in your food log:  What you ate. Don't forget to include toppings, sauces, and other extras on the food.  How much you ate. This can be measured in cups, ounces, or number of items.  How many calories each food and drink had.  The total number of calories in the meal.  Keep your food log near you, such as in a small notebook in your pocket, or use a mobile app or website. Some programs will calculate calories for you and show you how many calories you have left for the day to meet your goal. What are some calorie counting tips?  Use your calories on foods and drinks that will fill you up  and not leave you hungry: ? Some examples of foods that fill you up are nuts and nut butters, vegetables, lean proteins, and high-fiber foods like whole grains. High-fiber foods are foods with more than 5 g fiber per serving. ? Drinks such as sodas, specialty coffee drinks, alcohol, and juices have a lot of calories, yet do not fill you up.  Eat nutritious foods and avoid empty calories. Empty calories are calories you get from foods or beverages that do not have many vitamins or protein, such as candy, sweets, and soda. It is better to have a nutritious high-calorie food (such as an avocado) than a food with few nutrients (such as a bag of chips).  Know how many calories are in the foods you eat most often. This will help you calculate calorie counts faster.  Pay attention to calories in drinks. Low-calorie drinks include water and unsweetened drinks.  Pay attention to nutrition labels for "low fat" or "fat free" foods. These foods sometimes have the same amount of calories or more calories than the full fat versions. They also often have added sugar, starch, or salt, to make up for flavor that was removed with the fat.  Find a way of tracking calories that works for you. Get creative. Try different apps or programs if writing down calories does not work for you. What are some portion control tips?  Know how many calories are in a serving. This will help you know how many servings of a certain food you can have.  Use a measuring cup to measure serving sizes. You could also try weighing out portions on a kitchen scale. With time, you will be able to estimate serving sizes for some foods.  Take some time to put servings of different foods on your favorite plates, bowls, and cups so you know what a serving looks like.  Try not to eat straight from a bag or box. Doing this can lead to overeating. Put the amount you would like to eat in a cup or on a plate to make sure you are eating the  right  portion.  Use smaller plates, glasses, and bowls to prevent overeating.  Try not to multitask (for example, watch TV or use your computer) while eating. If it is time to eat, sit down at a table and enjoy your food. This will help you to know when you are full. It will also help you to be aware of what you are eating and how much you are eating. What are tips for following this plan? Reading food labels  Check the calorie count compared to the serving size. The serving size may be smaller than what you are used to eating.  Check the source of the calories. Make sure the food you are eating is high in vitamins and protein and low in saturated and trans fats. Shopping  Read nutrition labels while you shop. This will help you make healthy decisions before you decide to purchase your food.  Make a grocery list and stick to it. Cooking  Try to cook your favorite foods in a healthier way. For example, try baking instead of frying.  Use low-fat dairy products. Meal planning  Use more fruits and vegetables. Half of your plate should be fruits and vegetables.  Include lean proteins like poultry and fish. How do I count calories when eating out?  Ask for smaller portion sizes.  Consider sharing an entree and sides instead of getting your own entree.  If you get your own entree, eat only half. Ask for a box at the beginning of your meal and put the rest of your entree in it so you are not tempted to eat it.  If calories are listed on the menu, choose the lower calorie options.  Choose dishes that include vegetables, fruits, whole grains, low-fat dairy products, and lean protein.  Choose items that are boiled, broiled, grilled, or steamed. Stay away from items that are buttered, battered, fried, or served with cream sauce. Items labeled "crispy" are usually fried, unless stated otherwise.  Choose water, low-fat milk, unsweetened iced tea, or other drinks without added sugar. If you want  an alcoholic beverage, choose a lower calorie option such as a glass of wine or light beer.  Ask for dressings, sauces, and syrups on the side. These are usually high in calories, so you should limit the amount you eat.  If you want a salad, choose a garden salad and ask for grilled meats. Avoid extra toppings like bacon, cheese, or fried items. Ask for the dressing on the side, or ask for olive oil and vinegar or lemon to use as dressing.  Estimate how many servings of a food you are given. For example, a serving of cooked rice is  cup or about the size of half a baseball. Knowing serving sizes will help you be aware of how much food you are eating at restaurants. The list below tells you how big or small some common portion sizes are based on everyday objects: ? 1 oz-4 stacked dice. ? 3 oz-1 deck of cards. ? 1 tsp-1 die. ? 1 Tbsp- a ping-pong ball. ? 2 Tbsp-1 ping-pong ball. ?  cup- baseball. ? 1 cup-1 baseball. Summary  Calorie counting means keeping track of how many calories you eat and drink each day. If you eat fewer calories than your body needs, you should lose weight.  A healthy amount of weight to lose per week is usually 1-2 lb (0.5-0.9 kg). This usually means reducing your daily calorie intake by 500-750 calories.  The number of calories  in a food can be found on a Nutrition Facts label. If a food does not have a Nutrition Facts label, try to look up the calories online or ask your dietitian for help.  Use your calories on foods and drinks that will fill you up, and not on foods and drinks that will leave you hungry.  Use smaller plates, glasses, and bowls to prevent overeating. This information is not intended to replace advice given to you by your health care provider. Make sure you discuss any questions you have with your health care provider. Document Released: 04/29/2005 Document Revised: 03/29/2016 Document Reviewed: 03/29/2016 Elsevier Interactive Patient Education   [employer].

## 2018-04-02 NOTE — Progress Notes (Addendum)
Date:  04/02/2018   Name:  [name]   DOB:  1965-10-29   MRN:  [id number]   Chief Complaint: Annual Exam (no pap- needs mammo) Patient is a 52 year old female who presents for a comprehensive physical exam. The patient reports the following problems: weight gain. Health maintenance has been reviewed up to date.    Review of Systems  Constitutional: Positive for unexpected weight change. Negative for chills, fatigue and fever.  HENT: Negative for congestion, ear discharge, ear pain, rhinorrhea, sinus pressure, sneezing and sore throat.   Eyes: Negative for photophobia, pain, discharge, redness and itching.  Respiratory: Negative for cough, shortness of breath, wheezing and stridor.   Cardiovascular: Negative for chest pain, palpitations and leg swelling.  Gastrointestinal: Negative for abdominal pain, blood in stool, constipation, diarrhea, nausea and vomiting.  Endocrine: Negative for cold intolerance, heat intolerance, polydipsia, polyphagia and polyuria.  Genitourinary: Negative for dysuria, flank pain, frequency, hematuria, menstrual problem, pelvic pain, urgency, vaginal bleeding and vaginal discharge.  Musculoskeletal: Negative for arthralgias, back pain and myalgias.  Skin: Negative for color change, pallor, rash and wound.  Allergic/Immunologic: Negative for environmental allergies and food allergies.  Neurological: Negative for dizziness, weakness, light-headedness, numbness and headaches.  Hematological: Negative for adenopathy. Does not bruise/bleed easily.  Psychiatric/Behavioral: Negative for dysphoric mood. The patient is not nervous/anxious.     Patient Active Problem List   Diagnosis Date Noted  . Cervical disc disorder with radiculopathy 10/23/2017  . Insomnia 08/24/2015  . Stress incontinence in female 08/24/2015  . FH: diabetes mellitus 08/24/2015  . S/P laparoscopic assisted vaginal hysterectomy (LAVH) 06/06/2015  . Rosacea 02/24/2015  . Hypothyroidism  01/26/2015  . Systolic murmur 61/44/3154  . IBS (irritable bowel syndrome) 06/13/2011  . Hemorrhoid 06/04/2011    Allergies  Allergen Reactions  . Doxycycline Other (See Comments)    Thrush  . Ciprofloxacin Other (See Comments)    Yeast infection  . Sulfa Antibiotics Other (See Comments)    Yeast infections    Past Surgical History:  Procedure Laterality Date  . BREAST SURGERY  1994   left breast bx  . COLONOSCOPY W/ BIOPSIES  2013   divert. cleared for 5 yrs- [facility]  . COLPOSCOPY  2002   ABNORMAL PAP  . CYSTOSCOPY  06/06/2015   Procedure: CYSTOSCOPY;  Surgeon: Osborne Oman, MD;  Location: [facility];  Service: Gynecology;;  . DILATION AND CURETTAGE OF UTERUS     TAB  . LAPAROSCOPIC VAGINAL HYSTERECTOMY WITH SALPINGO OOPHORECTOMY N/A 06/06/2015   Procedure: LAPAROSCOPIC ASSISTED VAGINAL HYSTERECTOMY ;  Surgeon: Osborne Oman, MD;  Location: [facility];  Service: Gynecology;  Laterality: N/A;  722.9grams   . LEEP  2002  . TUBAL LIGATION    . VAGINAL HYSTERECTOMY      Social History   Tobacco Use  . Smoking status: Never Smoker  . Smokeless tobacco: Never Used  Substance Use Topics  . Alcohol use: No    Alcohol/week: 0.0 standard drinks  . Drug use: No     Medication list has been reviewed and updated.  Current Meds  Medication Sig  . Acetaminophen (TYLENOL ARTHRITIS PAIN PO) Take 2 tablets by mouth daily as needed (pain).  . Black Cohosh-SoyIsoflav-Magnol (ESTROVEN MENOPAUSE RELIEF PO) Take 1 capsule daily by mouth. otc  . docusate sodium (COLACE) 250 MG capsule Take 250 mg by mouth daily.  Marland Kitchen levothyroxine (SYNTHROID, LEVOTHROID) 50 MCG tablet TAKE 1 TABLET BY MOUTH  DAILY  .  loratadine (CLARITIN) 10 MG tablet Take 10 mg by mouth daily as needed for allergies. otc  . Magnesium 250 MG TABS Take by mouth.  . METRONIDAZOLE, TOPICAL, 0.75 % LOTN Apply 1 application topically 2 (two) times daily. (Patient taking differently: Apply 1 application topically daily as  needed. )  . Multiple Vitamins-Minerals (MULTIVITAMIN WITH MINERALS) tablet Take 1 tablet by mouth daily.  Marland Kitchen OVER THE COUNTER MEDICATION For sleep Camomile and Melatonin and another ingredient pt unsure of  . Probiotic Product (ALIGN) 4 MG CAPS Take 1 capsule by mouth daily.   Current Facility-Administered Medications for the 04/02/18 encounter (Office Visit) with Juline Patch, MD  Medication  . 0.9 %  sodium chloride infusion    PHQ 2/9 Scores 03/24/2017 08/24/2015  PHQ - 2 Score 0 0  PHQ- 9 Score 2 -    Physical Exam  Constitutional: She is oriented to person, place, and time. Vital signs are normal. She appears well-developed and well-nourished.  HENT:  Head: Normocephalic and atraumatic.  Right Ear: Hearing, tympanic membrane, external ear and ear canal normal.  Left Ear: Hearing, tympanic membrane, external ear and ear canal normal.  Nose: Nose normal.  Mouth/Throat: Uvula is midline and oropharynx is clear and moist. No oropharyngeal exudate, posterior oropharyngeal edema or posterior oropharyngeal erythema.  Eyes: Pupils are equal, round, and reactive to light. Conjunctivae, EOM and lids are normal. Lids are everted and swept, no foreign bodies found. Left eye exhibits no hordeolum. No foreign body present in the left eye. Right conjunctiva is not injected. Right conjunctiva has no hemorrhage. Left conjunctiva is not injected. Left conjunctiva has no hemorrhage. No scleral icterus.  Neck: Trachea normal, normal range of motion and full passive range of motion without pain. Neck supple. Normal carotid pulses, no hepatojugular reflux and no JVD present. No tracheal tenderness present. Carotid bruit is not present. No tracheal deviation present. No thyroid mass and no thyromegaly present.  Cardiovascular: Normal rate, regular rhythm, S1 normal, S2 normal, normal heart sounds and intact distal pulses. PMI is not displaced. Exam reveals no gallop, no S3, no S4, no distant heart sounds  and no friction rub.  No murmur heard.  No systolic murmur is present.  No diastolic murmur is present. Pulses:      Carotid pulses are 2+ on the right side, and 2+ on the left side.      Radial pulses are 2+ on the right side, and 2+ on the left side.       Femoral pulses are 2+ on the right side, and 2+ on the left side.      Popliteal pulses are 2+ on the right side, and 2+ on the left side.       Dorsalis pedis pulses are 2+ on the right side, and 2+ on the left side.       Posterior tibial pulses are 2+ on the right side, and 2+ on the left side.  Pulmonary/Chest: Effort normal and breath sounds normal. No stridor. No respiratory distress. She has no decreased breath sounds. She has no wheezes. She has no rhonchi. She has no rales. Right breast exhibits no inverted nipple, no mass, no nipple discharge, no skin change and no tenderness. Left breast exhibits no inverted nipple, no mass, no nipple discharge, no skin change and no tenderness. No breast swelling, tenderness, discharge or bleeding. Breasts are symmetrical.  Abdominal: Soft. Normal appearance, normal aorta and bowel sounds are normal. She exhibits no mass. There is  no hepatosplenomegaly. There is no tenderness. There is no rebound and no guarding. No hernia.  Genitourinary: Rectum normal. Rectal exam shows no mass and guaiac negative stool. No breast swelling, tenderness, discharge or bleeding. Pelvic exam was performed with patient supine. There is no rash on the right labia. There is no rash on the left labia.  Musculoskeletal: Normal range of motion. She exhibits no edema or tenderness.       Lumbar back: Normal.  Lymphadenopathy:       Head (right side): No submandibular adenopathy present.       Head (left side): No submandibular adenopathy present.    She has no cervical adenopathy.       Right cervical: No superficial cervical adenopathy present.      Left cervical: No superficial cervical adenopathy present. No inguinal  adenopathy noted on the right or left side.  Neurological: She is alert and oriented to person, place, and time. She has normal strength and normal reflexes. She displays normal reflexes. No cranial nerve deficit.  Skin: Skin is warm. No rash noted.  Psychiatric: She has a normal mood and affect. Her mood appears not anxious. She does not exhibit a depressed mood.  Nursing note and vitals reviewed.   BP 120/70   Pulse 74   Ht 5\' 6"  (1.676 m)   Wt 173 lb (78.5 kg)   LMP 12/12/2014   BMI 27.92 kg/m   Assessment and Plan:  1. Annual physical exam [name] is a 52 y.o. female who presents today for her Complete Annual Exam. She feels  WELL.  She reports exercising . She reports she is sleeping WELL. / draw renal panel and lipid/ guaiac was negative - Renal Function Panel - Lipid panel  2. Overweight (BMI 25.0-29.9) Discussed diet control and weight loss  3. Breast cancer screening Ordered mammo for December in [place]. Instructed pt to go downstairs and sign a release of info for [employer]. - MM 3D SCREEN BREAST BILATERAL; Future [name] is a 52 y.o. female who presents today for her Complete Annual Exam. She feels well. She reports exercising some. She reports she is sleeping well.  Immunizations are reviewed and recommendations provided.   Age appropriate screening tests are discussed. Counseling given for risk factor reduction interventions.Health risks of being over weight were discussed and patient was counseled on weight loss options and exercise.Lo calorie/carbohydrate diet given. Dr. [facility]  04/02/2018

## 2018-04-03 LAB — RENAL FUNCTION PANEL
ALBUMIN: 4.5 g/dL (ref 3.5–5.5)
BUN/Creatinine Ratio: 14 (ref 9–23)
BUN: 11 mg/dL (ref 6–24)
CALCIUM: 9.9 mg/dL (ref 8.7–10.2)
CO2: 25 mmol/L (ref 20–29)
CREATININE: 0.81 mg/dL (ref 0.57–1.00)
Chloride: 102 mmol/L (ref 96–106)
GFR calc Af Amer: 96 mL/min/{1.73_m2} (ref >59)
GFR calc non Af Amer: 83 mL/min/{1.73_m2} (ref >59)
Glucose: 85 mg/dL (ref 65–99)
PHOSPHORUS: 3.7 mg/dL (ref 2.5–4.5)
Potassium: 4.4 mmol/L (ref 3.5–5.2)
SODIUM: 141 mmol/L (ref 134–144)

## 2018-04-03 LAB — LIPID PANEL
CHOLESTEROL TOTAL: 174 mg/dL (ref 100–199)
Chol/HDL Ratio: 3.3 ratio (ref 0.0–4.4)
HDL: 52 mg/dL (ref >39)
LDL CALC: 101 mg/dL — AB (ref 0–99)
TRIGLYCERIDES: 106 mg/dL (ref 0–149)
VLDL CHOLESTEROL CAL: 21 mg/dL (ref 5–40)

## 2018-04-16 ENCOUNTER — Ambulatory Visit
Admission: RE | Admit: 2018-04-16 | Discharge: 2018-04-16 | Disposition: A | Discharge status: Home / Self Care | Payer: BLUE CROSS/BLUE SHIELD | Source: Ambulatory Visit | Attending: Family Medicine | Admitting: Family Medicine

## 2018-04-16 DIAGNOSIS — Z1239 Encounter for other screening for malignant neoplasm of breast: Secondary | ICD-10-CM

## 2018-04-16 DIAGNOSIS — Z1231 Encounter for screening mammogram for malignant neoplasm of breast: Secondary | ICD-10-CM | POA: Diagnosis not present

## 2018-04-23 ENCOUNTER — Other Ambulatory Visit: Payer: Self-pay | Admitting: Family Medicine

## 2018-06-23 ENCOUNTER — Other Ambulatory Visit: Payer: Self-pay

## 2018-06-23 DIAGNOSIS — E039 Hypothyroidism, unspecified: Secondary | ICD-10-CM

## 2018-06-23 MED ORDER — LEVOTHYROXINE SODIUM 50 MCG PO TABS: 50.0000 ug | ORAL_TABLET | Freq: Every day | ORAL | 0 refills | Status: DC

## 2018-08-24 ENCOUNTER — Other Ambulatory Visit: Payer: Self-pay

## 2018-08-24 ENCOUNTER — Encounter: Payer: Self-pay | Admitting: Family Medicine

## 2018-08-24 ENCOUNTER — Ambulatory Visit (INDEPENDENT_AMBULATORY_CARE_PROVIDER_SITE_OTHER): Payer: BLUE CROSS/BLUE SHIELD | Admitting: Family Medicine

## 2018-08-24 VITALS — BP 124/80 | HR 72 | Ht 66.0 in | Wt 171.0 lb

## 2018-08-24 DIAGNOSIS — H6983 Other specified disorders of Eustachian tube, bilateral: Secondary | ICD-10-CM | POA: Diagnosis not present

## 2018-08-24 DIAGNOSIS — E039 Hypothyroidism, unspecified: Secondary | ICD-10-CM | POA: Diagnosis not present

## 2018-08-24 MED ORDER — LEVOTHYROXINE SODIUM 50 MCG PO TABS: 50.0000 ug | ORAL_TABLET | Freq: Every day | ORAL | 1 refills | Status: DC

## 2018-08-24 MED ORDER — PREDNISONE 10 MG PO TABS: 10.0000 mg | ORAL_TABLET | Freq: Every day | ORAL | 0 refills | Status: DC

## 2018-08-24 NOTE — Progress Notes (Signed)
Date:  08/24/2018   Name:  [name]   DOB:  1965-07-12   MRN:  [id number]   Chief Complaint: Allergic Rhinitis  (been taking claritin and antihystamine. Now having L) ear pressure and sensitive to noise. having dizziness) and Hypothyroidism (labs needed)  Otalgia   There is pain in the left ear. This is a recurrent problem. The current episode started 1 to 4 weeks ago. The problem occurs hourly. The problem has been waxing and waning. There has been no fever. Pain severity now: fullness. Associated symptoms include headaches, hearing loss and rhinorrhea. Pertinent negatives include no abdominal pain, coughing, diarrhea, ear discharge, neck pain, rash, sore throat or vomiting. Treatments tried: claritin/ flonase/sudafed. The treatment provided mild relief. There is no history of a chronic ear infection, hearing loss or a tympanostomy tube.  Thyroid Problem  Presents for follow-up visit. Symptoms include cold intolerance, dry skin, fatigue, hair loss, nail problem, palpitations and weight gain. Patient reports no anxiety, constipation, depressed mood, diaphoresis, diarrhea, heat intolerance, hoarse voice, leg swelling, menstrual problem, tremors, visual change or weight loss.    Review of Systems  Constitutional: Positive for fatigue and weight gain. Negative for chills, diaphoresis, fever, unexpected weight change and weight loss.  HENT: Positive for ear pain, hearing loss and rhinorrhea. Negative for congestion, ear discharge, hoarse voice, sinus pressure, sneezing and sore throat.   Eyes: Negative for photophobia, pain, discharge, redness and itching.  Respiratory: Negative for cough, shortness of breath, wheezing and stridor.   Cardiovascular: Positive for palpitations.  Gastrointestinal: Negative for abdominal pain, blood in stool, constipation, diarrhea, nausea and vomiting.  Endocrine: Positive for cold intolerance. Negative for heat intolerance, polydipsia, polyphagia and  polyuria.  Genitourinary: Negative for dysuria, flank pain, frequency, hematuria, menstrual problem, pelvic pain, urgency, vaginal bleeding and vaginal discharge.  Musculoskeletal: Negative for arthralgias, back pain, myalgias and neck pain.  Skin: Negative for rash.  Allergic/Immunologic: Negative for environmental allergies and food allergies.  Neurological: Positive for headaches. Negative for dizziness, tremors, weakness, light-headedness and numbness.  Hematological: Negative for adenopathy. Does not bruise/bleed easily.  Psychiatric/Behavioral: Negative for dysphoric mood. The patient is not nervous/anxious.     Patient Active Problem List   Diagnosis Date Noted  . Cervical disc disorder with radiculopathy 10/23/2017  . Insomnia 08/24/2015  . Stress incontinence in female 08/24/2015  . FH: diabetes mellitus 08/24/2015  . S/P laparoscopic assisted vaginal hysterectomy (LAVH) 06/06/2015  . Rosacea 02/24/2015  . Hypothyroidism 01/26/2015  . Systolic murmur 70/26/3785  . IBS (irritable bowel syndrome) 06/13/2011  . Hemorrhoid 06/04/2011    Allergies  Allergen Reactions  . Doxycycline Other (See Comments)    Thrush  . Ciprofloxacin Other (See Comments)    Yeast infection  . Sulfa Antibiotics Other (See Comments)    Yeast infections    Past Surgical History:  Procedure Laterality Date  . BREAST BIOPSY Left 1995   neg  . BREAST SURGERY  1994   left breast bx  . COLONOSCOPY W/ BIOPSIES  2013   divert. cleared for 5 yrs- [facility]  . COLPOSCOPY  2002   ABNORMAL PAP  . CYSTOSCOPY  06/06/2015   Procedure: CYSTOSCOPY;  Surgeon: Osborne Oman, MD;  Location: [facility];  Service: Gynecology;;  . DILATION AND CURETTAGE OF UTERUS     TAB  . LAPAROSCOPIC VAGINAL HYSTERECTOMY WITH SALPINGO OOPHORECTOMY N/A 06/06/2015   Procedure: LAPAROSCOPIC ASSISTED VAGINAL HYSTERECTOMY ;  Surgeon: Osborne Oman, MD;  Location: [facility];  Service: Gynecology;  Laterality: N/A;  722.9grams   .  LEEP  2002  . TUBAL LIGATION    . VAGINAL HYSTERECTOMY      Social History   Tobacco Use  . Smoking status: Never Smoker  . Smokeless tobacco: Never Used  Substance Use Topics  . Alcohol use: No    Alcohol/week: 0.0 standard drinks  . Drug use: No     Medication list has been reviewed and updated.  Current Meds  Medication Sig  . Acetaminophen (TYLENOL ARTHRITIS PAIN PO) Take 2 tablets by mouth daily as needed (pain).  . Black Cohosh-SoyIsoflav-Magnol (ESTROVEN MENOPAUSE RELIEF PO) Take 1 capsule daily by mouth. otc  . docusate sodium (COLACE) 250 MG capsule Take 250 mg by mouth daily.  Marland Kitchen levothyroxine (SYNTHROID, LEVOTHROID) 50 MCG tablet Take 1 tablet (50 mcg total) by mouth daily.  Marland Kitchen loratadine (CLARITIN) 10 MG tablet Take 10 mg by mouth daily as needed for allergies. otc  . METRONIDAZOLE, TOPICAL, 0.75 % LOTN Apply 1 application topically 2 (two) times daily. (Patient taking differently: Apply 1 application topically daily as needed. )  . Multiple Vitamins-Minerals (MULTIVITAMIN WITH MINERALS) tablet Take 1 tablet by mouth daily.  Marland Kitchen OVER THE COUNTER MEDICATION For sleep Camomile and Melatonin and another ingredient pt unsure of  . Probiotic Product (ALIGN) 4 MG CAPS Take 1 capsule by mouth daily.  . [DISCONTINUED] Magnesium 250 MG TABS Take by mouth.   Current Facility-Administered Medications for the 08/24/18 encounter (Office Visit) with Juline Patch, MD  Medication  . 0.9 %  sodium chloride infusion    PHQ 2/9 Scores 03/24/2017 08/24/2015  PHQ - 2 Score 0 0  PHQ- 9 Score 2 -    BP Readings from Last 3 Encounters:  08/24/18 124/80  04/02/18 120/70  02/02/18 120/70    Physical Exam Vitals signs and nursing note reviewed.  Constitutional:      General: She is not in acute distress.    Appearance: She is not diaphoretic.  HENT:     Head: Normocephalic and atraumatic.     Jaw: There is normal jaw occlusion.     Right Ear: Ear canal and external ear normal.  Decreased hearing noted. Tympanic membrane is retracted. Tympanic membrane is not injected.     Left Ear: Ear canal and external ear normal. Decreased hearing noted. Tympanic membrane is retracted. Tympanic membrane is not injected.     Nose: Nose normal.     Mouth/Throat:     Pharynx: Oropharynx is clear.  Eyes:     General:        Right eye: No discharge.        Left eye: No discharge.     Conjunctiva/sclera: Conjunctivae normal.     Pupils: Pupils are equal, round, and reactive to light.  Neck:     Musculoskeletal: Normal range of motion and neck supple.     Thyroid: No thyromegaly.     Vascular: No JVD.  Cardiovascular:     Rate and Rhythm: Normal rate and regular rhythm.     Heart sounds: Normal heart sounds. No murmur. No friction rub. No gallop.   Pulmonary:     Effort: Pulmonary effort is normal.     Breath sounds: Normal breath sounds.  Abdominal:     General: Bowel sounds are normal.     Palpations: Abdomen is soft. There is no mass.     Tenderness: There is no abdominal tenderness. There is no guarding.  Musculoskeletal:  Normal range of motion.  Lymphadenopathy:     Cervical: No cervical adenopathy.  Skin:    General: Skin is warm and dry.  Neurological:     Mental Status: She is alert.     Deep Tendon Reflexes: Reflexes are normal and symmetric.     Wt Readings from Last 3 Encounters:  08/24/18 171 lb (77.6 kg)  04/02/18 173 lb (78.5 kg)  02/02/18 170 lb (77.1 kg)    BP 124/80   Pulse 72   Ht 5\' 6"  (1.676 m)   Wt 171 lb (77.6 kg)   LMP 12/12/2014   BMI 27.60 kg/m   Assessment and Plan: 1. Dysfunction of both eustachian tubes Acute.  Persistent.  Patient is already taking Flonase and Sudafed and antihistamine.  Will suggest that she can use for short term Afrin nasal spray and I will initiate prednisone 10 mg once a day for 7 to 10 days. - predniSONE (DELTASONE) 10 MG tablet; Take 1 tablet (10 mg total) by mouth daily with breakfast.  Dispense: 30  tablet; Refill: 0  2. Hypothyroidism, unspecified type Chronic.  Controlled.  Continue levothyroxine 50 mics micrograms daily.  Reviewed TSH and they have all been in normal limits in the last 3 years the last which was drawn 6 months ago will not obtain a TSH today and will do so in 6 months when patient is rechecked. - levothyroxine (SYNTHROID, LEVOTHROID) 50 MCG tablet; Take 1 tablet (50 mcg total) by mouth daily.  Dispense: 90 tablet; Refill: 1

## 2018-08-24 NOTE — Patient Instructions (Signed)

## 2018-10-01 ENCOUNTER — Ambulatory Visit: Payer: BLUE CROSS/BLUE SHIELD | Admitting: Family Medicine

## 2018-10-19 ENCOUNTER — Other Ambulatory Visit: Payer: Self-pay | Admitting: Family Medicine

## 2018-10-19 DIAGNOSIS — E039 Hypothyroidism, unspecified: Secondary | ICD-10-CM

## 2018-10-29 ENCOUNTER — Other Ambulatory Visit: Payer: Self-pay

## 2018-10-29 DIAGNOSIS — E039 Hypothyroidism, unspecified: Secondary | ICD-10-CM

## 2018-10-29 MED ORDER — LEVOTHYROXINE SODIUM 50 MCG PO TABS: 50.0000 ug | ORAL_TABLET | Freq: Every day | ORAL | 5 refills | Status: DC

## 2019-01-14 ENCOUNTER — Other Ambulatory Visit: Payer: Self-pay

## 2019-01-14 ENCOUNTER — Ambulatory Visit (INDEPENDENT_AMBULATORY_CARE_PROVIDER_SITE_OTHER): Payer: BC Managed Care – PPO | Admitting: Family Medicine

## 2019-01-14 ENCOUNTER — Encounter: Payer: Self-pay | Admitting: Family Medicine

## 2019-01-14 VITALS — BP 134/82 | HR 71 | Ht 66.0 in | Wt 172.0 lb

## 2019-01-14 DIAGNOSIS — M5010 Cervical disc disorder with radiculopathy, unspecified cervical region: Secondary | ICD-10-CM | POA: Diagnosis not present

## 2019-01-14 DIAGNOSIS — M25541 Pain in joints of right hand: Secondary | ICD-10-CM

## 2019-01-14 MED ORDER — MELOXICAM 15 MG PO TABS: 15.0000 mg | ORAL_TABLET | Freq: Every day | ORAL | 0 refills | Status: DC

## 2019-01-14 NOTE — Progress Notes (Signed)
Date:  01/14/2019   Name:  [name]   DOB:  [birth year]   MRN:  [id number]   Chief Complaint: Neck Pain (Last year [place] hurt neck during [place]. Was seen here in past for this - given prednisone and muscle relaxer. Was told herniated disc. Pt- in last August 2019. Neck is still having pain. Still having pain when turning head. ) and Hand Pain (Started yesterday- right thumb and wrist was swollen and painful with movement or griping things. Still sore today. )  Neck Pain  This is a chronic problem. The current episode started more than 1 month ago. The problem occurs constantly. The problem has been gradually worsening. Associated with: YOGA. The pain is present in the midline, right side and left side. The pain is at a severity of 6/10. The pain is moderate. The symptoms are aggravated by twisting and bending. The pain is same all the time. Associated symptoms include weakness. Pertinent negatives include no fever, headaches, leg pain, numbness, pain with swallowing, paresis, photophobia, syncope, trouble swallowing, visual change or weight loss. Associated symptoms comments: l hand weak. She has tried NSAIDs (steroids/physical therapy) for the symptoms.  Hand Pain  The incident occurred more than 1 week ago. There was no injury mechanism. The pain is present in the right wrist (right MC joint). The quality of the pain is described as aching. Pertinent negatives include no numbness.    Review of Systems  Constitutional: Negative.  Negative for chills, fatigue, fever, unexpected weight change and weight loss.  HENT: Negative for congestion, ear discharge, ear pain, rhinorrhea, sinus pressure, sneezing, sore throat and trouble swallowing.   Eyes: Negative for photophobia, pain, discharge, redness and itching.  Respiratory: Negative for cough, shortness of breath, wheezing and stridor.   Cardiovascular: Negative for syncope.  Gastrointestinal: Negative for abdominal pain, blood in stool,  constipation, diarrhea, nausea and vomiting.  Endocrine: Negative for cold intolerance, heat intolerance, polydipsia, polyphagia and polyuria.  Genitourinary: Negative for dysuria, flank pain, frequency, hematuria, menstrual problem, pelvic pain, urgency, vaginal bleeding and vaginal discharge.  Musculoskeletal: Positive for neck pain. Negative for arthralgias, back pain and myalgias.  Skin: Negative for rash.  Allergic/Immunologic: Negative for environmental allergies and food allergies.  Neurological: Positive for weakness. Negative for dizziness, light-headedness, numbness and headaches.  Hematological: Negative for adenopathy. Does not bruise/bleed easily.  Psychiatric/Behavioral: Negative for dysphoric mood. The patient is not nervous/anxious.     Patient Active Problem List   Diagnosis Date Noted  . Cervical disc disorder with radiculopathy 10/23/2017  . Insomnia 08/24/2015  . Stress incontinence in female 08/24/2015  . FH: diabetes mellitus 08/24/2015  . S/P laparoscopic assisted vaginal hysterectomy (LAVH) 06/06/2015  . Rosacea 02/24/2015  . Hypothyroidism 01/26/2015  . Systolic murmur 99991111  . IBS (irritable bowel syndrome) 06/13/2011  . Hemorrhoid 06/04/2011    Allergies  Allergen Reactions  . Doxycycline Other (See Comments)    Thrush  . Ciprofloxacin Other (See Comments)    Yeast infection  . Sulfa Antibiotics Other (See Comments)    Yeast infections    Past Surgical History:  Procedure Laterality Date  . BREAST BIOPSY Left 1995   neg  . BREAST SURGERY  1994   left breast bx  . COLONOSCOPY W/ BIOPSIES  2013   divert. cleared for 5 yrs- [facility]  . COLPOSCOPY  2002   ABNORMAL PAP  . CYSTOSCOPY  06/06/2015   Procedure: CYSTOSCOPY;  Surgeon: Osborne Oman, MD;  Location:  [facility];  Service: Gynecology;;  . DILATION AND CURETTAGE OF UTERUS     TAB  . LAPAROSCOPIC VAGINAL HYSTERECTOMY WITH SALPINGO OOPHORECTOMY N/A 06/06/2015   Procedure: LAPAROSCOPIC  ASSISTED VAGINAL HYSTERECTOMY ;  Surgeon: Osborne Oman, MD;  Location: [facility];  Service: Gynecology;  Laterality: N/A;  722.9grams   . LEEP  2002  . TUBAL LIGATION    . VAGINAL HYSTERECTOMY      Social History   Tobacco Use  . Smoking status: Never Smoker  . Smokeless tobacco: Never Used  Substance Use Topics  . Alcohol use: No    Alcohol/week: 0.0 standard drinks  . Drug use: No     Medication list has been reviewed and updated.  Current Meds  Medication Sig  . Acetaminophen (TYLENOL ARTHRITIS PAIN PO) Take 2 tablets by mouth daily as needed (pain).  . Black Cohosh-SoyIsoflav-Magnol (ESTROVEN MENOPAUSE RELIEF PO) Take 1 capsule daily by mouth. otc  . docusate sodium (COLACE) 250 MG capsule Take 250 mg by mouth daily.  Marland Kitchen levothyroxine (SYNTHROID) 50 MCG tablet Take 1 tablet (50 mcg total) by mouth daily.  Marland Kitchen loratadine (CLARITIN) 10 MG tablet Take 10 mg by mouth daily as needed for allergies. otc  . METRONIDAZOLE, TOPICAL, 0.75 % LOTN Apply 1 application topically 2 (two) times daily. (Patient taking differently: Apply 1 application topically daily as needed. )  . Multiple Vitamins-Minerals (MULTIVITAMIN WITH MINERALS) tablet Take 1 tablet by mouth daily.  Marland Kitchen OVER THE COUNTER MEDICATION For sleep Camomile and Melatonin and another ingredient pt unsure of  . Probiotic Product (ALIGN) 4 MG CAPS Take 1 capsule by mouth daily.   Current Facility-Administered Medications for the 01/14/19 encounter (Office Visit) with Juline Patch, MD  Medication  . 0.9 %  sodium chloride infusion    PHQ 2/9 Scores 01/14/2019 03/24/2017 08/24/2015  PHQ - 2 Score 1 0 0  PHQ- 9 Score 10 2 -    BP Readings from Last 3 Encounters:  01/14/19 134/82  08/24/18 124/80  04/02/18 120/70    Physical Exam Vitals signs and nursing note reviewed.  Constitutional:      Appearance: She is well-developed.  HENT:     Head: Normocephalic.     Right Ear: Tympanic membrane, ear canal and external ear  normal.     Left Ear: Tympanic membrane, ear canal and external ear normal.  Eyes:     General: Lids are everted, no foreign bodies appreciated. No scleral icterus.       Left eye: No foreign body or hordeolum.     Conjunctiva/sclera: Conjunctivae normal.     Right eye: Right conjunctiva is not injected.     Left eye: Left conjunctiva is not injected.     Pupils: Pupils are equal, round, and reactive to light.  Neck:     Musculoskeletal: Normal range of motion and neck supple.     Thyroid: No thyromegaly.     Vascular: No JVD.     Trachea: No tracheal deviation.  Cardiovascular:     Rate and Rhythm: Normal rate and regular rhythm.     Heart sounds: Normal heart sounds. No murmur. No friction rub. No gallop.   Pulmonary:     Effort: Pulmonary effort is normal. No respiratory distress.     Breath sounds: Normal breath sounds. No wheezing or rales.  Abdominal:     General: Bowel sounds are normal.     Palpations: Abdomen is soft. There is no mass.  Tenderness: There is no abdominal tenderness. There is no guarding or rebound.  Musculoskeletal: Normal range of motion.     Right wrist: She exhibits tenderness.     Cervical back: She exhibits tenderness and spasm.     Comments: Tender right first [facility] joint  Lymphadenopathy:     Cervical: No cervical adenopathy.  Skin:    General: Skin is warm.     Findings: No rash.  Neurological:     Mental Status: She is alert and oriented to person, place, and time.     Cranial Nerves: No cranial nerve deficit.     Deep Tendon Reflexes: Reflexes normal.  Psychiatric:        Mood and Affect: Mood is not anxious or depressed.     Wt Readings from Last 3 Encounters:  01/14/19 172 lb (78 kg)  08/24/18 171 lb (77.6 kg)  04/02/18 173 lb (78.5 kg)    BP 134/82   Pulse 71   Ht 5\' 6"  (1.676 m)   Wt 172 lb (78 kg)   LMP 12/12/2014   SpO2 98%   BMI 27.76 kg/m   Assessment and Plan:  1. Cervical disc disorder with radiculopathy This is  persistent despite previous steroid, nonsteroidal anti-inflammatory, and physical therapy.  I suspect this is going to be a degenerative disc concern in the cervical area.  We will refill her meloxicam 15 mg and refer back to [facility] Monday for reevaluation and possible x-rays, possible MRI. - meloxicam (MOBIC) 15 MG tablet; Take 1 tablet (15 mg total) by mouth daily.  Dispense: 30 tablet; Refill: 0 - Ambulatory referral to Orthopedic Surgery  2. Arthralgia of metacarpophalangeal joint, right Patient has tenderness and pain in the right MCP joint.  Will initiate meloxicam and will refer to surgery for evaluation hopefully this will need to be injected but this will be a possibility. - meloxicam (MOBIC) 15 MG tablet; Take 1 tablet (15 mg total) by mouth daily.  Dispense: 30 tablet; Refill: 0 - Ambulatory referral to Orthopedic Surgery

## 2019-01-14 NOTE — Patient Instructions (Signed)

## 2019-02-24 ENCOUNTER — Ambulatory Visit (INDEPENDENT_AMBULATORY_CARE_PROVIDER_SITE_OTHER): Payer: BC Managed Care – PPO | Admitting: Family Medicine

## 2019-02-24 ENCOUNTER — Other Ambulatory Visit: Payer: Self-pay

## 2019-02-24 ENCOUNTER — Encounter: Payer: Self-pay | Admitting: Family Medicine

## 2019-02-24 VITALS — BP 102/70 | HR 64 | Ht 66.0 in | Wt 174.0 lb

## 2019-02-24 DIAGNOSIS — M5010 Cervical disc disorder with radiculopathy, unspecified cervical region: Secondary | ICD-10-CM | POA: Diagnosis not present

## 2019-02-24 DIAGNOSIS — H6983 Other specified disorders of Eustachian tube, bilateral: Secondary | ICD-10-CM

## 2019-02-24 DIAGNOSIS — R69 Illness, unspecified: Secondary | ICD-10-CM | POA: Diagnosis not present

## 2019-02-24 DIAGNOSIS — E039 Hypothyroidism, unspecified: Secondary | ICD-10-CM

## 2019-02-24 MED ORDER — MONTELUKAST SODIUM 10 MG PO TABS: 10.0000 mg | ORAL_TABLET | Freq: Every day | ORAL | 3 refills | Status: DC

## 2019-02-24 NOTE — Progress Notes (Signed)
Date:  02/24/2019   Name:  [name]   DOB:  1965/08/31   MRN:  [id number]   Chief Complaint: Hypothyroidism and Neck Pain (refill meloxicam)  Neck Pain  This is a chronic problem. The current episode started more than 1 year ago. The problem occurs intermittently. The problem has been unchanged. Associated with: herniated disc for yoga. Pain location: bilateral. The quality of the pain is described as aching. The pain is at a severity of 5/10 (to 8). The pain is moderate. The symptoms are aggravated by twisting and bending. Pertinent negatives include no fever, headaches, numbness, pain with swallowing, paresis, photophobia, tingling, visual change, weakness or weight loss. She has tried NSAIDs (physical therapy) for the symptoms. The treatment provided mild relief.  Thyroid Problem Presents for follow-up visit. Symptoms include cold intolerance, constipation, dry skin, fatigue, hair loss, hoarse voice and weight gain. Patient reports no anxiety, depressed mood, diaphoresis, diarrhea, heat intolerance, leg swelling, menstrual problem, nail problem, palpitations, tremors, visual change or weight loss. Symptom course: seems not as controlled.  Otalgia  There is pain in both ears. This is a recurrent problem. The current episode started more than 1 month ago. The problem occurs constantly. The problem has been waxing and waning. There has been no fever. Associated symptoms include hearing loss and neck pain. Pertinent negatives include no abdominal pain, coughing, diarrhea, ear discharge, headaches, rash, rhinorrhea, sore throat or vomiting. Associated symptoms comments: tinnitis. The treatment provided mild relief.    Review of Systems  Constitutional: Positive for fatigue and weight gain. Negative for chills, diaphoresis, fever, unexpected weight change and weight loss.  HENT: Positive for ear pain, hearing loss and hoarse voice. Negative for congestion, ear discharge, rhinorrhea, sinus  pressure, sneezing and sore throat.   Eyes: Negative for photophobia, pain, discharge, redness and itching.  Respiratory: Negative for cough, shortness of breath, wheezing and stridor.   Cardiovascular: Negative for palpitations.  Gastrointestinal: Positive for constipation. Negative for abdominal pain, blood in stool, diarrhea, nausea and vomiting.  Endocrine: Positive for cold intolerance. Negative for heat intolerance, polydipsia, polyphagia and polyuria.  Genitourinary: Negative for dysuria, flank pain, frequency, hematuria, menstrual problem, pelvic pain, urgency, vaginal bleeding and vaginal discharge.  Musculoskeletal: Positive for neck pain. Negative for arthralgias, back pain and myalgias.  Skin: Negative for rash.  Allergic/Immunologic: Negative for environmental allergies and food allergies.  Neurological: Negative for dizziness, tingling, tremors, weakness, light-headedness, numbness and headaches.  Hematological: Negative for adenopathy. Does not bruise/bleed easily.  Psychiatric/Behavioral: Negative for dysphoric mood. The patient is not nervous/anxious.     Patient Active Problem List   Diagnosis Date Noted  . Cervical disc disorder with radiculopathy 10/23/2017  . Insomnia 08/24/2015  . Stress incontinence in female 08/24/2015  . FH: diabetes mellitus 08/24/2015  . S/P laparoscopic assisted vaginal hysterectomy (LAVH) 06/06/2015  . Rosacea 02/24/2015  . Hypothyroidism 01/26/2015  . Systolic murmur 99991111  . IBS (irritable bowel syndrome) 06/13/2011  . Hemorrhoid 06/04/2011    Allergies  Allergen Reactions  . Doxycycline Other (See Comments)    Thrush  . Ciprofloxacin Other (See Comments)    Yeast infection  . Sulfa Antibiotics Other (See Comments)    Yeast infections    Past Surgical History:  Procedure Laterality Date  . BREAST BIOPSY Left 1995   neg  . BREAST SURGERY  1994   left breast bx  . COLONOSCOPY W/ BIOPSIES  2013   divert. cleared for 5  yrs- [facility]  .  COLPOSCOPY  2002   ABNORMAL PAP  . CYSTOSCOPY  06/06/2015   Procedure: CYSTOSCOPY;  Surgeon: Osborne Oman, MD;  Location: [facility];  Service: Gynecology;;  . DILATION AND CURETTAGE OF UTERUS     TAB  . LAPAROSCOPIC VAGINAL HYSTERECTOMY WITH SALPINGO OOPHORECTOMY N/A 06/06/2015   Procedure: LAPAROSCOPIC ASSISTED VAGINAL HYSTERECTOMY ;  Surgeon: Osborne Oman, MD;  Location: [facility];  Service: Gynecology;  Laterality: N/A;  722.9grams   . LEEP  2002  . TUBAL LIGATION    . VAGINAL HYSTERECTOMY      Social History   Tobacco Use  . Smoking status: Never Smoker  . Smokeless tobacco: Never Used  Substance Use Topics  . Alcohol use: No    Alcohol/week: 0.0 standard drinks  . Drug use: No     Medication list has been reviewed and updated.  Current Meds  Medication Sig  . Acetaminophen (TYLENOL ARTHRITIS PAIN PO) Take 2 tablets by mouth daily as needed (pain).  . Black Cohosh-SoyIsoflav-Magnol (ESTROVEN MENOPAUSE RELIEF PO) Take 1 capsule daily by mouth. otc  . docusate sodium (COLACE) 250 MG capsule Take 250 mg by mouth daily.  Marland Kitchen levothyroxine (SYNTHROID) 50 MCG tablet Take 1 tablet (50 mcg total) by mouth daily.  Marland Kitchen loratadine (CLARITIN) 10 MG tablet Take 10 mg by mouth daily as needed for allergies. otc  . meloxicam (MOBIC) 15 MG tablet Take 1 tablet (15 mg total) by mouth daily.  Marland Kitchen METRONIDAZOLE, TOPICAL, 0.75 % LOTN Apply 1 application topically 2 (two) times daily. (Patient taking differently: Apply 1 application topically daily as needed. )  . Multiple Vitamins-Minerals (MULTIVITAMIN WITH MINERALS) tablet Take 1 tablet by mouth daily.  Marland Kitchen OVER THE COUNTER MEDICATION For sleep Camomile and Melatonin and another ingredient pt unsure of  . Probiotic Product (ALIGN) 4 MG CAPS Take 1 capsule by mouth daily.   Current Facility-Administered Medications for the 02/24/19 encounter (Office Visit) with Juline Patch, MD  Medication  . 0.9 %  sodium chloride infusion     PHQ 2/9 Scores 02/24/2019 01/14/2019 03/24/2017 08/24/2015  PHQ - 2 Score 0 1 0 0  PHQ- 9 Score 0 10 2 -    BP Readings from Last 3 Encounters:  02/24/19 102/70  01/14/19 134/82  08/24/18 124/80    Physical Exam Vitals signs and nursing note reviewed.  Constitutional:      General: She is not in acute distress.    Appearance: She is not diaphoretic.  HENT:     Head: Normocephalic and atraumatic.     Jaw: There is normal jaw occlusion.     Right Ear: Ear canal and external ear normal. Decreased hearing noted. There is impacted cerumen. Tympanic membrane is retracted.     Left Ear: Ear canal and external ear normal. Decreased hearing noted. There is impacted cerumen. Tympanic membrane is retracted.     Nose: Nose normal.  Eyes:     General:        Right eye: No discharge.        Left eye: No discharge.     Conjunctiva/sclera: Conjunctivae normal.     Pupils: Pupils are equal, round, and reactive to light.  Neck:     Musculoskeletal: Normal range of motion and neck supple.     Thyroid: No thyromegaly.     Vascular: No JVD.  Cardiovascular:     Rate and Rhythm: Normal rate and regular rhythm.     Heart sounds: Normal heart sounds. No murmur.  No friction rub. No gallop.   Pulmonary:     Effort: Pulmonary effort is normal.     Breath sounds: Normal breath sounds.  Abdominal:     General: Bowel sounds are normal.     Palpations: Abdomen is soft. There is no mass.     Tenderness: There is no abdominal tenderness. There is no guarding.  Musculoskeletal: Normal range of motion.  Lymphadenopathy:     Cervical: No cervical adenopathy.  Skin:    General: Skin is warm and dry.  Neurological:     Mental Status: She is alert.     Deep Tendon Reflexes: Reflexes are normal and symmetric.     Wt Readings from Last 3 Encounters:  02/24/19 174 lb (78.9 kg)  01/14/19 172 lb (78 kg)  08/24/18 171 lb (77.6 kg)    BP 102/70   Pulse 64   Ht 5\' 6"  (1.676 m)   Wt 174 lb (78.9 kg)    LMP 12/12/2014   BMI 28.08 kg/m   Assessment and Plan:  1. Cervical disc disorder with radiculopathy Patient continues to have neck pain in the 5-8 range pending activity.  This is exacerbated with almost any cervical movement.  Physical therapy has helped to a certain degree but has not resolved the problem.  Will refer to Dr. Sherren Mocha Monday for reevaluation at orthopedic surgery and the possibility of MRI/epidural injection. - Ambulatory referral to Orthopedic Surgery  2. Hypothyroidism, unspecified type Patient is not doing well with her symptoms of hypothyroid.  On review of her TSH this is been in normal range but patient still has all the symptoms of hypothyroid including fatigue, cold intolerance, hair and skin concerns.  She is currently on 50 mcg of levothyroxine.  Will refer to endocrinology for further evaluation and whether or not patient needs adjustment on her current regimen. - Thyroid Panel With TSH - Ambulatory referral to Endocrinology  3. Dysfunction of both eustachian tubes Patient continues to have fullness in both ears which is now associated with tinnitus and a decrease in her hearing.  Patient is on intranasal steroids as well as antihistamine and Sudafed.  We will add Singulair 10 mg once a day and await the first and hopefully therapeutic frost freeze.  If this does not resolve patient's symptoms we will refer to ear nose and throat. - montelukast (SINGULAIR) 10 MG tablet; Take 1 tablet (10 mg total) by mouth at bedtime.  Dispense: 30 tablet; Refill: 3  4. Taking medication for chronic disease Patient has multiple medications that could cause some concerns and we will check a renal function panel for electrolyte concerns. - Renal Function Panel

## 2019-02-25 LAB — RENAL FUNCTION PANEL
Albumin: 4.6 g/dL (ref 3.8–4.9)
BUN/Creatinine Ratio: 15 (ref 9–23)
BUN: 12 mg/dL (ref 6–24)
CO2: 26 mmol/L (ref 20–29)
Calcium: 9.7 mg/dL (ref 8.7–10.2)
Chloride: 99 mmol/L (ref 96–106)
Creatinine, Ser: 0.8 mg/dL (ref 0.57–1.00)
GFR calc Af Amer: 97 mL/min/{1.73_m2} (ref >59)
GFR calc non Af Amer: 84 mL/min/{1.73_m2} (ref >59)
Glucose: 109 mg/dL — ABNORMAL HIGH (ref 65–99)
Phosphorus: 3.8 mg/dL (ref 3.0–4.3)
Potassium: 4.1 mmol/L (ref 3.5–5.2)
Sodium: 140 mmol/L (ref 134–144)

## 2019-02-25 LAB — THYROID PANEL WITH TSH
Free Thyroxine Index: 2.7 (ref 1.2–4.9)
T3 Uptake Ratio: 25 % (ref 24–39)
T4, Total: 10.8 ug/dL (ref 4.5–12.0)
TSH: 4.2 u[IU]/mL (ref 0.450–4.500)

## 2019-02-26 DIAGNOSIS — M5030 Other cervical disc degeneration, unspecified cervical region: Secondary | ICD-10-CM | POA: Diagnosis not present

## 2019-02-26 DIAGNOSIS — M5412 Radiculopathy, cervical region: Secondary | ICD-10-CM | POA: Diagnosis not present

## 2019-03-03 ENCOUNTER — Other Ambulatory Visit: Payer: Self-pay | Admitting: Orthopedic Surgery

## 2019-03-03 DIAGNOSIS — M5030 Other cervical disc degeneration, unspecified cervical region: Secondary | ICD-10-CM

## 2019-03-03 DIAGNOSIS — M5412 Radiculopathy, cervical region: Secondary | ICD-10-CM

## 2019-03-20 ENCOUNTER — Ambulatory Visit
Admission: RE | Admit: 2019-03-20 | Discharge: 2019-03-20 | Disposition: A | Discharge status: Home / Self Care | Payer: 59 | Source: Ambulatory Visit | Attending: Orthopedic Surgery | Admitting: Orthopedic Surgery

## 2019-03-20 ENCOUNTER — Other Ambulatory Visit: Payer: Self-pay

## 2019-03-20 DIAGNOSIS — M4802 Spinal stenosis, cervical region: Secondary | ICD-10-CM | POA: Diagnosis not present

## 2019-03-20 DIAGNOSIS — M5412 Radiculopathy, cervical region: Secondary | ICD-10-CM

## 2019-03-20 DIAGNOSIS — M5030 Other cervical disc degeneration, unspecified cervical region: Secondary | ICD-10-CM

## 2019-03-23 DIAGNOSIS — M5030 Other cervical disc degeneration, unspecified cervical region: Secondary | ICD-10-CM | POA: Diagnosis not present

## 2019-03-23 DIAGNOSIS — M5412 Radiculopathy, cervical region: Secondary | ICD-10-CM | POA: Diagnosis not present

## 2019-04-01 DIAGNOSIS — E063 Autoimmune thyroiditis: Secondary | ICD-10-CM | POA: Diagnosis not present

## 2019-04-07 ENCOUNTER — Encounter: Payer: Self-pay | Admitting: Family Medicine

## 2019-04-07 ENCOUNTER — Ambulatory Visit (INDEPENDENT_AMBULATORY_CARE_PROVIDER_SITE_OTHER): Payer: BC Managed Care – PPO | Admitting: Family Medicine

## 2019-04-07 ENCOUNTER — Other Ambulatory Visit (HOSPITAL_COMMUNITY)
Admission: RE | Admit: 2019-04-07 | Discharge: 2019-04-07 | Disposition: A | Discharge status: Home / Self Care | Payer: BC Managed Care – PPO | Source: Ambulatory Visit | Attending: Family Medicine | Admitting: Family Medicine

## 2019-04-07 ENCOUNTER — Other Ambulatory Visit: Payer: Self-pay

## 2019-04-07 VITALS — BP 110/62 | HR 64 | Ht 66.0 in | Wt 175.0 lb

## 2019-04-07 DIAGNOSIS — Z1211 Encounter for screening for malignant neoplasm of colon: Secondary | ICD-10-CM

## 2019-04-07 DIAGNOSIS — Z0000 Encounter for general adult medical examination without abnormal findings: Secondary | ICD-10-CM

## 2019-04-07 DIAGNOSIS — Z01419 Encounter for gynecological examination (general) (routine) without abnormal findings: Secondary | ICD-10-CM

## 2019-04-07 DIAGNOSIS — Z1272 Encounter for screening for malignant neoplasm of vagina: Secondary | ICD-10-CM | POA: Diagnosis not present

## 2019-04-07 LAB — HEMOCCULT GUIAC POC 1CARD (OFFICE): Fecal Occult Blood, POC: NEGATIVE

## 2019-04-07 NOTE — Progress Notes (Signed)
Date:  04/07/2019   Name:  [name]   DOB:  04-Mar-1966   MRN:  [id number]   Chief Complaint: Annual Exam (pap and breast exam)  Patient is a 53 year old female who presents for a comprehensive physical exam. The patient reports the following problems: none/neck pain. Health maintenance has been reviewed needs mammogram.   Lab Results  Component Value Date   CREATININE 0.80 02/24/2019   BUN 12 02/24/2019   NA 140 02/24/2019   K 4.1 02/24/2019   CL 99 02/24/2019   CO2 26 02/24/2019   Lab Results  Component Value Date   CHOL 174 04/02/2018   HDL 52 04/02/2018   LDLCALC 101 (H) 04/02/2018   TRIG 106 04/02/2018   CHOLHDL 3.3 04/02/2018   Lab Results  Component Value Date   TSH 4.200 02/24/2019   Lab Results  Component Value Date   HGBA1C 5.4 08/24/2015     Review of Systems  Patient Active Problem List   Diagnosis Date Noted  . Cervical disc disorder with radiculopathy 10/23/2017  . Insomnia 08/24/2015  . Stress incontinence in female 08/24/2015  . FH: diabetes mellitus 08/24/2015  . S/P laparoscopic assisted vaginal hysterectomy (LAVH) 06/06/2015  . Rosacea 02/24/2015  . Hypothyroidism 01/26/2015  . Systolic murmur 99991111  . IBS (irritable bowel syndrome) 06/13/2011  . Hemorrhoid 06/04/2011    Allergies  Allergen Reactions  . Doxycycline Other (See Comments)    Thrush  . Ciprofloxacin Other (See Comments)    Yeast infection  . Sulfa Antibiotics Other (See Comments)    Yeast infections    Past Surgical History:  Procedure Laterality Date  . BREAST BIOPSY Left 1995   neg  . BREAST SURGERY  1994   left breast bx  . COLONOSCOPY W/ BIOPSIES  2013   divert. cleared for 5 yrs- [facility]  . COLPOSCOPY  2002   ABNORMAL PAP  . CYSTOSCOPY  06/06/2015   Procedure: CYSTOSCOPY;  Surgeon: Osborne Oman, MD;  Location: [facility];  Service: Gynecology;;  . DILATION AND CURETTAGE OF UTERUS     TAB  . LAPAROSCOPIC VAGINAL HYSTERECTOMY WITH SALPINGO  OOPHORECTOMY N/A 06/06/2015   Procedure: LAPAROSCOPIC ASSISTED VAGINAL HYSTERECTOMY ;  Surgeon: Osborne Oman, MD;  Location: [facility];  Service: Gynecology;  Laterality: N/A;  722.9grams   . LEEP  2002  . TUBAL LIGATION    . VAGINAL HYSTERECTOMY      Social History   Tobacco Use  . Smoking status: Never Smoker  . Smokeless tobacco: Never Used  Substance Use Topics  . Alcohol use: No    Alcohol/week: 0.0 standard drinks  . Drug use: No     Medication list has been reviewed and updated.  Current Meds  Medication Sig  . Acetaminophen (TYLENOL ARTHRITIS PAIN PO) Take 2 tablets by mouth daily as needed (pain).  . Black Cohosh-SoyIsoflav-Magnol (ESTROVEN MENOPAUSE RELIEF PO) Take 1 capsule daily by mouth. otc  . docusate sodium (COLACE) 250 MG capsule Take 250 mg by mouth daily.  Marland Kitchen loratadine (CLARITIN) 10 MG tablet Take 10 mg by mouth daily as needed for allergies. otc  . meloxicam (MOBIC) 15 MG tablet Take 1 tablet (15 mg total) by mouth daily.  Marland Kitchen METRONIDAZOLE, TOPICAL, 0.75 % LOTN Apply 1 application topically 2 (two) times daily. (Patient taking differently: Apply 1 application topically daily as needed. )  . montelukast (SINGULAIR) 10 MG tablet Take 1 tablet (10 mg total) by mouth at bedtime.  Marland Kitchen  Multiple Vitamins-Minerals (MULTIVITAMIN WITH MINERALS) tablet Take 1 tablet by mouth daily.  Marland Kitchen OVER THE COUNTER MEDICATION For sleep Camomile and Melatonin and another ingredient pt unsure of  . Probiotic Product (ALIGN) 4 MG CAPS Take 1 capsule by mouth daily.  Marland Kitchen thyroid (ARMOUR) 60 MG tablet Take 1 tablet by mouth daily. [employer]  . tiZANidine (ZANAFLEX) 2 MG tablet Take 1 tablet by mouth at bedtime. Reche Dixon  . [DISCONTINUED] levothyroxine (SYNTHROID) 50 MCG tablet Take 1 tablet (50 mcg total) by mouth daily.   Current Facility-Administered Medications for the 04/07/19 encounter (Office Visit) with Juline Patch, MD  Medication  . 0.9 %  sodium chloride infusion     PHQ 2/9 Scores 02/24/2019 01/14/2019 03/24/2017 08/24/2015  PHQ - 2 Score 0 1 0 0  PHQ- 9 Score 0 10 2 -    BP Readings from Last 3 Encounters:  04/07/19 110/62  02/24/19 102/70  01/14/19 134/82    Physical Exam Vitals signs and nursing note reviewed. Exam conducted with a chaperone present.  Constitutional:      General: She is not in acute distress.    Appearance: Normal appearance. She is well-groomed and normal weight. She is not diaphoretic.  HENT:     Head: Normocephalic and atraumatic.     Jaw: There is normal jaw occlusion.     Right Ear: Hearing, tympanic membrane, ear canal and external ear normal.     Left Ear: Hearing, tympanic membrane, ear canal and external ear normal.     Nose: Nose normal.     Right Turbinates: Not enlarged.     Mouth/Throat:     Lips: Pink.     Mouth: Mucous membranes are moist.     Dentition: Normal dentition.     Tongue: No lesions.  Eyes:     General: Lids are normal. Vision grossly intact. Gaze aligned appropriately.        Right eye: No discharge.        Left eye: No discharge.     Extraocular Movements: Extraocular movements intact.     Conjunctiva/sclera: Conjunctivae normal.     Pupils: Pupils are equal, round, and reactive to light.     Funduscopic exam:    Right eye: Red reflex present.        Left eye: Red reflex present. Neck:     Musculoskeletal: Full passive range of motion without pain, normal range of motion and neck supple.     Thyroid: No thyroid mass, thyromegaly or thyroid tenderness.     Vascular: No JVD.  Cardiovascular:     Rate and Rhythm: Normal rate and regular rhythm.     Chest Wall: PMI is not displaced.     Pulses: Normal pulses.          Carotid pulses are 2+ on the right side and 2+ on the left side.      Radial pulses are 2+ on the right side and 2+ on the left side.       Femoral pulses are 2+ on the right side and 2+ on the left side.      Popliteal pulses are 2+ on the right side and 2+ on the left  side.       Dorsalis pedis pulses are 2+ on the right side and 2+ on the left side.       Posterior tibial pulses are 2+ on the right side and 2+ on the left side.  Heart sounds: Normal heart sounds, S1 normal and S2 normal. No murmur. No systolic murmur. No diastolic murmur. No friction rub. No gallop. No S3 or S4 sounds.   Pulmonary:     Effort: Pulmonary effort is normal.     Breath sounds: Normal breath sounds. No decreased air movement. No decreased breath sounds, wheezing, rhonchi or rales.  Chest:     Breasts:        Right: Normal. No swelling, bleeding, inverted nipple, mass, nipple discharge, skin change or tenderness.        Left: Normal. No swelling, bleeding, inverted nipple, mass, nipple discharge, skin change or tenderness.  Abdominal:     General: Bowel sounds are normal.     Palpations: Abdomen is soft. There is no hepatomegaly, splenomegaly or mass.     Tenderness: There is no abdominal tenderness. There is no guarding.  Genitourinary:    General: Normal vulva.     Exam position: Lithotomy position.     Labia:        Right: No rash.        Left: No rash.      Urethra: No prolapse or urethral pain.     Vagina: Normal.     Adnexa: Right adnexa normal and left adnexa normal.     Rectum: Normal. Guaiac result negative. No mass.  Musculoskeletal: Normal range of motion.     Cervical back: Normal.     Thoracic back: Normal.     Lumbar back: Normal.     Right lower leg: No edema.     Left lower leg: No edema.  Lymphadenopathy:     Head:     Right side of head: No submandibular adenopathy.     Left side of head: No submandibular adenopathy.     Cervical: No cervical adenopathy.     Right cervical: No superficial, deep or posterior cervical adenopathy.    Left cervical: No superficial, deep or posterior cervical adenopathy.     Upper Body:     Right upper body: No supraclavicular or axillary adenopathy.     Left upper body: No supraclavicular or axillary adenopathy.      Lower Body: No right inguinal adenopathy. No left inguinal adenopathy.  Skin:    General: Skin is warm and dry.     Capillary Refill: Capillary refill takes less than 2 seconds.     Coloration: Skin is not ashen.  Neurological:     Mental Status: She is alert and oriented to person, place, and time.     Cranial Nerves: Cranial nerves are intact.     Sensory: Sensation is intact.     Motor: Motor function is intact.     Deep Tendon Reflexes: Reflexes are normal and symmetric.  Psychiatric:        Attention and Perception: Attention normal.        Mood and Affect: Mood normal.        Speech: Speech normal.        Behavior: Behavior is cooperative.        Cognition and Memory: Cognition and memory normal.     Wt Readings from Last 3 Encounters:  04/07/19 175 lb (79.4 kg)  02/24/19 174 lb (78.9 kg)  01/14/19 172 lb (78 kg)    BP 110/62   Pulse 64   Ht 5\' 6"  (1.676 m)   Wt 175 lb (79.4 kg)   LMP 12/12/2014   BMI 28.25 kg/m   Assessment and Plan: 1.  Annual physical exam No subjective/objective concerns noted during history and physical exam.  Patient's previous encounters, labs, imaging was reviewed.[name] is a 53 y.o. female who presents today for her Complete Annual Exam. She feels well. She reports exercising . She reports she is sleeping well. Immunizations are reviewed and recommendations provided.   Age appropriate screening tests are discussed. Counseling given for risk factor reduction interventions.  We will obtain a CBC and lipid panel because previously labs were noted to be within reason range other than these. - CBC with Differential/Platelet - Lipid Panel With LDL/HDL Ratio - POCT Occult Blood Stool  2. Encounter for Papanicolaou smear of vagina as part of routine gynecological examination Patient has a history of abnormal Paps but has had a hysterectomy.  Bimanual exam was noted to be normal today.  Breast exam was normal today.  We will obtain a  cytology Pap exam. - Cytology - PAP  3. Colon cancer screening Colon cancer screening with guaiac was noted to be negative review of colonoscopy was up-to-date. - POCT Occult Blood Stool

## 2019-04-08 LAB — CBC WITH DIFFERENTIAL/PLATELET
Basophils Absolute: 0.1 10*3/uL (ref 0.0–0.2)
Basos: 1 %
EOS (ABSOLUTE): 0.4 10*3/uL (ref 0.0–0.4)
Eos: 6 %
Hematocrit: 39 % (ref 34.0–46.6)
Hemoglobin: 13.6 g/dL (ref 11.1–15.9)
Immature Grans (Abs): 0 10*3/uL (ref 0.0–0.1)
Immature Granulocytes: 0 %
Lymphocytes Absolute: 1.2 10*3/uL (ref 0.7–3.1)
Lymphs: 19 %
MCH: 32.2 pg (ref 26.6–33.0)
MCHC: 34.9 g/dL (ref 31.5–35.7)
MCV: 92 fL (ref 79–97)
Monocytes Absolute: 0.4 10*3/uL (ref 0.1–0.9)
Monocytes: 6 %
Neutrophils Absolute: 4.3 10*3/uL (ref 1.4–7.0)
Neutrophils: 68 %
Platelets: 179 10*3/uL (ref 150–450)
RBC: 4.22 x10E6/uL (ref 3.77–5.28)
RDW: 12.6 % (ref 11.7–15.4)
WBC: 6.4 10*3/uL (ref 3.4–10.8)

## 2019-04-08 LAB — LIPID PANEL WITH LDL/HDL RATIO
Cholesterol, Total: 169 mg/dL (ref 100–199)
HDL: 48 mg/dL (ref >39)
LDL Chol Calc (NIH): 98 mg/dL (ref 0–99)
LDL/HDL Ratio: 2 ratio (ref 0.0–3.2)
Triglycerides: 129 mg/dL (ref 0–149)
VLDL Cholesterol Cal: 23 mg/dL (ref 5–40)

## 2019-04-13 DIAGNOSIS — M5412 Radiculopathy, cervical region: Secondary | ICD-10-CM | POA: Diagnosis not present

## 2019-04-13 DIAGNOSIS — M5030 Other cervical disc degeneration, unspecified cervical region: Secondary | ICD-10-CM | POA: Diagnosis not present

## 2019-04-13 LAB — CYTOLOGY - PAP
Comment: NEGATIVE
Diagnosis: NEGATIVE
High risk HPV: NEGATIVE

## 2019-04-26 DIAGNOSIS — Z20828 Contact with and (suspected) exposure to other viral communicable diseases: Secondary | ICD-10-CM | POA: Diagnosis not present

## 2019-04-30 DIAGNOSIS — Z20828 Contact with and (suspected) exposure to other viral communicable diseases: Secondary | ICD-10-CM | POA: Diagnosis not present

## 2019-05-13 DIAGNOSIS — E063 Autoimmune thyroiditis: Secondary | ICD-10-CM | POA: Diagnosis not present

## 2019-05-27 DIAGNOSIS — M47812 Spondylosis without myelopathy or radiculopathy, cervical region: Secondary | ICD-10-CM | POA: Diagnosis not present

## 2019-06-15 DIAGNOSIS — M47812 Spondylosis without myelopathy or radiculopathy, cervical region: Secondary | ICD-10-CM | POA: Diagnosis not present

## 2019-06-23 DIAGNOSIS — M47812 Spondylosis without myelopathy or radiculopathy, cervical region: Secondary | ICD-10-CM | POA: Diagnosis not present

## 2019-06-23 DIAGNOSIS — M5030 Other cervical disc degeneration, unspecified cervical region: Secondary | ICD-10-CM | POA: Diagnosis not present

## 2019-06-23 DIAGNOSIS — M5412 Radiculopathy, cervical region: Secondary | ICD-10-CM | POA: Diagnosis not present

## 2019-07-02 DIAGNOSIS — E063 Autoimmune thyroiditis: Secondary | ICD-10-CM | POA: Diagnosis not present

## 2019-07-09 DIAGNOSIS — E063 Autoimmune thyroiditis: Secondary | ICD-10-CM | POA: Diagnosis not present

## 2019-07-28 DIAGNOSIS — M47812 Spondylosis without myelopathy or radiculopathy, cervical region: Secondary | ICD-10-CM | POA: Diagnosis not present

## 2019-09-09 DIAGNOSIS — M5030 Other cervical disc degeneration, unspecified cervical region: Secondary | ICD-10-CM | POA: Diagnosis not present

## 2019-09-09 DIAGNOSIS — M47812 Spondylosis without myelopathy or radiculopathy, cervical region: Secondary | ICD-10-CM | POA: Diagnosis not present

## 2019-09-09 DIAGNOSIS — M5412 Radiculopathy, cervical region: Secondary | ICD-10-CM | POA: Diagnosis not present

## 2019-09-20 DIAGNOSIS — M6281 Muscle weakness (generalized): Secondary | ICD-10-CM | POA: Diagnosis not present

## 2019-09-20 DIAGNOSIS — M542 Cervicalgia: Secondary | ICD-10-CM | POA: Diagnosis not present

## 2019-09-20 DIAGNOSIS — M5030 Other cervical disc degeneration, unspecified cervical region: Secondary | ICD-10-CM | POA: Diagnosis not present

## 2019-09-23 DIAGNOSIS — M5030 Other cervical disc degeneration, unspecified cervical region: Secondary | ICD-10-CM | POA: Diagnosis not present

## 2019-09-23 DIAGNOSIS — M6281 Muscle weakness (generalized): Secondary | ICD-10-CM | POA: Diagnosis not present

## 2019-09-23 DIAGNOSIS — M542 Cervicalgia: Secondary | ICD-10-CM | POA: Diagnosis not present

## 2019-09-27 DIAGNOSIS — M542 Cervicalgia: Secondary | ICD-10-CM | POA: Diagnosis not present

## 2019-09-27 DIAGNOSIS — M6281 Muscle weakness (generalized): Secondary | ICD-10-CM | POA: Diagnosis not present

## 2019-09-30 DIAGNOSIS — M542 Cervicalgia: Secondary | ICD-10-CM | POA: Diagnosis not present

## 2019-09-30 DIAGNOSIS — M5030 Other cervical disc degeneration, unspecified cervical region: Secondary | ICD-10-CM | POA: Diagnosis not present

## 2019-09-30 DIAGNOSIS — M6281 Muscle weakness (generalized): Secondary | ICD-10-CM | POA: Diagnosis not present

## 2019-10-01 ENCOUNTER — Encounter: Payer: Self-pay | Admitting: Internal Medicine

## 2019-10-04 DIAGNOSIS — M542 Cervicalgia: Secondary | ICD-10-CM | POA: Diagnosis not present

## 2019-10-04 DIAGNOSIS — M5030 Other cervical disc degeneration, unspecified cervical region: Secondary | ICD-10-CM | POA: Diagnosis not present

## 2019-10-04 DIAGNOSIS — M6281 Muscle weakness (generalized): Secondary | ICD-10-CM | POA: Diagnosis not present

## 2019-10-07 DIAGNOSIS — M5030 Other cervical disc degeneration, unspecified cervical region: Secondary | ICD-10-CM | POA: Diagnosis not present

## 2019-10-07 DIAGNOSIS — M6281 Muscle weakness (generalized): Secondary | ICD-10-CM | POA: Diagnosis not present

## 2019-10-07 DIAGNOSIS — M542 Cervicalgia: Secondary | ICD-10-CM | POA: Diagnosis not present

## 2019-10-13 ENCOUNTER — Ambulatory Visit (AMBULATORY_SURGERY_CENTER): Payer: Self-pay | Admitting: *Deleted

## 2019-10-13 ENCOUNTER — Other Ambulatory Visit: Payer: Self-pay

## 2019-10-13 VITALS — Ht 66.0 in | Wt 171.0 lb

## 2019-10-13 DIAGNOSIS — Z86010 Personal history of colonic polyps: Secondary | ICD-10-CM

## 2019-10-13 MED ORDER — SUTAB 1479-225-188 MG PO TABS: 24.0000 | ORAL_TABLET | ORAL | 0 refills | Status: DC

## 2019-10-13 NOTE — Progress Notes (Signed)
sutab code to pharmacy, coupon to pt  No egg or soy allergy known to patient  No issues with past sedation with any surgeries  or procedures, no intubation problems- pt states she feels like its hard to wake post op   No diet pills per patient No home 02 use per patient  No blood thinners per patient   Pt states issues with constipation - she has 1 BM every 3-4 days- hard stools -last colon Suprep Good- pt states she has always had issues with constipation - will do a 2 day sutab for her colon   No A fib or A flutter  EMMI video sent to pt's e mail   Due to the COVID-19 pandemic we are asking patients to follow these guidelines. Please only bring one care partner. Please be aware that your care partner may wait in the car in the parking lot or if they feel like they will be too hot to wait in the car, they may wait in the lobby on the 4th floor. All care partners are required to wear a mask the entire time (we do not have any that we can provide them), they need to practice social distancing, and we will do a Covid check for all patient's and care partners when you arrive. Also we will check their temperature and your temperature. If the care partner waits in their car they need to stay in the parking lot the entire time and we will call them on their cell phone when the patient is ready for discharge so they can bring the car to the front of the building. Also all patient's will need to wear a mask into building.

## 2019-10-22 ENCOUNTER — Encounter: Payer: Self-pay | Admitting: Internal Medicine

## 2019-10-27 DIAGNOSIS — M530 Cervicocranial syndrome: Secondary | ICD-10-CM | POA: Diagnosis not present

## 2019-10-27 DIAGNOSIS — M9902 Segmental and somatic dysfunction of thoracic region: Secondary | ICD-10-CM | POA: Diagnosis not present

## 2019-10-27 DIAGNOSIS — M531 Cervicobrachial syndrome: Secondary | ICD-10-CM | POA: Diagnosis not present

## 2019-10-27 DIAGNOSIS — M9901 Segmental and somatic dysfunction of cervical region: Secondary | ICD-10-CM | POA: Diagnosis not present

## 2019-10-29 DIAGNOSIS — M530 Cervicocranial syndrome: Secondary | ICD-10-CM | POA: Diagnosis not present

## 2019-10-29 DIAGNOSIS — M531 Cervicobrachial syndrome: Secondary | ICD-10-CM | POA: Diagnosis not present

## 2019-10-29 DIAGNOSIS — M9902 Segmental and somatic dysfunction of thoracic region: Secondary | ICD-10-CM | POA: Diagnosis not present

## 2019-10-29 DIAGNOSIS — M9901 Segmental and somatic dysfunction of cervical region: Secondary | ICD-10-CM | POA: Diagnosis not present

## 2019-11-02 ENCOUNTER — Ambulatory Visit (AMBULATORY_SURGERY_CENTER): Payer: BC Managed Care – PPO | Admitting: Internal Medicine

## 2019-11-02 ENCOUNTER — Encounter: Payer: Self-pay | Admitting: Internal Medicine

## 2019-11-02 ENCOUNTER — Other Ambulatory Visit: Payer: Self-pay

## 2019-11-02 VITALS — BP 114/76 | HR 63 | Temp 98.0°F | Resp 14 | Ht 66.0 in | Wt 171.0 lb

## 2019-11-02 DIAGNOSIS — K635 Polyp of colon: Secondary | ICD-10-CM | POA: Diagnosis not present

## 2019-11-02 DIAGNOSIS — D125 Benign neoplasm of sigmoid colon: Secondary | ICD-10-CM

## 2019-11-02 DIAGNOSIS — Z1211 Encounter for screening for malignant neoplasm of colon: Secondary | ICD-10-CM | POA: Diagnosis not present

## 2019-11-02 DIAGNOSIS — Z86010 Personal history of colonic polyps: Secondary | ICD-10-CM

## 2019-11-02 MED ORDER — SODIUM CHLORIDE 0.9 % IV SOLN: 500.0000 mL | Freq: Once | INTRAVENOUS | Status: DC

## 2019-11-02 NOTE — Patient Instructions (Addendum)
Please read all of the handouts given to you by your recovery room nurse.  Thank-you for choosing [place] for your healthcare needs today.  YOU HAD AN ENDOSCOPIC PROCEDURE TODAY AT [facility]:   Refer to the procedure report that was given to you for any specific questions about what was found during the examination.  If the procedure report does not answer your questions, please call your gastroenterologist to clarify.  If you requested that your care partner not be given the details of your procedure findings, then the procedure report has been included in a sealed envelope for you to review at your convenience later.  YOU SHOULD EXPECT: Some feelings of bloating in the abdomen. Passage of more gas than usual.  Walking can help get rid of the air that was put into your GI tract during the procedure and reduce the bloating. If you had a lower endoscopy (such as a colonoscopy or flexible sigmoidoscopy) you may notice spotting of blood in your stool or on the toilet paper. If you underwent a bowel prep for your procedure, you may not have a normal bowel movement for a few days.  Please Note:  You might notice some irritation and congestion in your nose or some drainage.  This is from the oxygen used during your procedure.  There is no need for concern and it should clear up in a day or so.  SYMPTOMS TO REPORT IMMEDIATELY:   Following lower endoscopy (colonoscopy or flexible sigmoidoscopy):  Excessive amounts of blood in the stool  Significant tenderness or worsening of abdominal pains  Swelling of the abdomen that is new, acute  Fever of 100F or higher   For urgent or emergent issues, a gastroenterologist can be reached at any hour by calling [phone]. Do not use MyChart messaging for urgent concerns.    DIET:  We do recommend a small meal at first, but then you may proceed to your regular diet.  Drink plenty of fluids but you should avoid alcoholic beverages for 24  hours. Try to increase the fiber in your diet, and rink plenty of water.  ACTIVITY:  You should plan to take it easy for the rest of today and you should NOT DRIVE or use heavy machinery until tomorrow (because of the sedation medicines used during the test).    FOLLOW UP: Our staff will call the number listed on your records 48-72 hours following your procedure to check on you and address any questions or concerns that you may have regarding the information given to you following your procedure. If we do not reach you, we will leave a message.  We will attempt to reach you two times.  During this call, we will ask if you have developed any symptoms of COVID 19. If you develop any symptoms (ie: fever, flu-like symptoms, shortness of breath, cough etc.) before then, please call [phone].  If you test positive for Covid 19 in the 2 weeks post procedure, please call and report this information to [place].    If any biopsies were taken you will be contacted by phone or by letter within the next 1-3 weeks.  Please call us at [phone] if you have not heard about the biopsies in 3 weeks.    SIGNATURES/CONFIDENTIALITY: You and/or your care partner have signed paperwork which will be entered into your electronic medical record.  These signatures attest to the fact that that the information above on your After Visit Summary has been  reviewed and is understood.  Full responsibility of the confidentiality of this discharge information lies with you and/or your care-partner. 

## 2019-11-02 NOTE — Progress Notes (Signed)
Pt's states no medical or surgical changes since previsit or office visit.  V/S-CW  CHECK-IN-MZ

## 2019-11-02 NOTE — Op Note (Signed)
[facility] Patient Name: [name] Procedure Date: 11/02/2019 2:45 PM MRN: [id number] Endoscopist: Jerene Bears , MD Age: 54 Referring MD:  Date of Birth: 24-Oct-1965 Gender: Female Account #: [id number] Procedure:                Colonoscopy Indications:              High risk colon cancer surveillance: Personal                            history of sessile serrated colon polyps and                            tubular adenomas, Last colonoscopy: May 2018.                            Sessile serrated polyp without dysplasia 2013.                            Sessile serrated polyp with dysplasia, sessile                            serrated polyp without dysplasia and tubular                            adenoma at colonoscopy in 2018. Medicines:                Monitored Anesthesia Care Procedure:                Pre-Anesthesia Assessment:                           - Prior to the procedure, a History and Physical                            was performed, and patient medications and                            allergies were reviewed. The patient's tolerance of                            previous anesthesia was also reviewed. The risks                            and benefits of the procedure and the sedation                            options and risks were discussed with the patient.                            All questions were answered, and informed consent                            was obtained. Prior Anticoagulants: The patient has  taken no previous anticoagulant or antiplatelet                            agents. ASA Grade Assessment: II - A patient with                            mild systemic disease. After reviewing the risks                            and benefits, the patient was deemed in                            satisfactory condition to undergo the procedure.                           After obtaining informed consent, the colonoscope                             was passed under direct vision. Throughout the                            procedure, the patient's blood pressure, pulse, and                            oxygen saturations were monitored continuously. The                            Colonoscope was introduced through the anus and                            advanced to the cecum, identified by appendiceal                            orifice and ileocecal valve. The colonoscopy was                            performed without difficulty. The patient tolerated                            the procedure well. The quality of the bowel                            preparation was excellent. The ileocecal valve,                            appendiceal orifice, and rectum were photographed.                            The bowel preparation used was 2-day MiraLAX plus                            SuTab via split dose instruction. Scope In: 2:56:38 PM Scope Out: 3:11:58 PM Scope Withdrawal Time: 0 hours 11 minutes 56 seconds  Total Procedure  Duration: 0 hours 15 minutes 20 seconds  Findings:                 The perianal and digital rectal examinations were                            normal.                           A 4 mm polyp was found in the sigmoid colon. The                            polyp was sessile. The polyp was removed with a                            cold snare. Resection and retrieval were complete.                           Multiple small and large-mouthed diverticula were                            found in the sigmoid colon.                           The retroflexed view of the distal rectum and anal                            verge was normal and showed no anal or rectal                            abnormalities. Complications:            No immediate complications. Estimated Blood Loss:     Estimated blood loss was minimal. Impression:               - One 4 mm polyp in the sigmoid colon, removed with                             a cold snare. Resected and retrieved.                           - Diverticulosis in the sigmoid colon.                           - The distal rectum and anal verge are normal on                            retroflexion view. Recommendation:           - Patient has a contact number available for                            emergencies. The signs and symptoms of potential                            delayed complications were discussed with the  patient. Return to normal activities tomorrow.                            Written discharge instructions were provided to the                            patient.                           - Resume previous diet.                           - Continue present medications.                           - Await pathology results.                           - Repeat colonoscopy in 5 years for surveillance. Jerene Bears, MD 11/02/2019 3:15:32 PM This report has been signed electronically.

## 2019-11-02 NOTE — Progress Notes (Signed)
Report to PACU, RN, vss, BBS= Clear.  

## 2019-11-02 NOTE — Progress Notes (Signed)
Called to room to assist during endoscopic procedure.  Patient ID and intended procedure confirmed with present staff. Received instructions for my participation in the procedure from the performing physician.  

## 2019-11-03 DIAGNOSIS — M530 Cervicocranial syndrome: Secondary | ICD-10-CM | POA: Diagnosis not present

## 2019-11-03 DIAGNOSIS — M9902 Segmental and somatic dysfunction of thoracic region: Secondary | ICD-10-CM | POA: Diagnosis not present

## 2019-11-03 DIAGNOSIS — M531 Cervicobrachial syndrome: Secondary | ICD-10-CM | POA: Diagnosis not present

## 2019-11-03 DIAGNOSIS — M9901 Segmental and somatic dysfunction of cervical region: Secondary | ICD-10-CM | POA: Diagnosis not present

## 2019-11-04 ENCOUNTER — Telehealth: Payer: Self-pay | Admitting: *Deleted

## 2019-11-04 DIAGNOSIS — M47812 Spondylosis without myelopathy or radiculopathy, cervical region: Secondary | ICD-10-CM | POA: Diagnosis not present

## 2019-11-04 DIAGNOSIS — M62838 Other muscle spasm: Secondary | ICD-10-CM | POA: Diagnosis not present

## 2019-11-04 DIAGNOSIS — M5412 Radiculopathy, cervical region: Secondary | ICD-10-CM | POA: Diagnosis not present

## 2019-11-04 DIAGNOSIS — M5030 Other cervical disc degeneration, unspecified cervical region: Secondary | ICD-10-CM | POA: Diagnosis not present

## 2019-11-04 NOTE — Telephone Encounter (Signed)
  Follow up Call-  Call back number 11/02/2019  Post procedure Call Back phone  # [phone]  Permission to leave phone message Yes  Some recent data might be hidden     Patient questions:  Do you have a fever, pain , or abdominal swelling? No. Pain Score  0 *  Have you tolerated food without any problems? Yes.    Have you been able to return to your normal activities? Yes.    Do you have any questions about your discharge instructions: Diet   No. Medications  No. Follow up visit  No.  Do you have questions or concerns about your Care? No.  Actions: * If pain score is 4 or above: No action needed, pain <4.  1. Have you developed a fever since your procedure? no  2.   Have you had an respiratory symptoms (SOB or cough) since your procedure? no  3.   Have you tested positive for COVID 19 since your procedure no  4.   Have you had any family members/close contacts diagnosed with the COVID 19 since your procedure?  no   If yes to any of these questions please route to Joylene John, RN and Erenest Rasher, RN

## 2019-11-05 ENCOUNTER — Encounter: Payer: Self-pay | Admitting: Internal Medicine

## 2019-11-09 DIAGNOSIS — M530 Cervicocranial syndrome: Secondary | ICD-10-CM | POA: Diagnosis not present

## 2019-11-09 DIAGNOSIS — M9901 Segmental and somatic dysfunction of cervical region: Secondary | ICD-10-CM | POA: Diagnosis not present

## 2019-11-09 DIAGNOSIS — M531 Cervicobrachial syndrome: Secondary | ICD-10-CM | POA: Diagnosis not present

## 2019-11-09 DIAGNOSIS — M9902 Segmental and somatic dysfunction of thoracic region: Secondary | ICD-10-CM | POA: Diagnosis not present

## 2019-11-11 DIAGNOSIS — M530 Cervicocranial syndrome: Secondary | ICD-10-CM | POA: Diagnosis not present

## 2019-11-11 DIAGNOSIS — M9901 Segmental and somatic dysfunction of cervical region: Secondary | ICD-10-CM | POA: Diagnosis not present

## 2019-11-11 DIAGNOSIS — M531 Cervicobrachial syndrome: Secondary | ICD-10-CM | POA: Diagnosis not present

## 2019-11-11 DIAGNOSIS — M9902 Segmental and somatic dysfunction of thoracic region: Secondary | ICD-10-CM | POA: Diagnosis not present

## 2019-11-23 DIAGNOSIS — M530 Cervicocranial syndrome: Secondary | ICD-10-CM | POA: Diagnosis not present

## 2019-11-23 DIAGNOSIS — M9902 Segmental and somatic dysfunction of thoracic region: Secondary | ICD-10-CM | POA: Diagnosis not present

## 2019-11-23 DIAGNOSIS — M531 Cervicobrachial syndrome: Secondary | ICD-10-CM | POA: Diagnosis not present

## 2019-11-23 DIAGNOSIS — M9901 Segmental and somatic dysfunction of cervical region: Secondary | ICD-10-CM | POA: Diagnosis not present

## 2019-11-25 DIAGNOSIS — M9902 Segmental and somatic dysfunction of thoracic region: Secondary | ICD-10-CM | POA: Diagnosis not present

## 2019-11-25 DIAGNOSIS — M9901 Segmental and somatic dysfunction of cervical region: Secondary | ICD-10-CM | POA: Diagnosis not present

## 2019-11-25 DIAGNOSIS — M531 Cervicobrachial syndrome: Secondary | ICD-10-CM | POA: Diagnosis not present

## 2019-11-25 DIAGNOSIS — M530 Cervicocranial syndrome: Secondary | ICD-10-CM | POA: Diagnosis not present

## 2019-12-01 DIAGNOSIS — M9901 Segmental and somatic dysfunction of cervical region: Secondary | ICD-10-CM | POA: Diagnosis not present

## 2019-12-01 DIAGNOSIS — M530 Cervicocranial syndrome: Secondary | ICD-10-CM | POA: Diagnosis not present

## 2019-12-01 DIAGNOSIS — M9902 Segmental and somatic dysfunction of thoracic region: Secondary | ICD-10-CM | POA: Diagnosis not present

## 2019-12-01 DIAGNOSIS — M531 Cervicobrachial syndrome: Secondary | ICD-10-CM | POA: Diagnosis not present

## 2019-12-08 DIAGNOSIS — M530 Cervicocranial syndrome: Secondary | ICD-10-CM | POA: Diagnosis not present

## 2019-12-08 DIAGNOSIS — M9901 Segmental and somatic dysfunction of cervical region: Secondary | ICD-10-CM | POA: Diagnosis not present

## 2019-12-08 DIAGNOSIS — M531 Cervicobrachial syndrome: Secondary | ICD-10-CM | POA: Diagnosis not present

## 2019-12-08 DIAGNOSIS — M9902 Segmental and somatic dysfunction of thoracic region: Secondary | ICD-10-CM | POA: Diagnosis not present

## 2019-12-28 DIAGNOSIS — M9901 Segmental and somatic dysfunction of cervical region: Secondary | ICD-10-CM | POA: Diagnosis not present

## 2019-12-28 DIAGNOSIS — M530 Cervicocranial syndrome: Secondary | ICD-10-CM | POA: Diagnosis not present

## 2019-12-28 DIAGNOSIS — M9902 Segmental and somatic dysfunction of thoracic region: Secondary | ICD-10-CM | POA: Diagnosis not present

## 2019-12-28 DIAGNOSIS — M531 Cervicobrachial syndrome: Secondary | ICD-10-CM | POA: Diagnosis not present

## 2020-01-04 DIAGNOSIS — M531 Cervicobrachial syndrome: Secondary | ICD-10-CM | POA: Diagnosis not present

## 2020-01-04 DIAGNOSIS — M9902 Segmental and somatic dysfunction of thoracic region: Secondary | ICD-10-CM | POA: Diagnosis not present

## 2020-01-04 DIAGNOSIS — M530 Cervicocranial syndrome: Secondary | ICD-10-CM | POA: Diagnosis not present

## 2020-01-04 DIAGNOSIS — M9901 Segmental and somatic dysfunction of cervical region: Secondary | ICD-10-CM | POA: Diagnosis not present

## 2020-01-11 DIAGNOSIS — M9901 Segmental and somatic dysfunction of cervical region: Secondary | ICD-10-CM | POA: Diagnosis not present

## 2020-01-11 DIAGNOSIS — M9902 Segmental and somatic dysfunction of thoracic region: Secondary | ICD-10-CM | POA: Diagnosis not present

## 2020-01-11 DIAGNOSIS — M530 Cervicocranial syndrome: Secondary | ICD-10-CM | POA: Diagnosis not present

## 2020-01-11 DIAGNOSIS — M531 Cervicobrachial syndrome: Secondary | ICD-10-CM | POA: Diagnosis not present

## 2020-01-12 ENCOUNTER — Other Ambulatory Visit: Payer: Self-pay | Admitting: Sleep Medicine

## 2020-01-12 ENCOUNTER — Other Ambulatory Visit: Payer: BC Managed Care – PPO

## 2020-01-12 DIAGNOSIS — I471 Supraventricular tachycardia: Secondary | ICD-10-CM

## 2020-01-13 LAB — NOVEL CORONAVIRUS, NAA: SARS-CoV-2, NAA: DETECTED — AB

## 2020-01-13 LAB — SPECIMEN STATUS REPORT

## 2020-01-14 ENCOUNTER — Other Ambulatory Visit: Payer: Self-pay | Admitting: Oncology

## 2020-01-14 ENCOUNTER — Encounter: Payer: Self-pay | Admitting: Oncology

## 2020-01-14 DIAGNOSIS — U071 COVID-19: Secondary | ICD-10-CM

## 2020-01-14 NOTE — Progress Notes (Signed)
I connected by phone with  MRs. Averhart to discuss the potential use of an new treatment for mild to moderate COVID-19 viral infection in non-hospitalized patients.   This patient is a age/sex that meets the FDA criteria for Emergency Use Authorization of casirivimab\imdevimab.  Has a (+) direct SARS-CoV-2 viral test result 1. Has mild or moderate COVID-19  2. Is ? 55 years of age and weighs ? 40 kg 3. Is NOT hospitalized due to COVID-19 4. Is NOT requiring oxygen therapy or requiring an increase in baseline oxygen flow rate due to COVID-19 5. Is within 10 days of symptom onset 6. Has at least one of the high risk factor(s) for progression to severe COVID-19 and/or hospitalization as defined in EUA. ? Specific high risk criteria : CAD   Symptom onset 01/11/20   I have spoken and communicated the following to the patient or parent/caregiver:   1. FDA has authorized the emergency use of casirivimab\imdevimab for the treatment of mild to moderate COVID-19 in adults and pediatric patients with positive results of direct SARS-CoV-2 viral testing who are 85 years of age and older weighing at least 40 kg, and who are at high risk for progressing to severe COVID-19 and/or hospitalization.   2. The significant known and potential risks and benefits of casirivimab\imdevimab, and the extent to which such potential risks and benefits are unknown.   3. Information on available alternative treatments and the risks and benefits of those alternatives, including clinical trials.   4. Patients treated with casirivimab\imdevimab should continue to self-isolate and use infection control measures (e.g., wear mask, isolate, social distance, avoid sharing personal items, clean and disinfect "high touch" surfaces, and frequent handwashing) according to CDC guidelines.    5. The patient or parent/caregiver has the option to accept or refuse casirivimab\imdevimab .   After reviewing this information with the patient,  The patient agreed to proceed with receiving casirivimab\imdevimab infusion and will be provided a copy of the Fact sheet prior to receiving the infusion.Rulon Abide, AGNP-C [phone] ([place])

## 2020-01-15 ENCOUNTER — Ambulatory Visit (HOSPITAL_COMMUNITY)
Admission: RE | Admit: 2020-01-15 | Discharge: 2020-01-15 | Disposition: A | Discharge status: Home / Self Care | Payer: BC Managed Care – PPO | Source: Ambulatory Visit | Attending: Pulmonary Disease | Admitting: Pulmonary Disease

## 2020-01-15 DIAGNOSIS — U071 COVID-19: Secondary | ICD-10-CM

## 2020-01-15 MED ORDER — METHYLPREDNISOLONE SODIUM SUCC 125 MG IJ SOLR: 125.0000 mg | Freq: Once | INTRAMUSCULAR | Status: DC | PRN

## 2020-01-15 MED ORDER — FAMOTIDINE IN NACL 20-0.9 MG/50ML-% IV SOLN: 20.0000 mg | Freq: Once | INTRAVENOUS | Status: DC | PRN

## 2020-01-15 MED ORDER — DIPHENHYDRAMINE HCL 50 MG/ML IJ SOLN: 50.0000 mg | Freq: Once | INTRAMUSCULAR | Status: DC | PRN

## 2020-01-15 MED ORDER — EPINEPHRINE 0.3 MG/0.3ML IJ SOAJ: 0.3000 mg | Freq: Once | INTRAMUSCULAR | Status: DC | PRN

## 2020-01-15 MED ORDER — ALBUTEROL SULFATE HFA 108 (90 BASE) MCG/ACT IN AERS: 2.0000 | INHALATION_SPRAY | Freq: Once | RESPIRATORY_TRACT | Status: DC | PRN

## 2020-01-15 MED ORDER — SODIUM CHLORIDE 0.9 % IV SOLN
1200.0000 mg | Freq: Once | INTRAVENOUS | Status: AC
  Administered 2020-01-15: 1200 mg via INTRAVENOUS
  Filled 2020-01-15: qty 10

## 2020-01-15 MED ORDER — SODIUM CHLORIDE 0.9 % IV SOLN: INTRAVENOUS | Status: DC | PRN

## 2020-01-15 NOTE — Progress Notes (Signed)
  Diagnosis: COVID-19  Physician: Vaughan Browner MD  Procedure: [facility] Med: casirivimab\imdevimab infusion - Provided patient with casirivimab\imdevimab fact sheet for patients, parents and caregivers prior to infusion.  Complications: No immediate complications noted.  Discharge: Discharged home   Lucinda Dell 01/15/2020

## 2020-01-15 NOTE — Discharge Instructions (Signed)

## 2020-01-24 ENCOUNTER — Other Ambulatory Visit: Payer: Self-pay

## 2020-01-24 ENCOUNTER — Other Ambulatory Visit: Payer: BC Managed Care – PPO

## 2020-01-24 DIAGNOSIS — Z20822 Contact with and (suspected) exposure to covid-19: Secondary | ICD-10-CM

## 2020-01-24 DIAGNOSIS — E063 Autoimmune thyroiditis: Secondary | ICD-10-CM | POA: Diagnosis not present

## 2020-01-25 LAB — NOVEL CORONAVIRUS, NAA: SARS-CoV-2, NAA: NOT DETECTED

## 2020-01-25 LAB — SARS-COV-2, NAA 2 DAY TAT

## 2020-01-28 DIAGNOSIS — R635 Abnormal weight gain: Secondary | ICD-10-CM | POA: Diagnosis not present

## 2020-01-28 DIAGNOSIS — E063 Autoimmune thyroiditis: Secondary | ICD-10-CM | POA: Diagnosis not present

## 2020-02-09 DIAGNOSIS — M530 Cervicocranial syndrome: Secondary | ICD-10-CM | POA: Diagnosis not present

## 2020-02-09 DIAGNOSIS — M9901 Segmental and somatic dysfunction of cervical region: Secondary | ICD-10-CM | POA: Diagnosis not present

## 2020-02-09 DIAGNOSIS — M531 Cervicobrachial syndrome: Secondary | ICD-10-CM | POA: Diagnosis not present

## 2020-02-09 DIAGNOSIS — M9902 Segmental and somatic dysfunction of thoracic region: Secondary | ICD-10-CM | POA: Diagnosis not present

## 2020-02-15 DIAGNOSIS — M530 Cervicocranial syndrome: Secondary | ICD-10-CM | POA: Diagnosis not present

## 2020-02-15 DIAGNOSIS — M9902 Segmental and somatic dysfunction of thoracic region: Secondary | ICD-10-CM | POA: Diagnosis not present

## 2020-02-15 DIAGNOSIS — M9901 Segmental and somatic dysfunction of cervical region: Secondary | ICD-10-CM | POA: Diagnosis not present

## 2020-02-15 DIAGNOSIS — M531 Cervicobrachial syndrome: Secondary | ICD-10-CM | POA: Diagnosis not present

## 2020-02-23 DIAGNOSIS — M9902 Segmental and somatic dysfunction of thoracic region: Secondary | ICD-10-CM | POA: Diagnosis not present

## 2020-02-23 DIAGNOSIS — M531 Cervicobrachial syndrome: Secondary | ICD-10-CM | POA: Diagnosis not present

## 2020-02-23 DIAGNOSIS — M9901 Segmental and somatic dysfunction of cervical region: Secondary | ICD-10-CM | POA: Diagnosis not present

## 2020-02-23 DIAGNOSIS — M530 Cervicocranial syndrome: Secondary | ICD-10-CM | POA: Diagnosis not present

## 2020-03-01 DIAGNOSIS — M9901 Segmental and somatic dysfunction of cervical region: Secondary | ICD-10-CM | POA: Diagnosis not present

## 2020-03-01 DIAGNOSIS — M9902 Segmental and somatic dysfunction of thoracic region: Secondary | ICD-10-CM | POA: Diagnosis not present

## 2020-03-01 DIAGNOSIS — M530 Cervicocranial syndrome: Secondary | ICD-10-CM | POA: Diagnosis not present

## 2020-03-01 DIAGNOSIS — M531 Cervicobrachial syndrome: Secondary | ICD-10-CM | POA: Diagnosis not present

## 2020-03-14 DIAGNOSIS — M9902 Segmental and somatic dysfunction of thoracic region: Secondary | ICD-10-CM | POA: Diagnosis not present

## 2020-03-14 DIAGNOSIS — M530 Cervicocranial syndrome: Secondary | ICD-10-CM | POA: Diagnosis not present

## 2020-03-14 DIAGNOSIS — M531 Cervicobrachial syndrome: Secondary | ICD-10-CM | POA: Diagnosis not present

## 2020-03-14 DIAGNOSIS — M9901 Segmental and somatic dysfunction of cervical region: Secondary | ICD-10-CM | POA: Diagnosis not present

## 2020-03-27 DIAGNOSIS — M5412 Radiculopathy, cervical region: Secondary | ICD-10-CM | POA: Diagnosis not present

## 2020-03-27 DIAGNOSIS — M47812 Spondylosis without myelopathy or radiculopathy, cervical region: Secondary | ICD-10-CM | POA: Diagnosis not present

## 2020-03-27 DIAGNOSIS — M5030 Other cervical disc degeneration, unspecified cervical region: Secondary | ICD-10-CM | POA: Diagnosis not present

## 2020-03-27 DIAGNOSIS — M62838 Other muscle spasm: Secondary | ICD-10-CM | POA: Diagnosis not present

## 2020-03-29 DIAGNOSIS — M530 Cervicocranial syndrome: Secondary | ICD-10-CM | POA: Diagnosis not present

## 2020-03-29 DIAGNOSIS — M9901 Segmental and somatic dysfunction of cervical region: Secondary | ICD-10-CM | POA: Diagnosis not present

## 2020-03-29 DIAGNOSIS — M531 Cervicobrachial syndrome: Secondary | ICD-10-CM | POA: Diagnosis not present

## 2020-03-29 DIAGNOSIS — M9902 Segmental and somatic dysfunction of thoracic region: Secondary | ICD-10-CM | POA: Diagnosis not present

## 2020-04-11 ENCOUNTER — Encounter: Payer: BC Managed Care – PPO | Admitting: Family Medicine

## 2020-04-15 ENCOUNTER — Ambulatory Visit
Admission: EM | Admit: 2020-04-15 | Discharge: 2020-04-15 | Disposition: A | Discharge status: Home / Self Care | Payer: BC Managed Care – PPO | Attending: Family Medicine | Admitting: Family Medicine

## 2020-04-15 ENCOUNTER — Other Ambulatory Visit: Payer: Self-pay

## 2020-04-15 DIAGNOSIS — Z20822 Contact with and (suspected) exposure to covid-19: Secondary | ICD-10-CM | POA: Diagnosis not present

## 2020-04-15 DIAGNOSIS — J209 Acute bronchitis, unspecified: Secondary | ICD-10-CM | POA: Insufficient documentation

## 2020-04-15 LAB — RESP PANEL BY RT-PCR (FLU A&B, COVID) ARPGX2
Influenza A by PCR: NEGATIVE
Influenza B by PCR: NEGATIVE
SARS Coronavirus 2 by RT PCR: NEGATIVE

## 2020-04-15 MED ORDER — PREDNISONE 50 MG PO TABS: ORAL_TABLET | ORAL | 0 refills | Status: DC

## 2020-04-15 MED ORDER — AZITHROMYCIN 250 MG PO TABS: ORAL_TABLET | ORAL | 0 refills | Status: DC

## 2020-04-15 MED ORDER — HYDROCODONE-HOMATROPINE 5-1.5 MG/5ML PO SYRP
5.0000 mL | ORAL_SOLUTION | Freq: Four times a day (QID) | ORAL | 0 refills | Status: DC | PRN
Start: 2020-04-15 — End: 2020-05-23

## 2020-04-15 NOTE — ED Triage Notes (Addendum)
Patient states that last Friday she started having a tickle in her throat and has progressed to a cough, nasal congestion, body aches. Patient states that she is concerned that she has the flu. Patient states that she did have covid in 01/2020 and had a negative test earlier this week.

## 2020-04-15 NOTE — Discharge Instructions (Signed)
Lots of fluids.  Medication as prescribed.  Take care  Dr. Tonjua Rossetti  

## 2020-04-15 NOTE — ED Provider Notes (Signed)
[facility]    CSN: [id number] Arrival date & time: 04/15/20  0802  History   Chief Complaint Chief Complaint  Patient presents with  . Cough   HPI  54 year old female presents with respiratory symptoms.  Patient reports that she feels very poorly.  She has been sick since last Friday.  She reports severe cough and associated chest tightness/congestion.  She has had body aches as well.  No documented fever.  She has recently had COVID-19.  Patient is concerned she may have influenza.  She states that she has had a decrease in appetite and is not eating or drinking very much.  She is most bothered by cough.  She has used some over-the-counter medication with some improvement but she continues to feel poorly.  Past Medical History:  Diagnosis Date  . Abnormal Pap smear 2002   LGSIL  . Allergy   . Arthritis    neck  . Constipation    1 bm every 3-4 days - hard stools   . GERD (gastroesophageal reflux disease)    when on a medicine for uterine bleeding  . Hemorrhoid   . History of blood transfusion    for uterine bleeding; now resolved  . Hypothyroidism   . Shortness of breath dyspnea    with exercise since starting megace    Patient Active Problem List   Diagnosis Date Noted  . Cervical disc disorder with radiculopathy 10/23/2017  . Insomnia 08/24/2015  . Stress incontinence in female 08/24/2015  . FH: diabetes mellitus 08/24/2015  . S/P laparoscopic assisted vaginal hysterectomy (LAVH) 06/06/2015  . Rosacea 02/24/2015  . Hypothyroidism 01/26/2015  . Systolic murmur 28/31/5176  . IBS (irritable bowel syndrome) 06/13/2011  . Hemorrhoid 06/04/2011    Past Surgical History:  Procedure Laterality Date  . BREAST BIOPSY Left 1995   neg  . BREAST SURGERY  1994   left breast bx  . COLONOSCOPY    . COLONOSCOPY W/ BIOPSIES  2013   divert. cleared for 5 yrs- [facility]  . COLPOSCOPY  2002   ABNORMAL PAP  . CYSTOSCOPY  06/06/2015   Procedure: CYSTOSCOPY;   Surgeon: Osborne Oman, MD;  Location: [facility];  Service: Gynecology;;  . DILATION AND CURETTAGE OF UTERUS     TAB  . LAPAROSCOPIC VAGINAL HYSTERECTOMY WITH SALPINGO OOPHORECTOMY N/A 06/06/2015   Procedure: LAPAROSCOPIC ASSISTED VAGINAL HYSTERECTOMY ;  Surgeon: Osborne Oman, MD;  Location: [facility];  Service: Gynecology;  Laterality: N/A;  722.9grams   . LEEP  2002  . POLYPECTOMY    . TUBAL LIGATION    . VAGINAL HYSTERECTOMY      OB History    Gravida  3   Para  2   Term  2   Preterm      AB  1   Living  2     SAB      TAB      Ectopic      Multiple      Live Births  2            Home Medications    Prior to Admission medications   Medication Sig Start Date End Date Taking? Authorizing Provider  Acetaminophen (TYLENOL ARTHRITIS PAIN PO) Take 2 tablets by mouth daily as needed (pain).   Yes [provider]  B COMPLEX-C PO Take by mouth.   Yes [provider]  Black Cohosh-SoyIsoflav-Magnol (ESTROVEN MENOPAUSE RELIEF PO) Take 1 capsule daily by mouth. otc  Yes [provider]  diphenhydrAMINE (BENADRYL) 25 MG tablet Take 25 mg by mouth every 6 (six) hours as needed.   Yes [provider]  docusate sodium (COLACE) 250 MG capsule Take 250 mg by mouth daily.   Yes [provider]  EVENING PRIMROSE OIL PO Take by mouth.   Yes [provider]  Glucos-Chond-Hyal Ac-Ca Fructo (MOVE FREE JOINT HEALTH ADVANCE) TABS Take by mouth.   Yes [provider]  ibuprofen (ADVIL) 200 MG tablet Take 200 mg by mouth every 6 (six) hours as needed. 400-600 mg prn   Yes [provider]  loratadine (CLARITIN) 10 MG tablet Take 10 mg by mouth daily as needed for allergies. otc   Yes [provider]  Melatonin 10 MG TABS Take by mouth.   Yes [provider]  METRONIDAZOLE, TOPICAL, 0.75 % LOTN Apply 1 application topically 2 (two) times daily. Patient taking differently: Apply 1 application  topically daily as needed.  02/24/15  Yes Plonk, Gwyndolyn Saxon, MD  Multiple Vitamins-Minerals (MULTIVITAMIN WITH MINERALS) tablet Take 1 tablet by mouth daily.   Yes [provider]  OVER THE COUNTER MEDICATION For sleep Camomile and Melatonin and another ingredient pt unsure of   Yes [provider]  OVER THE COUNTER MEDICATION Brain performance supplement daily   Yes [provider]  Probiotic Product (ALIGN) 4 MG CAPS Take 1 capsule by mouth daily. 06/13/11  Yes Pyrtle, Lajuan Lines, MD  thyroid (ARMOUR) 60 MG tablet Take 1 tablet by mouth daily. Warnell Forester 04/01/19  Yes [provider]  Turmeric 500 MG CAPS Take by mouth.   Yes [provider]  azithromycin (ZITHROMAX) 250 MG tablet 2 tablets on day 1, then 1 tablet daily on days 2-5. 04/15/20   Cook, Barnie Del, DO  HYDROcodone-homatropine (HYCODAN) 5-1.5 MG/5ML syrup Take 5 mLs by mouth every 6 (six) hours as needed for cough. 04/15/20   Coral Spikes, DO  predniSONE (DELTASONE) 50 MG tablet 1 tablet daily x 5 days 04/15/20   Thersa Salt G, DO  montelukast (SINGULAIR) 10 MG tablet Take 1 tablet (10 mg total) by mouth at bedtime. 02/24/19 04/15/20  Juline Patch, MD    Family History Family History  Problem Relation Age of Onset  . Hypertension Father   . Diabetes Father   . Cancer Maternal Grandmother        female cancer  . Stroke Maternal Grandfather   . Diabetes Paternal Aunt   . Heart disease Paternal Aunt   . Diabetes Paternal Uncle   . Heart disease Paternal Uncle   . Colon cancer Neg Hx   . Esophageal cancer Neg Hx   . Stomach cancer Neg Hx   . Prostate cancer Neg Hx   . Kidney cancer Neg Hx   . Bladder Cancer Neg Hx   . Breast cancer Neg Hx   . Colon polyps Neg Hx     Social History Social History   Tobacco Use  . Smoking status: Never Smoker  . Smokeless tobacco: Never Used  Substance Use Topics  . Alcohol use: No    Alcohol/week: 0.0 standard drinks  . Drug use: No      Allergies   Doxycycline, Ciprofloxacin, and Sulfa antibiotics   Review of Systems Review of Systems  Constitutional: Positive for fatigue. Negative for fever.  HENT: Positive for congestion.   Respiratory: Positive for cough and chest tightness.    Physical Exam Triage Vital Signs ED Triage Vitals  Enc Vitals Group     BP 04/15/20 0826 112/75     Pulse Rate 04/15/20 0826 61     Resp 04/15/20 0826 18     Temp 04/15/20 0826 98.5 F (36.9 C)     Temp Source 04/15/20 0826 Oral     SpO2 04/15/20 0826 96 %     Weight 04/15/20 0822 178 lb (80.7 kg)     Height 04/15/20 0822 5\' 6"  (1.676 m)     Head Circumference --      Peak Flow --      Pain Score 04/15/20 0822 8     Pain Loc --      Pain Edu? --      Excl. in [place]? --    Updated Vital Signs BP 112/75 (BP Location: Right Arm)   Pulse 61   Temp 98.5 F (36.9 C) (Oral)   Resp 18   Ht 5\' 6"  (1.676 m)   Wt 80.7 kg   LMP 12/12/2014   SpO2 96%   BMI 28.73 kg/m   Visual Acuity Right Eye Distance:   Left Eye Distance:   Bilateral Distance:    Right Eye Near:   Left Eye Near:    Bilateral Near:     Physical Exam Vitals and nursing note reviewed.  Constitutional:      Comments: Appears fatigued.   HENT:     Head: Normocephalic and atraumatic.  Eyes:     General:        Right eye: No discharge.        Left eye: No discharge.     Conjunctiva/sclera: Conjunctivae normal.  Cardiovascular:     Rate and Rhythm: Normal rate and regular rhythm.     Heart sounds: No murmur heard.   Pulmonary:     Effort: Pulmonary effort is normal.     Breath sounds: Normal breath sounds. No wheezing or rales.  Neurological:     Mental Status: She is alert.  Psychiatric:        Mood and Affect: Mood normal.        Behavior: Behavior normal.    UC Treatments / Results  Labs (all labs ordered are listed, but only abnormal results are displayed) Labs Reviewed  RESP PANEL BY RT-PCR (FLU A&B, COVID) [id number]    EKG   Radiology No  results found.  Procedures Procedures (including critical care time)  Medications Ordered in UC Medications - No data to display  Initial Impression / Assessment and Plan / UC Course  I have reviewed the triage vital signs and the nursing notes.  Pertinent labs & imaging results that were available during my care of the patient were reviewed by me and considered in my medical decision making (see chart for details).    54 year old female presents with acute bronchitis.  Treated with azithromycin, prednisone.  Hycodan for cough.  Final Clinical Impressions(s) / UC Diagnoses   Final diagnoses:  Acute bronchitis, unspecified organism     Discharge Instructions     Lots of fluids.  Medication as prescribed.  Take care  Dr. Lacinda Axon    ED Prescriptions    Medication Sig Dispense Auth. Provider   azithromycin (ZITHROMAX) 250 MG tablet 2 tablets on day 1, then 1 tablet daily on days 2-5. 6 tablet Cook, Jayce G, DO   predniSONE (DELTASONE) 50 MG tablet 1 tablet daily x 5 days 5 tablet Cook, Jayce G, DO   HYDROcodone-homatropine (HYCODAN) 5-1.5 MG/5ML syrup Take 5 mLs  by mouth every 6 (six) hours as needed for cough. 120 mL Coral Spikes, DO     PDMP not reviewed this encounter.   Thersa Salt [place], [place] 04/15/20 [phone]

## 2020-04-18 ENCOUNTER — Encounter: Payer: BC Managed Care – PPO | Admitting: Family Medicine

## 2020-05-17 DIAGNOSIS — M531 Cervicobrachial syndrome: Secondary | ICD-10-CM | POA: Diagnosis not present

## 2020-05-17 DIAGNOSIS — M9902 Segmental and somatic dysfunction of thoracic region: Secondary | ICD-10-CM | POA: Diagnosis not present

## 2020-05-17 DIAGNOSIS — M9901 Segmental and somatic dysfunction of cervical region: Secondary | ICD-10-CM | POA: Diagnosis not present

## 2020-05-17 DIAGNOSIS — M530 Cervicocranial syndrome: Secondary | ICD-10-CM | POA: Diagnosis not present

## 2020-05-23 ENCOUNTER — Ambulatory Visit (INDEPENDENT_AMBULATORY_CARE_PROVIDER_SITE_OTHER): Payer: BC Managed Care – PPO | Admitting: Family Medicine

## 2020-05-23 ENCOUNTER — Encounter: Payer: Self-pay | Admitting: Family Medicine

## 2020-05-23 ENCOUNTER — Other Ambulatory Visit: Payer: Self-pay

## 2020-05-23 VITALS — BP 127/64 | HR 62 | Temp 97.5°F | Resp 16 | Ht 66.0 in | Wt 178.0 lb

## 2020-05-23 DIAGNOSIS — M4802 Spinal stenosis, cervical region: Secondary | ICD-10-CM

## 2020-05-23 DIAGNOSIS — M79672 Pain in left foot: Secondary | ICD-10-CM

## 2020-05-23 DIAGNOSIS — M4722 Other spondylosis with radiculopathy, cervical region: Secondary | ICD-10-CM

## 2020-05-23 DIAGNOSIS — Z7689 Persons encountering health services in other specified circumstances: Secondary | ICD-10-CM

## 2020-05-23 DIAGNOSIS — G8929 Other chronic pain: Secondary | ICD-10-CM

## 2020-05-23 DIAGNOSIS — M542 Cervicalgia: Secondary | ICD-10-CM | POA: Diagnosis not present

## 2020-05-23 DIAGNOSIS — M79644 Pain in right finger(s): Secondary | ICD-10-CM

## 2020-05-23 DIAGNOSIS — M1811 Unilateral primary osteoarthritis of first carpometacarpal joint, right hand: Secondary | ICD-10-CM

## 2020-05-23 MED ORDER — GABAPENTIN 100 MG PO CAPS: ORAL_CAPSULE | ORAL | 1 refills | Status: DC

## 2020-05-23 MED ORDER — BACLOFEN 10 MG PO TABS: 5.0000 mg | ORAL_TABLET | Freq: Three times a day (TID) | ORAL | 1 refills | Status: DC | PRN

## 2020-05-23 NOTE — Patient Instructions (Addendum)
Thank you for coming to the office today.  [place] Address: [address], [place], [place] [address] Phone: [phone]  Stay tuned for apt  Start Gabapentin 100mg  capsules, take at night for 2-3 nights only, and then increase to 2 times a day for a few days, and then may increase to 3 times a day, it may make you drowsy, if helps significantly at night only, then you can increase instead to 3 capsules at night, instead of 3 times a day - In the future if needed, we can significantly increase the dose if tolerated well, some common doses are 300mg  three times a day up to 600mg  three times a day, usually it takes several weeks or months to get to higher doses   Start taking Baclofen (Lioresal) 10mg  (muscle relaxant) - start with one pill at night as needed for next 1-3 nights (may make you drowsy, caution with driving) see how it affects you, then if tolerated increase to one pill 2 to 3 times a day or (every 8 hours as needed)     Please schedule a Follow-up Appointment to: Return in about 3 months (around 08/21/2020) for 3 month follow-up Neck Pain / Thumb / arthritis meds.  If you have any other questions or concerns, please feel free to call the office or send a message through [place]. You may also schedule an earlier appointment if necessary.  Additionally, you may be receiving a survey about your experience at our office within a few days to 1 week by e-mail or mail. We value your feedback.  Nobie Putnam, DO [facility], [facility]               Plantar Fascia Stretches / Exercises  See other page with pictures of each exercise.  Start with 1 or 2 of these exercises that you are most comfortable with. Do not do any exercises that cause you significant worsening pain. Some of these may cause some "stretching soreness" but it should go away after you stop the exercise, and get better over time. Gradually increase up to 3-4 exercises  as tolerated.  You may begin exercising the muscles of your foot right away by gently stretching them as follows:  Stretching: Towel stretch: Sit on a hard surface with your injured leg stretched out in front of you. Loop a towel around the ball of your foot and pull the towel toward your body keeping your knee straight. Hold this position for 15 to 30 seconds then relax. Repeat 3 times. When the towel stretch becomes to easy, you may begin doing the standing calf stretch.  Standing calf stretch: Facing a wall, put your hands against the wall at about eye level. Keep the injured leg back, the uninjured leg forward, and the heel of your injured leg on the floor. Turn your injured foot slightly inward (as if you were pigeon-toed) as you slowly lean into the wall until you feel a stretch in the back of your calf. Hold for 15 to 30 seconds. Repeat 3 times. Do this exercise several times each day. When you can stand comfortably on your injured foot, you can begin stretching the bottom of your foot using the plantar fascia stretch.  Plantar fascia stretch: Stand with the ball of your injured foot on a stair. Reach for the bottom step with your heel until you feel a stretch in the arch of your foot. Hold this position for 15 to 30 seconds and then  relax. Repeat 3 times. After you have stretched the bottom muscles of your foot, you can begin strengthening the top muscles of your foot.  Frozen can roll: Roll your bare injured foot back and forth from your heel to your mid-arch over a frozen juice can. Repeat for 3 to 5 minutes. This exercise is particularly helpful if done first thing in the morning. Towel pickup: With your heel on the ground, pick up a towel with your toes. Release. Repeat 10 to 20 times. When this gets easy, add more resistance by placing a book or small weight on the towel. Static and dynamic balance exercises Place a chair next to your non-injured leg and stand upright. (This will  provide you with balance if needed.) Stand on your injured foot. Try to raise the arch of your foot while keeping your toes on the floor. Try to maintain this position and balance on your injured side for 30 seconds. This exercise can be made more difficult by doing it on a piece of foam or a pillow, or with your eyes closed. Stand in the same position as above. Keep your foot in this position and reach forward in front of you with your injured side's hand, allowing your knee to bend. Repeat this 10 times while maintaining the arch height. This exercise can be made more difficult by reaching farther in front of you. Do 2 sets. Stand in the same position as above. While maintaining your arch height, reach the injured side's hand across your body toward the chair. The farther you reach, the more challenging the exercise. Do 2 sets of 10.  Next, you can begin strengthening the muscles of your foot and lower leg by using elastic tubing.  Strengthening: Resisted dorsiflexion: Sit with your injured leg out straight and your foot facing a doorway. Tie a loop in one end of the tubing. Put your foot through the loop so that the tubing goes around the arch of your foot. Tie a knot in the other end of the tubing and shut the knot in the door. Move backward until there is tension in the tubing. Keeping your knee straight, pull your foot toward your body, stretching the tubing. Slowly return to the starting position. Do 3 sets of 10. Resisted plantar flexion: Sit with your leg outstretched and loop the middle section of the tubing around the ball of your foot. Hold the ends of the tubing in both hands. Gently press the ball of your foot down and point your toes, stretching the tubing. Return to the starting position. Do 3 sets of 10. Resisted inversion: Sit with your legs out straight and cross your uninjured leg over your injured ankle. Wrap the tubing around the ball of your injured foot and then loop it around your  uninjured foot so that the tubing is anchored there at one end. Hold the other end of the tubing in your hand. Turn your injured foot inward and upward. This will stretch the tubing. Return to the starting position. Do 3 sets of 10. Resisted eversion: Sit with both legs stretched out in front of you, with your feet about a shoulder's width apart. Tie a loop in one end of the tubing. Put your injured foot through the loop so that the tubing goes around the arch of that foot and wraps around the outside of the uninjured foot. Hold onto the other end of the tubing with your hand to provide tension. Turn your injured foot up and out.  Make sure you keep your uninjured foot still so that it will allow the tubing to stretch as you move your injured foot. Return to the starting position. Do 3 sets of 10.

## 2020-05-23 NOTE — Progress Notes (Signed)
Subjective:    Patient ID: [name], female    DOB: February 19, 1966, 55 y.o.   MRN: [id number]  [name] is a 55 y.o. female presenting on 05/23/2020 for Establish Care and Neck Pain  Previous PCP Dr Otilio Miu for years, no regular PCP until age 51. Her husband Merry Proud is a patient of mine here at [facility] as well. She is here to transfer.  HPI   Chronic Neck Pain Reports original onset injury about 3-4 years ago, doing yoga she had pinched nerves in neck, with pain radiating into Left arm from neck with severe pain and paresthesia. She was originally diagnosed with herniated discs in neck, she was treated with muscle relaxants, prednisone, muscle pain and other medications. She was referred to [facility] and also [facility] with Dr Sharlet Salina, they did MRI 03/2019. They have now proceeded to cortisone injections, then nerve blocks, then now has completed nerve ablation treatments (duratoin 10-12 months approx) - she has documented mild foraminal stenosis on R side, and C4-5 mild DDD, severe Left facet arthropathy. See results below. She has been on other medications and unsure exactly which ones she has tried in past. She is planning to avoid surgery in future. - She has not been on Gabapentin or muscle relaxant lately, last was few years ago in past was on other muscle relaxant  Right Thumb Pain / Swelling She is Right handed Reports chronic issue with pain and swelling in R thumb, she writes all day, she has tried Thumb Spica Splint for R thumb. Voltaren topical rx using up to 4 times day without relief - No limited topical treatments - Tylenol Arthritis, Motrin limited options.  Chronic Foot Pain / Plantar Fasciitis Left Foot Pes Planus Reports >5 years some chronic foot pain, left foot worse pain, she uses rx arch supports, initially it did improve with insoles. Now worsening since having COVID in September 2021, she has had some worsening aches and pains as  well. daily aches and pains, have affected her lifestyle.   Health Maintenance: Future visit for preventative care, not focus of todays visit.  Depression screen [facility] 2/9 05/23/2020 02/24/2019 01/14/2019  Decreased Interest 0 0 0  Down, Depressed, Hopeless 0 0 1  PHQ - 2 Score 0 0 1  Altered sleeping - 0 3  Tired, decreased energy - 0 3  Change in appetite - 0 0  Feeling bad or failure about yourself  - 0 0  Trouble concentrating - 0 3  Moving slowly or fidgety/restless - 0 0  Suicidal thoughts - 0 0  PHQ-9 Score - 0 10  Difficult doing work/chores - - Not difficult at all    Past Medical History:  Diagnosis Date  . Abnormal Pap smear 2002   LGSIL  . Allergy   . Arthritis    neck  . Constipation    1 bm every 3-4 days - hard stools   . GERD (gastroesophageal reflux disease)    when on a medicine for uterine bleeding  . Hemorrhoid   . History of blood transfusion    for uterine bleeding; now resolved  . Hypothyroidism   . Shortness of breath dyspnea    with exercise since starting megace    Past Surgical History:  Procedure Laterality Date  . BREAST BIOPSY Left 1995   neg  . BREAST SURGERY  1994   left breast bx  . COLONOSCOPY    . COLONOSCOPY W/ BIOPSIES  2013  divert. cleared for 5 yrs- [facility]  . COLPOSCOPY  2002   ABNORMAL PAP  . CYSTOSCOPY  06/06/2015   Procedure: CYSTOSCOPY;  Surgeon: Osborne Oman, MD;  Location: [facility];  Service: Gynecology;;  . DILATION AND CURETTAGE OF UTERUS     TAB  . LAPAROSCOPIC VAGINAL HYSTERECTOMY WITH SALPINGO OOPHORECTOMY N/A 06/06/2015   Procedure: LAPAROSCOPIC ASSISTED VAGINAL HYSTERECTOMY ;  Surgeon: Osborne Oman, MD;  Location: [facility];  Service: Gynecology;  Laterality: N/A;  722.9grams   . LEEP  2002  . POLYPECTOMY    . TUBAL LIGATION    . VAGINAL HYSTERECTOMY  05/2015   Social History   Socioeconomic History  . Marital status: Married    Spouse name: Not on file  . Number of children: 2  . Years of  education: Not on file  . Highest education level: Not on file  Occupational History  . Occupation: Engineer, mining: [employer]  Tobacco Use  . Smoking status: Never Smoker  . Smokeless tobacco: Never Used  Substance and Sexual Activity  . Alcohol use: No    Alcohol/week: 0.0 standard drinks  . Drug use: No  . Sexual activity: Not Currently    Partners: Male    Birth control/protection: Surgical  Other Topics Concern  . Not on file  Social History Narrative  . Not on file   Social Determinants of Health   Financial Resource Strain: Not on file  Food Insecurity: Not on file  Transportation Needs: Not on file  Physical Activity: Not on file  Stress: Not on file  Social Connections: Not on file  Intimate Partner Violence: Not on file   Family History  Problem Relation Age of Onset  . Hypertension Father   . Diabetes Father   . Cancer Maternal Grandmother        female cancer  . Stroke Maternal Grandfather   . Diabetes Paternal Aunt   . Heart disease Paternal Aunt   . Diabetes Paternal Uncle   . Heart disease Paternal Uncle   . Colon cancer Neg Hx   . Esophageal cancer Neg Hx   . Stomach cancer Neg Hx   . Prostate cancer Neg Hx   . Kidney cancer Neg Hx   . Bladder Cancer Neg Hx   . Breast cancer Neg Hx   . Colon polyps Neg Hx    Current Outpatient Medications on File Prior to Visit  Medication Sig  . Acetaminophen (TYLENOL ARTHRITIS PAIN PO) Take 2 tablets by mouth daily as needed (pain).  . B COMPLEX-C PO Take by mouth.  . Black Cohosh-SoyIsoflav-Magnol (ESTROVEN MENOPAUSE RELIEF PO) Take 1 capsule by mouth daily. otc  . diphenhydrAMINE (BENADRYL) 25 MG tablet Take 25 mg by mouth every 6 (six) hours as needed.  . docusate sodium (COLACE) 250 MG capsule Take 250 mg by mouth daily.  Marland Kitchen EVENING PRIMROSE OIL PO Take by mouth.  . Glucos-Chond-Hyal Ac-Ca Fructo (MOVE FREE JOINT HEALTH ADVANCE) TABS Take by mouth.  Marland Kitchen ibuprofen (ADVIL) 200 MG  tablet Take 200 mg by mouth every 6 (six) hours as needed. 400-600 mg prn  . loratadine (CLARITIN) 10 MG tablet Take 10 mg by mouth daily as needed for allergies. otc  . Melatonin 10 MG TABS Take by mouth.  . METRONIDAZOLE, TOPICAL, 0.75 % LOTN Apply 1 application topically 2 (two) times daily. (Patient taking differently: Apply 1 application topically daily as needed.)  . Multiple Vitamins-Minerals (MULTIVITAMIN WITH  MINERALS) tablet Take 1 tablet by mouth daily.  Marland Kitchen OVER THE COUNTER MEDICATION For sleep Camomile and Melatonin and another ingredient pt unsure of  . OVER THE COUNTER MEDICATION Brain performance supplement daily  . Probiotic Product (ALIGN) 4 MG CAPS Take 1 capsule by mouth daily.  Marland Kitchen thyroid (ARMOUR) 60 MG tablet Take 1 tablet by mouth daily. [employer]  . Turmeric 500 MG CAPS Take by mouth.  . [DISCONTINUED] montelukast (SINGULAIR) 10 MG tablet Take 1 tablet (10 mg total) by mouth at bedtime.   No current facility-administered medications on file prior to visit.    Review of Systems Per HPI unless specifically indicated above      Objective:    BP 127/64   Pulse 62   Temp (!) 97.5 F (36.4 C) (Temporal)   Resp 16   Wt 178 lb (80.7 kg)   LMP 12/12/2014   SpO2 100%   BMI 28.73 kg/m   Wt Readings from Last 3 Encounters:  05/23/20 178 lb (80.7 kg)  04/15/20 178 lb (80.7 kg)  11/02/19 171 lb (77.6 kg)    Physical Exam Vitals and nursing note reviewed.  Constitutional:      General: She is not in acute distress.    Appearance: She is well-developed and well-nourished. She is not diaphoretic.     Comments: Well-appearing, comfortable, cooperative  HENT:     Head: Normocephalic and atraumatic.     Mouth/Throat:     Mouth: Oropharynx is clear and moist.  Eyes:     General:        Right eye: No discharge.        Left eye: No discharge.     Conjunctiva/sclera: Conjunctivae normal.  Neck:     Comments: Neck Reduced ROM flex/ext and rotation  bilateral Muscle hypertonicity spasm. Cardiovascular:     Rate and Rhythm: Normal rate.  Pulmonary:     Effort: Pulmonary effort is normal.  Musculoskeletal:        General: No edema.     Comments: Right Hand/Wrist Inspection: Normal appearance, symmetrical, except some mild bulky MCP joint (1st) and thumb CMC joint, no edema or erythema. Palpation: Mild tender base of thumb CMC, otherwise non tender hand / wrist, carpal bones, including MCP. No distinct anatomical snuff box or scaphoid tenderness. No tenderness over APL / EPB tendons radially. ROM: full active wrist ROM flex / ext, ulnar / radial deviation, no pain with radial deviation Special Testing: Negative Finkelstein's test and Negative  Tinel's median nerve Strength: 5/5 grip, thumb opposition, wrist flex/ext Neurovascular: distally intact  Feet bilateral Pes planus LEFT worse than R. No obvious deformity otherwise. Localized symptoms non reproduced today on exam L heel  Skin:    General: Skin is warm and dry.     Findings: No erythema or rash.  Neurological:     Mental Status: She is alert and oriented to person, place, and time.  Psychiatric:        Mood and Affect: Mood and affect normal.        Behavior: Behavior normal.     Comments: Well groomed, good eye contact, normal speech and thoughts      I have personally reviewed the radiology report from 03/20/19 on C Spine MRI.  CLINICAL DATA:  Cervical pain for 1.5 years. Pain runs from the neck into the left arm.  EXAM: MRI CERVICAL SPINE WITHOUT CONTRAST  TECHNIQUE: Multiplanar, multisequence MR imaging of the cervical spine was performed. No intravenous contrast  was administered.  COMPARISON:  None.  FINDINGS: Alignment: Physiologic.  Vertebrae: No fracture, evidence of discitis, or bone lesion.  Cord: Normal signal and morphology.  Posterior Fossa, vertebral arteries, paraspinal tissues: Posterior fossa demonstrates no focal abnormality.  Vertebral artery flow voids are maintained. Paraspinal soft tissues are unremarkable.  Disc levels:  Disc spaces: Disc spaces are maintained.  C2-3: No significant disc bulge. Mild bilateral facet arthropathy. No neural foraminal stenosis. No central canal stenosis.  C3-4: No significant disc bulge. Moderate bilateral facet arthropathy. Mild right foraminal stenosis. No left foraminal stenosis. No central canal stenosis.  C4-5: Minimal broad-based disc bulge. Severe left facet arthropathy. Bilateral uncovertebral degenerative changes. Mild left and moderate right foraminal stenosis. No central canal stenosis.  C5-6: Mild broad-based disc bulge. Moderate right and mild left facet arthropathy. Mild left foraminal stenosis. No right foraminal stenosis. No central canal stenosis.  C6-7: Mild broad-based disc bulge. No neural foraminal stenosis. No central canal stenosis.  C7-T1: No significant disc bulge. No neural foraminal stenosis. No central canal stenosis.  IMPRESSION: 1. Mild cervical spine spondylosis as described above. 2.  No acute osseous injury of the cervical spine.   Electronically Signed   By: Kathreen Devoid   On: 03/20/2019 12:26   Results for orders placed or performed during the hospital encounter of 04/15/20  Resp Panel by RT-PCR (Flu A&B, Covid) Nasopharyngeal Swab   Specimen: Nasopharyngeal Swab; Nasopharyngeal(NP) swabs in vial transport medium  Result Value Ref Range   SARS Coronavirus 2 by RT PCR NEGATIVE NEGATIVE   Influenza A by PCR NEGATIVE NEGATIVE   Influenza B by PCR NEGATIVE NEGATIVE      Assessment & Plan:   Problem List Items Addressed This Visit    Primary osteoarthritis of first carpometacarpal joint of right hand   Relevant Medications   baclofen (LIORESAL) 10 MG tablet   Osteoarthritis of spine with radiculopathy, cervical region - Primary   Relevant Medications   gabapentin (NEURONTIN) 100 MG capsule   baclofen  (LIORESAL) 10 MG tablet   Chronic thumb pain, right   Relevant Medications   gabapentin (NEURONTIN) 100 MG capsule   baclofen (LIORESAL) 10 MG tablet   Chronic neck pain   Relevant Medications   gabapentin (NEURONTIN) 100 MG capsule   baclofen (LIORESAL) 10 MG tablet   Chronic heel pain, left   Relevant Medications   gabapentin (NEURONTIN) 100 MG capsule   baclofen (LIORESAL) 10 MG tablet   Other Relevant Orders   Ambulatory referral to Podiatry   Cervical spinal stenosis   Relevant Medications   gabapentin (NEURONTIN) 100 MG capsule   baclofen (LIORESAL) 10 MG tablet    Other Visit Diagnoses    Encounter to establish care with new doctor          Here to establish care - will request/review records from prior PCP and specialists on Chart.  #Chronic Neck Pain Cervical DJD / OA / Spinal Stenosis / DDD Chronic problem >3+ years, extensive prior work up treatment, including now establish with ortho / pain specialist (physiatry) has had MRI and multiple procedures injections, nerve ablation etc. - Reviewed documentation on chart - Today discussed future goals and plans, reviewed medication options to supplement her current procedural treatment at this time. - Start Gabapentin course 100mg  titration dose, as advised, for pain control, anticipate higher dose as tolerated, future reconsider other options including - Lyrica, TCA, Duloxetine SNRI, Tramadol - Start Baclofen muscle relaxant PRN 5-10mg  TID PRN caution sedation for neck  primarily - Also recommended proceed with chiropractor vs massager therapy, as options, she will look into these, she has completed PT regimen in past.  #Chronic R Hand/Thumb CMC Arthritis Pain Likely etiology based on history and evaluation on exam No sign of carpal tunnel, no sign of dequervain tendonitis or other etiology at this time. No trauma or injury R handed, repetitive strain on R hand with writing.  See above plan for pain in general Also  recommend restart Thumb Spica Splint / support when active Recommend topical Voltaren, she has already tried this. Start gabapentin for now Future consider orthopedic  #Left Foot Pain / Plantar fasciitis / Pes Planus Chronic problem >5 years, previous issue, now worsening lately. Referral to Podiatry [facility] now for further diagnostic / x-ray imaging and work up See above treatment plan as well to help this as well Continue on insoles   Meds ordered this encounter  Medications  . gabapentin (NEURONTIN) 100 MG capsule    Sig: Start 1 capsule daily, increase by 1 cap every 2-3 days as tolerated up to 3 times a day, or may take 3 at once in evening.    Dispense:  90 capsule    Refill:  1  . baclofen (LIORESAL) 10 MG tablet    Sig: Take 0.5-1 tablets (5-10 mg total) by mouth 3 (three) times daily as needed for muscle spasms.    Dispense:  30 each    Refill:  1      Follow up plan: Return in about 3 months (around 08/21/2020) for 3 month follow-up Neck Pain / Thumb / arthritis meds.  Nobie Putnam, [facility] 05/23/2020, 10:44 AM

## 2020-06-05 ENCOUNTER — Other Ambulatory Visit: Payer: Self-pay

## 2020-06-05 ENCOUNTER — Ambulatory Visit (INDEPENDENT_AMBULATORY_CARE_PROVIDER_SITE_OTHER): Payer: BC Managed Care – PPO

## 2020-06-05 ENCOUNTER — Ambulatory Visit (INDEPENDENT_AMBULATORY_CARE_PROVIDER_SITE_OTHER): Payer: BC Managed Care – PPO | Admitting: Podiatry

## 2020-06-05 DIAGNOSIS — M722 Plantar fascial fibromatosis: Secondary | ICD-10-CM | POA: Diagnosis not present

## 2020-06-05 MED ORDER — MELOXICAM 15 MG PO TABS: 15.0000 mg | ORAL_TABLET | Freq: Every day | ORAL | 3 refills | Status: DC

## 2020-06-05 MED ORDER — DEXAMETHASONE SODIUM PHOSPHATE 4 MG/ML IJ SOLN
4.0000 mg | Freq: Once | INTRAMUSCULAR | Status: AC
  Administered 2020-06-05: 4 mg

## 2020-06-05 MED ORDER — TRIAMCINOLONE ACETONIDE 40 MG/ML IJ SUSP
10.0000 mg | Freq: Once | INTRAMUSCULAR | Status: AC
  Administered 2020-06-05: 10 mg

## 2020-06-05 NOTE — Progress Notes (Signed)
  Subjective:  Patient ID: [name], female    DOB: [birth year],  MRN: [id number]  Chief Complaint  Patient presents with  . Plantar Fasciitis    Left heel pain ongoing for 4-5 years. Got worse in sept 2021. Sharp stabbing pain in the center of the heel. Wearing inserts but not helpful. Patient stated wearing a small heeled shoe gives temporary relief.     55 y.o. female presents with the above complaint. History confirmed with patient.  Started after tweaking it during a Zumba class many years ago.  It got significant worse after having Covid this fall.  She is also been having neck issues.  Pain is worse in the morning when she gets out of bed.  She has orthotics from her chiropractor  Objective:  Physical Exam: warm, good capillary refill, no trophic changes or ulcerative lesions, normal DP and PT pulses and normal sensory exam. Left Foot: point tenderness over the heel pad, mild gastrocnemius equinus  Radiographs: X-ray of the right foot: no fracture, dislocation, swelling or degenerative changes noted and plantar calcaneal spur is present  Assessment:   1. Plantar fasciitis      Plan:  Patient was evaluated and treated and all questions answered.   Discussed the etiology and treatment options for plantar fasciitis including stretching, formal physical therapy, supportive shoegears such as a running shoe or sneaker, pre fabricated orthoses, injection therapy, and oral medications. We also discussed the role of surgical treatment of this for patients who do not improve after exhausting non-surgical treatment options.   Plantar Fasciitis -XR reviewed with patient -Educated patient on stretching and icing of the affected limb -Night splint dispensed -Injection delivered to the plantar fascia of the left foot. -Rx for meloxicam. Educated on use, risks and benefits of the medication -Inspected her orthotics in her feet from her chiropractor, the appear to fit well I think  this will be a good long-term solution for her for prevention of recurrence, do not see current reason to adjust these or cast for new ones yet  After sterile prep with povidone-iodine solution and alcohol, the left heel was injected with 1cc 0.5% marcaine plain, 10mg  triamcinolone acetonide, and 4mg  dexamethasone was injected along the plantar fascia at the insertion on the plantar calcaneus. The patient tolerated the procedure well without complication.   Return in about 1 month (around 07/06/2020).

## 2020-07-11 DIAGNOSIS — M9901 Segmental and somatic dysfunction of cervical region: Secondary | ICD-10-CM | POA: Diagnosis not present

## 2020-07-11 DIAGNOSIS — M546 Pain in thoracic spine: Secondary | ICD-10-CM | POA: Diagnosis not present

## 2020-07-11 DIAGNOSIS — M9902 Segmental and somatic dysfunction of thoracic region: Secondary | ICD-10-CM | POA: Diagnosis not present

## 2020-07-11 DIAGNOSIS — M531 Cervicobrachial syndrome: Secondary | ICD-10-CM | POA: Diagnosis not present

## 2020-07-12 ENCOUNTER — Encounter: Payer: Self-pay | Admitting: Podiatry

## 2020-07-12 ENCOUNTER — Other Ambulatory Visit: Payer: Self-pay

## 2020-07-12 ENCOUNTER — Ambulatory Visit (INDEPENDENT_AMBULATORY_CARE_PROVIDER_SITE_OTHER): Payer: BC Managed Care – PPO | Admitting: Podiatry

## 2020-07-12 DIAGNOSIS — M722 Plantar fascial fibromatosis: Secondary | ICD-10-CM

## 2020-07-12 NOTE — Progress Notes (Signed)
  Subjective:  Patient ID: [name], female    DOB: Sep 13, 1965,  MRN: [id number]  Chief Complaint  Patient presents with  . Plantar Fasciitis    "its doing a lot better"    55 y.o. female returns with the above complaint. History confirmed with patient.  States she feels much better after the injection.  She is taking meloxicam about every other day at this point.  Rates her improvement is about 99%.  Occasional pain in the morning when she gets up but really not bad at all.  Objective:  Physical Exam: warm, good capillary refill, no trophic changes or ulcerative lesions, normal DP and PT pulses and normal sensory exam. Left Foot: Minimal pain today over the plantar heel at the insertion of the plantar fascia, mild gastrocnemius equinus  Radiographs: X-ray of the right foot: no fracture, dislocation, swelling or degenerative changes noted and plantar calcaneal spur is present  Assessment:   No diagnosis found.   Plan:  Patient was evaluated and treated and all questions answered.  She is doing much better status post injection therapy and anti-inflammatories.  Advised her she can take the meloxicam as needed at this point.  Advised to continue stretching and that was 1% better with no pain in flaring for at least 2 weeks.  Advised to gradually increase her activity level slowly since she does not have recurrence.   Return if symptoms worsen or fail to improve.

## 2020-07-21 DIAGNOSIS — E063 Autoimmune thyroiditis: Secondary | ICD-10-CM | POA: Diagnosis not present

## 2020-07-25 DIAGNOSIS — R635 Abnormal weight gain: Secondary | ICD-10-CM | POA: Diagnosis not present

## 2020-07-25 DIAGNOSIS — E063 Autoimmune thyroiditis: Secondary | ICD-10-CM | POA: Diagnosis not present

## 2020-08-15 DIAGNOSIS — M9901 Segmental and somatic dysfunction of cervical region: Secondary | ICD-10-CM | POA: Diagnosis not present

## 2020-08-15 DIAGNOSIS — M9902 Segmental and somatic dysfunction of thoracic region: Secondary | ICD-10-CM | POA: Diagnosis not present

## 2020-08-15 DIAGNOSIS — M546 Pain in thoracic spine: Secondary | ICD-10-CM | POA: Diagnosis not present

## 2020-08-15 DIAGNOSIS — M531 Cervicobrachial syndrome: Secondary | ICD-10-CM | POA: Diagnosis not present

## 2020-08-21 ENCOUNTER — Ambulatory Visit (INDEPENDENT_AMBULATORY_CARE_PROVIDER_SITE_OTHER): Payer: BC Managed Care – PPO | Admitting: Family Medicine

## 2020-08-21 ENCOUNTER — Other Ambulatory Visit: Payer: Self-pay

## 2020-08-21 ENCOUNTER — Other Ambulatory Visit: Payer: Self-pay | Admitting: Family Medicine

## 2020-08-21 ENCOUNTER — Encounter: Payer: Self-pay | Admitting: Family Medicine

## 2020-08-21 VITALS — BP 131/70 | HR 58 | Ht 66.0 in | Wt 181.0 lb

## 2020-08-21 DIAGNOSIS — Z1231 Encounter for screening mammogram for malignant neoplasm of breast: Secondary | ICD-10-CM | POA: Diagnosis not present

## 2020-08-21 DIAGNOSIS — M542 Cervicalgia: Secondary | ICD-10-CM | POA: Diagnosis not present

## 2020-08-21 DIAGNOSIS — M4802 Spinal stenosis, cervical region: Secondary | ICD-10-CM | POA: Diagnosis not present

## 2020-08-21 DIAGNOSIS — E039 Hypothyroidism, unspecified: Secondary | ICD-10-CM

## 2020-08-21 DIAGNOSIS — M4722 Other spondylosis with radiculopathy, cervical region: Secondary | ICD-10-CM | POA: Diagnosis not present

## 2020-08-21 DIAGNOSIS — R7309 Other abnormal glucose: Secondary | ICD-10-CM

## 2020-08-21 DIAGNOSIS — G8929 Other chronic pain: Secondary | ICD-10-CM

## 2020-08-21 DIAGNOSIS — Z0000 Encounter for general adult medical examination without abnormal findings: Secondary | ICD-10-CM

## 2020-08-21 MED ORDER — BACLOFEN 10 MG PO TABS: 5.0000 mg | ORAL_TABLET | Freq: Three times a day (TID) | ORAL | 3 refills | Status: DC | PRN

## 2020-08-21 MED ORDER — GABAPENTIN 100 MG PO CAPS: 100.0000 mg | ORAL_CAPSULE | Freq: Two times a day (BID) | ORAL | 1 refills | Status: DC | PRN

## 2020-08-21 NOTE — Patient Instructions (Addendum)
Thank you for coming to the office today.  For Mammogram screening for breast cancer   Call the [facility] below anytime to schedule your own appointment now that order has been placed.  [facility] [place], [place] [address] Phone: [phone]   Continue Gabapentin 1-2 pills nightly, adjust your dose as needed, if you prefer - let me know if need more of this medication, I have increased pill count.  Refilled Baclofen. Take as needed.  For Meloxicam - caution with this rx, take it only as needed if possible, can do 1-2 week every day then 1-2 week OFF. It can harm kidney stomach etc if taken long term.  Recommend to start taking Tylenol Extra Strength 500mg  tabs - take 1 to 2 tabs per dose (max 1000mg ) every 6-8 hours for pain (take regularly, don't skip a dose for next 7 days), max 24 hour daily dose is 6 tablets or 3000mg . In the future you can repeat the same everyday Tylenol course for 1-2 weeks at a time.      DUE for FASTING BLOOD WORK (no food or drink after midnight before the lab appointment, only water or coffee without cream/sugar on the morning of)  SCHEDULE "Lab Only" visit in the morning at the clinic for lab draw in 4 MONTHS   - Make sure Lab Only appointment is at about 1 week before your next appointment, so that results will be available  For Lab Results, once available within 2-3 days of blood draw, you can can log in to MyChart online to view your results and a brief explanation. Also, we can discuss results at next follow-up visit.    Please schedule a Follow-up Appointment to: Return in about 4 months (around 12/21/2020) for 4 month fasting lab only then 1 week later Annual Physical.  If you have any other questions or concerns, please feel free to call the office or send a message through [place]. You may also schedule an earlier appointment if necessary.  Additionally, you may be  receiving a survey about your experience at our office within a few days to 1 week by e-mail or mail. We value your feedback.  Nobie Putnam, DO [place]

## 2020-08-21 NOTE — Progress Notes (Signed)
Subjective:    Patient ID: [name], female    DOB: [birth year], 55 y.o.   MRN: [id number]  [name] is a 54 y.o. female presenting on 08/21/2020 for Arthritis and Neck Pain   HPI   Chronic Neck Pain  Background history Reports original onset injury about 3-4 years ago, doing yoga she had pinched nerves in neck, with pain radiating into Left arm from neck with severe pain and paresthesia. She was originally diagnosed with herniated discs in neck, she was treated with muscle relaxants, prednisone, muscle pain and other medications. She was referred to [facility] and also [facility] with Dr Sharlet Salina, they did MRI 03/2019. They have now proceeded to cortisone injections, then nerve blocks, then now has completed nerve ablation treatments. Mixed results so far. She is planning to avoid surgery in future.  - Last visit with me 05/2020, for initial visit for same problem Chronic Neck Pain / Arthritis, treated with gabapentin / baclofen trial, see prior notes for background information. - Interval update with notable improvement overall. - Today patient reports symptoms more "manageable" - Currently taking Gabapentin 100mg  capsule nightly regularly, she has not taken it more than once often, occasionally and it does help, but does admit slightly drowsy side effect. May increase dose in PM. - Taking Baclofen 5-10mg  as needed, only takes PRN for flares usually, worse with weather. Needs refills  Osteoarthritis other joints Chronic Foot Pain / Plantar Fasciitis Left Foot Pes Planus  Followed by Podiatry, had cortisone injection into foot and also taking Meloxicam 15mg  daily for this, some improvement.   Health Maintenance:  Due for Mammogram Screening ordered [place].  Depression screen [facility] 2/9 05/23/2020 02/24/2019 01/14/2019  Decreased Interest 0 0 0  Down, Depressed, Hopeless 0 0 1  PHQ - 2 Score 0 0 1  Altered sleeping - 0 3  Tired, decreased energy - 0  3  Change in appetite - 0 0  Feeling bad or failure about yourself  - 0 0  Trouble concentrating - 0 3  Moving slowly or fidgety/restless - 0 0  Suicidal thoughts - 0 0  PHQ-9 Score - 0 10  Difficult doing work/chores - - Not difficult at all    Social History   Tobacco Use  . Smoking status: Never Smoker  . Smokeless tobacco: Never Used  Substance Use Topics  . Alcohol use: No    Alcohol/week: 0.0 standard drinks  . Drug use: No    Review of Systems Per HPI unless specifically indicated above     Objective:    BP 131/70   Pulse (!) 58   Ht 5\' 6"  (1.676 m)   Wt 181 lb (82.1 kg)   LMP 12/12/2014   SpO2 98%   BMI 29.21 kg/m   Wt Readings from Last 3 Encounters:  08/21/20 181 lb (82.1 kg)  05/23/20 178 lb (80.7 kg)  04/15/20 178 lb (80.7 kg)    Physical Exam Vitals and nursing note reviewed.  Constitutional:      General: She is not in acute distress.    Appearance: She is well-developed. She is not diaphoretic.     Comments: Well-appearing, comfortable, cooperative  HENT:     Head: Normocephalic and atraumatic.  Eyes:     General:        Right eye: No discharge.        Left eye: No discharge.     Conjunctiva/sclera: Conjunctivae normal.  Cardiovascular:  Rate and Rhythm: Normal rate.  Pulmonary:     Effort: Pulmonary effort is normal.  Skin:    General: Skin is warm and dry.     Findings: No erythema or rash.  Neurological:     Mental Status: She is alert and oriented to person, place, and time.  Psychiatric:        Behavior: Behavior normal.     Comments: Well groomed, good eye contact, normal speech and thoughts        Results for orders placed or performed during the hospital encounter of 04/15/20  Resp Panel by RT-PCR (Flu A&B, Covid) Nasopharyngeal Swab   Specimen: Nasopharyngeal Swab; Nasopharyngeal(NP) swabs in vial transport medium  Result Value Ref Range   SARS Coronavirus 2 by RT PCR NEGATIVE NEGATIVE   Influenza A by PCR NEGATIVE  NEGATIVE   Influenza B by PCR NEGATIVE NEGATIVE      Assessment & Plan:   Problem List Items Addressed This Visit    Osteoarthritis of spine with radiculopathy, cervical region   Relevant Medications   gabapentin (NEURONTIN) 100 MG capsule   baclofen (LIORESAL) 10 MG tablet   Chronic neck pain   Relevant Medications   gabapentin (NEURONTIN) 100 MG capsule   baclofen (LIORESAL) 10 MG tablet   Cervical spinal stenosis   Relevant Medications   gabapentin (NEURONTIN) 100 MG capsule   baclofen (LIORESAL) 10 MG tablet       #Chronic Neck Pain Cervical DJD / OA / Spinal Stenosis / DDD Chronic problem >4+ years, extensive prior work up treatment, including now establish with ortho / pain specialist (physiatry) has had MRI and multiple procedures injections, nerve ablation etc.  Improved on current med management Refill Gabapentin, adjust dose 100mg  can take 1-2 per day, BID dosing or up to 200mg  nightly, inc pill count to 180 per 90 days. Can adjust further if need. Refill Baclofen muscle relaxant PRN Counseling on her existing Meloxicam 15mg  daily from Podiatry this should be PRN ONLY - 1-2 weeks at a time then phase off and take intermittently, review side effects of NSAIDs   Meds ordered this encounter  Medications  . gabapentin (NEURONTIN) 100 MG capsule    Sig: Take 1 capsule (100 mg total) by mouth 2 (two) times daily as needed.    Dispense:  180 capsule    Refill:  1  . baclofen (LIORESAL) 10 MG tablet    Sig: Take 0.5-1 tablets (5-10 mg total) by mouth 3 (three) times daily as needed for muscle spasms.    Dispense:  30 each    Refill:  3    Add refills      Follow up plan: Return in about 4 months (around 12/21/2020) for 4 month fasting lab only then 1 week later Annual Physical.  Future labs ordered for 12/18/20 - will hold on TSH thyroid lab, this is checked by Endocrinology   Nobie Putnam, [place]  [facility] 08/21/2020, 8:14 AM

## 2020-09-12 DIAGNOSIS — M9901 Segmental and somatic dysfunction of cervical region: Secondary | ICD-10-CM | POA: Diagnosis not present

## 2020-09-12 DIAGNOSIS — M531 Cervicobrachial syndrome: Secondary | ICD-10-CM | POA: Diagnosis not present

## 2020-09-12 DIAGNOSIS — M546 Pain in thoracic spine: Secondary | ICD-10-CM | POA: Diagnosis not present

## 2020-09-12 DIAGNOSIS — M9902 Segmental and somatic dysfunction of thoracic region: Secondary | ICD-10-CM | POA: Diagnosis not present

## 2020-09-24 ENCOUNTER — Other Ambulatory Visit: Payer: Self-pay | Admitting: Family Medicine

## 2020-09-24 DIAGNOSIS — M4722 Other spondylosis with radiculopathy, cervical region: Secondary | ICD-10-CM

## 2020-09-24 DIAGNOSIS — M542 Cervicalgia: Secondary | ICD-10-CM

## 2020-09-24 DIAGNOSIS — M4802 Spinal stenosis, cervical region: Secondary | ICD-10-CM

## 2020-09-24 DIAGNOSIS — G8929 Other chronic pain: Secondary | ICD-10-CM

## 2020-09-24 NOTE — Telephone Encounter (Signed)
Requested medication (s) are due for refill today: no  Requested medication (s) are on the active medication list: yes  Last refill:  08/21/20  Future visit scheduled: yes  Notes to clinic:  Sig in order history is different that requested prescription. Current SIG is 1 capsule 2 times daily. Requested SIG: 1 capsule three times daily or make take 3 capsules at once. Please review.  Requested Prescriptions  Pending Prescriptions Disp Refills   gabapentin (NEURONTIN) 100 MG capsule [Pharmacy Med Name: GABAPENTIN 100MG  CAPSULES] 90 capsule     Sig: TAKE 1 CAPSULE BY MOUTH DAILY, INCREASE BY 1 CAPSULE EVERY 2 TO 3 DAYS AS TOLERATED UP TO THREE TIMES DAILY, OR MAY TAKE 3 CAPSULES AT ONCE IN EVENING      Neurology: Anticonvulsants - gabapentin Passed - 09/24/2020  3:11 AM      Passed - Valid encounter within last 12 months    Recent Outpatient Visits           1 month ago Osteoarthritis of spine with radiculopathy, cervical region   [place], DO   4 months ago Osteoarthritis of spine with radiculopathy, cervical region   [place], DO   1 year ago Annual physical exam   [facility] Juline Patch, MD   1 year ago Cervical disc disorder with radiculopathy   [facility] Juline Patch, MD   1 year ago Cervical disc disorder with radiculopathy   [facility] Juline Patch, MD       Future Appointments             In 3 months Parks Ranger, Devonne Doughty, DO [facility], [facility]                 Requested Prescriptions  Pending Prescriptions Disp Refills   gabapentin (NEURONTIN) 100 MG capsule [Pharmacy Med Name: GABAPENTIN 100MG  CAPSULES] 90 capsule     Sig: TAKE 1 CAPSULE BY MOUTH DAILY, INCREASE BY 1 CAPSULE EVERY 2 TO 3 DAYS AS TOLERATED UP TO THREE TIMES DAILY, OR MAY TAKE 3 CAPSULES AT [place]      Neurology: Anticonvulsants -  gabapentin Passed - 09/24/2020  3:11 AM      Passed - Valid encounter within last 12 months    Recent Outpatient Visits           1 month ago Osteoarthritis of spine with radiculopathy, cervical region   [place], DO   4 months ago Osteoarthritis of spine with radiculopathy, cervical region   [place], DO   1 year ago Annual physical exam   [facility] Juline Patch, MD   1 year ago Cervical disc disorder with radiculopathy   [facility] Juline Patch, MD   1 year ago Cervical disc disorder with radiculopathy   [facility] Juline Patch, MD       Future Appointments             In 3 months Parks Ranger, Devonne Doughty, DO [facility], [facility]

## 2020-10-17 DIAGNOSIS — M9902 Segmental and somatic dysfunction of thoracic region: Secondary | ICD-10-CM | POA: Diagnosis not present

## 2020-10-17 DIAGNOSIS — M531 Cervicobrachial syndrome: Secondary | ICD-10-CM | POA: Diagnosis not present

## 2020-10-17 DIAGNOSIS — M9901 Segmental and somatic dysfunction of cervical region: Secondary | ICD-10-CM | POA: Diagnosis not present

## 2020-10-17 DIAGNOSIS — M546 Pain in thoracic spine: Secondary | ICD-10-CM | POA: Diagnosis not present

## 2020-11-14 DIAGNOSIS — E063 Autoimmune thyroiditis: Secondary | ICD-10-CM | POA: Diagnosis not present

## 2020-11-20 DIAGNOSIS — M9902 Segmental and somatic dysfunction of thoracic region: Secondary | ICD-10-CM | POA: Diagnosis not present

## 2020-11-20 DIAGNOSIS — M9901 Segmental and somatic dysfunction of cervical region: Secondary | ICD-10-CM | POA: Diagnosis not present

## 2020-11-20 DIAGNOSIS — M531 Cervicobrachial syndrome: Secondary | ICD-10-CM | POA: Diagnosis not present

## 2020-11-20 DIAGNOSIS — M546 Pain in thoracic spine: Secondary | ICD-10-CM | POA: Diagnosis not present

## 2020-11-21 DIAGNOSIS — E063 Autoimmune thyroiditis: Secondary | ICD-10-CM | POA: Diagnosis not present

## 2020-11-21 DIAGNOSIS — R635 Abnormal weight gain: Secondary | ICD-10-CM | POA: Diagnosis not present

## 2020-12-18 ENCOUNTER — Other Ambulatory Visit: Payer: BC Managed Care – PPO

## 2020-12-18 DIAGNOSIS — M4802 Spinal stenosis, cervical region: Secondary | ICD-10-CM | POA: Diagnosis not present

## 2020-12-18 DIAGNOSIS — M4722 Other spondylosis with radiculopathy, cervical region: Secondary | ICD-10-CM

## 2020-12-18 DIAGNOSIS — Z0000 Encounter for general adult medical examination without abnormal findings: Secondary | ICD-10-CM

## 2020-12-18 DIAGNOSIS — E039 Hypothyroidism, unspecified: Secondary | ICD-10-CM

## 2020-12-18 DIAGNOSIS — R7309 Other abnormal glucose: Secondary | ICD-10-CM

## 2020-12-19 ENCOUNTER — Other Ambulatory Visit: Payer: Self-pay

## 2020-12-19 LAB — CBC WITH DIFFERENTIAL/PLATELET
Absolute Monocytes: 282 cells/uL (ref 200–950)
Basophils Absolute: 79 cells/uL (ref 0–200)
Basophils Relative: 1.8 %
Eosinophils Absolute: 282 cells/uL (ref 15–500)
Eosinophils Relative: 6.4 %
HCT: 41.3 % (ref 35.0–45.0)
Hemoglobin: 13.7 g/dL (ref 11.7–15.5)
Lymphs Abs: 1377 cells/uL (ref 850–3900)
MCH: 30.6 pg (ref 27.0–33.0)
MCHC: 33.2 g/dL (ref 32.0–36.0)
MCV: 92.2 fL (ref 80.0–100.0)
MPV: 10.9 fL (ref 7.5–12.5)
Monocytes Relative: 6.4 %
Neutro Abs: 2380 cells/uL (ref 1500–7800)
Neutrophils Relative %: 54.1 %
Platelets: 170 10*3/uL (ref 140–400)
RBC: 4.48 10*6/uL (ref 3.80–5.10)
RDW: 12.5 % (ref 11.0–15.0)
Total Lymphocyte: 31.3 %
WBC: 4.4 10*3/uL (ref 3.8–10.8)

## 2020-12-19 LAB — COMPLETE METABOLIC PANEL WITH GFR
AG Ratio: 1.9 (calc) (ref 1.0–2.5)
ALT: 30 U/L — ABNORMAL HIGH (ref 6–29)
AST: 27 U/L (ref 10–35)
Albumin: 4.2 g/dL (ref 3.6–5.1)
Alkaline phosphatase (APISO): 66 U/L (ref 37–153)
BUN: 13 mg/dL (ref 7–25)
CO2: 29 mmol/L (ref 20–32)
Calcium: 9.3 mg/dL (ref 8.6–10.4)
Chloride: 106 mmol/L (ref 98–110)
Creat: 0.81 mg/dL (ref 0.50–1.03)
Globulin: 2.2 g/dL (calc) (ref 1.9–3.7)
Glucose, Bld: 85 mg/dL (ref 65–99)
Potassium: 3.9 mmol/L (ref 3.5–5.3)
Sodium: 142 mmol/L (ref 135–146)
Total Bilirubin: 0.4 mg/dL (ref 0.2–1.2)
Total Protein: 6.4 g/dL (ref 6.1–8.1)
eGFR: 85 mL/min/{1.73_m2} (ref >=60)

## 2020-12-19 LAB — HEMOGLOBIN A1C
Hgb A1c MFr Bld: 5 % of total Hgb (ref <5.7)
Mean Plasma Glucose: 97 mg/dL
eAG (mmol/L): 5.4 mmol/L

## 2020-12-19 LAB — LIPID PANEL
Cholesterol: 194 mg/dL (ref <200)
HDL: 56 mg/dL (ref >=50)
LDL Cholesterol (Calc): 115 mg/dL (calc) — ABNORMAL HIGH
Non-HDL Cholesterol (Calc): 138 mg/dL (calc) — ABNORMAL HIGH (ref <130)
Total CHOL/HDL Ratio: 3.5 (calc) (ref <5.0)
Triglycerides: 123 mg/dL (ref <150)

## 2020-12-21 DIAGNOSIS — M531 Cervicobrachial syndrome: Secondary | ICD-10-CM | POA: Diagnosis not present

## 2020-12-21 DIAGNOSIS — M9901 Segmental and somatic dysfunction of cervical region: Secondary | ICD-10-CM | POA: Diagnosis not present

## 2020-12-21 DIAGNOSIS — M9902 Segmental and somatic dysfunction of thoracic region: Secondary | ICD-10-CM | POA: Diagnosis not present

## 2020-12-21 DIAGNOSIS — M546 Pain in thoracic spine: Secondary | ICD-10-CM | POA: Diagnosis not present

## 2020-12-25 ENCOUNTER — Other Ambulatory Visit: Payer: Self-pay

## 2020-12-25 ENCOUNTER — Encounter: Payer: Self-pay | Admitting: Family Medicine

## 2020-12-25 ENCOUNTER — Ambulatory Visit (INDEPENDENT_AMBULATORY_CARE_PROVIDER_SITE_OTHER): Payer: BC Managed Care – PPO | Admitting: Family Medicine

## 2020-12-25 VITALS — BP 120/70 | HR 60 | Ht 66.0 in | Wt 181.0 lb

## 2020-12-25 DIAGNOSIS — Z0000 Encounter for general adult medical examination without abnormal findings: Secondary | ICD-10-CM

## 2020-12-25 NOTE — Patient Instructions (Addendum)
Thank you for coming to the office today.  For Mammogram screening for breast cancer   Call the [facility] below anytime to schedule your own appointment now that order has been placed.  [facility] [place], [place] [address] Phone: [phone]  -----------  Recent Labs    12/18/20 0826  HGBA1C 5.0   Mild elevated ALT 30 instead of < 29. This is very minimal not a concern. Likely due to mild elevated LDL cholesterol at 115  Your cardiovascular risk score is very low and not requiring any cholesterol medicine.  ------  Options for hot flashes.  Can increase the Gabapentin dosages to see if it helps with hot flashes.  Other options would be mood medication  Venlafaxine ( SNRI ) anti depressant Paxil (SSRI ) anti depressant  Other meds can be considered by Endocrinologist - Clonidine.   DUE for FASTING BLOOD WORK (no food or drink after midnight before the lab appointment, only water or coffee without cream/sugar on the morning of)  SCHEDULE "Lab Only" visit in the morning at the clinic for lab draw in 1 YEAR  - Make sure Lab Only appointment is at about 1 week before your next appointment, so that results will be available  For Lab Results, once available within 2-3 days of blood draw, you can can log in to MyChart online to view your results and a brief explanation. Also, we can discuss results at next follow-up visit.   Please schedule a Follow-up Appointment to: Return in about 1 year (around 12/25/2021) for 1 year fasting lab only then 1 week later Annual Physical.  If you have any other questions or concerns, please feel free to call the office or send a message through [place]. You may also schedule an earlier appointment if necessary.  Additionally, you may be receiving a survey about your experience at our office within a few days to 1 week by e-mail or mail. We value your  feedback.  Nobie Putnam, DO [place]

## 2020-12-25 NOTE — Progress Notes (Signed)
Subjective:    Patient ID: [name], female    DOB: [birth year], 55 y.o.   MRN: [id number]  [name] is a 55 y.o. female presenting on 12/25/2020 for Annual Exam   HPI  Here for Annual Physical and Lab Review.  Hypothyroidism Reviewed Endocrinology Claudia Hippo NP for thyroid, has had labs. Normal thyroid, continues on Amour Thyroid 57m daily. Admits brittle nails  HYPERLIPIDEMIA: - Reports no concerns. Last lipid panel 12/2020, controlled mostly on lifestyle Lifestyle - Diet: major reduced red meat diet and has improved overall - Exercise: goal to resume more  Chronic Neck Pain OA DDD Cervical Spine, Spinal Stenosis  Post Menopausal Syndrome Insomnia Admits difficulty sleeping last few weeks, usually it is related to anxiety and mind. She describes difficulty with night sweats. She has Gabapentin already for neck pain PRN.  History of COVID19 recently 1 month ago.  Health Maintenance: Flu shot in the Fall 2022. Through work  COVID19 vaccines updated  Shingrix 1 of 2, done 10/17/20  Colonoscopy 10/2019, Dr PHilarie Fredrickson- next due in 5 years. Initial colonoscopy had pre cancerous polyp then repeat in 5 yr had several polyps then had pre cancers, she returned in 3 years for 2021.  Due for mammogram screening last 2019, she admits needs to schedule it. [place] is already [facility]  [facility] 12/25/2020 05/23/2020 02/24/2019  Decreased Interest 0 0 0  Down, Depressed, Hopeless 0 0 0  PHQ - 2 Score 0 0 0  Altered sleeping 3 - 0  Tired, decreased energy 1 - 0  Change in appetite 3 - 0  Feeling bad or failure about yourself  0 - 0  Trouble concentrating 0 - 0  Moving slowly or fidgety/restless 0 - 0  Suicidal thoughts 0 - 0  PHQ-9 Score 7 - 0  Difficult doing work/chores Not difficult at all - -    Past Medical History:  Diagnosis Date   Abnormal Pap smear 2002   LGSIL   Allergy    Arthritis    neck   Constipation    1 bm every 3-4  days - hard stools    GERD (gastroesophageal reflux disease)    when on a medicine for uterine bleeding   Hemorrhoid    History of blood transfusion    for uterine bleeding; now resolved   Hypothyroidism    Shortness of breath dyspnea    with exercise since starting megace    Past Surgical History:  Procedure Laterality Date   BREAST BIOPSY Left 1995   neg   [place]  [place] breast b[place]   COLONOSCOPY     COLONOSCOPY W/ BIOPSIES  2013   divert. cleared for 5 yrs- [facility]   COLPOSCOPY  2002   ABNORMAL PAP   CYSTOSCOPY  06/06/2015   Procedure: CYSTOSCOPY;  Surgeon: UOsborne Oman MD;  [facility];  Service: Gynecology;;   DILATION AND CURETTAGE OF UTERUS     TAB   LAPAROSCOPIC VAGINAL HYSTERECTOMY WITH SALPINGO OOPHORECTOMY N/A 06/06/2015   Procedure: LAPAROSCOPIC ASSISTED VAGINAL HYSTERECTOMY ;  Surgeon: UOsborne Oman MD;  [facility];  Service: Gynecology;  Laterality: N/A;  722.9grams    LEEP  2002   POLYPECTOMY     TUBAL LIGATION     VAGINAL HYSTERECTOMY  05/2015   Social History   Socioeconomic History   Marital status: Married    Spouse name: Not on file  Number of children: 2   Years of education: Not on file   Highest education level: Not on file  Occupational History   Occupation: Engineer, mining: [employer]  Tobacco Use   Smoking status: Never   Smokeless tobacco: Never  Substance and Sexual Activity   Alcohol use: No    Alcohol/week: 0.0 standard drinks   Drug use: No   Sexual activity: Not Currently    Partners: Male    Birth control/protection: Surgical  Other Topics Concern   Not on file  Social History Narrative   Not on file   Social Determinants of Health   Financial Resource Strain: Not on file  Food Insecurity: Not on file  Transportation Needs: Not on file  Physical Activity: Not on file  Stress: Not on file  Social Connections: Not on file  Intimate Partner Violence: Not on file    Family History  Problem Relation Age of Onset   Hypertension Father    Diabetes Father    Cancer Maternal Grandmother        female cancer   Stroke Maternal Grandfather    Diabetes Paternal Aunt    Heart disease Paternal Aunt    Diabetes Paternal Uncle    Heart disease Paternal Uncle    Colon cancer Neg Hx    Esophageal cancer Neg Hx    Stomach cancer Neg Hx    Prostate cancer Neg Hx    Kidney cancer Neg Hx    Bladder Cancer Neg Hx    Breast cancer Neg Hx    Colon polyps Neg Hx    Current Outpatient Medications on File Prior to Visit  Medication Sig   Acetaminophen (TYLENOL ARTHRITIS PAIN PO) Take 2 tablets by mouth daily as needed (pain).   B COMPLEX-C PO Take by mouth.   baclofen (LIORESAL) 10 MG tablet Take 0.5-1 tablets (5-10 mg total) by mouth 3 (three) times daily as needed for muscle spasms.   diphenhydrAMINE (BENADRYL) 25 MG tablet Take 25 mg by mouth every 6 (six) hours as needed.   docusate sodium (COLACE) 250 MG capsule Take 250 mg by mouth daily.   EVENING PRIMROSE OIL PO Take by mouth.   gabapentin (NEURONTIN) 100 MG capsule Take 1 capsule (100 mg total) by mouth 2 (two) times daily as needed.   Glucos-Chond-Hyal Ac-Ca Fructo (MOVE FREE JOINT HEALTH ADVANCE) TABS Take by mouth.   ibuprofen (ADVIL) 200 MG tablet Take 200 mg by mouth every 6 (six) hours as needed. 400-600 mg prn   loratadine (CLARITIN) 10 MG tablet Take 10 mg by mouth daily as needed for allergies. otc   Melatonin 10 MG TABS Take by mouth.   meloxicam (MOBIC) 15 MG tablet Take 1 tablet (15 mg total) by mouth daily.   METRONIDAZOLE, TOPICAL, 0.75 % LOTN Apply 1 application topically 2 (two) times daily. (Patient taking differently: Apply 1 application topically daily as needed.)   Multiple Vitamins-Minerals (MULTIVITAMIN WITH MINERALS) tablet Take 1 tablet by mouth daily.   OVER THE COUNTER MEDICATION For sleep Camomile and Melatonin and another ingredient pt unsure of   OVER THE COUNTER MEDICATION  Brain performance supplement daily   Probiotic Product (ALIGN) 4 MG CAPS Take 1 capsule by mouth daily.   thyroid (ARMOUR) 60 MG tablet Take 1 tablet by mouth daily. [name]   Turmeric 500 MG CAPS Take by mouth.   [DISCONTINUED] montelukast (SINGULAIR) 10 MG tablet Take 1 tablet (10 mg total) by  mouth at bedtime.   No current facility-administered medications on file prior to visit.    Review of Systems  Constitutional:  Negative for activity change, appetite change, chills, diaphoresis, fatigue and fever.  HENT:  Negative for congestion and hearing loss.   Eyes:  Negative for visual disturbance.  Respiratory:  Negative for cough, chest tightness, shortness of breath and wheezing.   Cardiovascular:  Negative for chest pain, palpitations and leg swelling.  Gastrointestinal:  Negative for abdominal pain, constipation, diarrhea, nausea and vomiting.  Genitourinary:  Negative for dysuria, frequency and hematuria.  Musculoskeletal:  Negative for arthralgias and neck pain.  Skin:  Negative for rash.  Neurological:  Negative for dizziness, weakness, light-headedness, numbness and headaches.  Hematological:  Negative for adenopathy.  Psychiatric/Behavioral:  Negative for behavioral problems, dysphoric mood and sleep disturbance.   Per HPI unless specifically indicated above      Objective:    BP 120/70 (BP Location: Left Arm, Patient Position: Sitting, Cuff Size: Normal)   Pulse 60   Ht _0  (1.676 m)   Wt 181 lb (82.1 kg)   LMP 12/12/2014   SpO2 100%   BMI 29.21 kg/m   Wt Readings from Last 3 Encounters:  12/25/20 181 lb (82.1 kg)  08/21/20 181 lb (82.1 kg)  05/23/20 178 lb (80.7 kg)    Physical Exam Vitals and nursing note reviewed.  Constitutional:      General: She is not in acute distress.    Appearance: She is well-developed. She is not diaphoretic.     Comments: Well-appearing, comfortable, cooperative  HENT:     Head: Normocephalic and atraumatic.  Eyes:      General:        Right eye: No discharge.        Left eye: No discharge.     Conjunctiva/sclera: Conjunctivae normal.     Pupils: Pupils are equal, round, and reactive to light.  Neck:     Thyroid: No thyromegaly.  Cardiovascular:     Rate and Rhythm: Normal rate and regular rhythm.     Pulses: Normal pulses.     Heart sounds: Normal heart sounds. No murmur heard. Pulmonary:     Effort: Pulmonary effort is normal. No respiratory distress.     Breath sounds: Normal breath sounds. No wheezing or rales.  Abdominal:     General: Bowel sounds are normal. There is no distension.     Palpations: Abdomen is soft. There is no mass.     Tenderness: There is no abdominal tenderness.  Musculoskeletal:        General: No tenderness. Normal range of motion.     Cervical back: Normal range of motion and neck supple.     Comments: Upper / Lower Extremities: - Normal muscle tone, strength bilateral upper extremities 5/5, lower extremities 5/5  Lymphadenopathy:     Cervical: No cervical adenopathy.  Skin:    General: Skin is warm and dry.     Findings: No erythema or rash.  Neurological:     Mental Status: She is alert and oriented to person, place, and time.     Comments: Distal sensation intact to light touch all extremities  Psychiatric:        Mood and Affect: Mood normal.        Behavior: Behavior normal.        Thought Content: Thought content normal.     Comments: Well groomed, good eye contact, normal speech and thoughts   Results for  orders placed or performed in visit on 12/18/20  Lipid panel  Result Value Ref Range   Cholesterol 194 <200 mg/dL   HDL 56 > OR = 50 mg/dL   Triglycerides 123 <150 mg/dL   LDL Cholesterol (Calc) 115 (H) mg/dL (calc)   Total CHOL/HDL Ratio 3.5 <5.0 (calc)   Non-HDL Cholesterol (Calc) 138 (H) <130 mg/dL (calc)  COMPLETE METABOLIC PANEL WITH GFR  Result Value Ref Range   Glucose, Bld 85 65 - 99 mg/dL   BUN 13 7 - 25 mg/dL   Creat 0.81 0.50 - 1.03  mg/dL   eGFR 85 > OR = 60 mL/min/1.62m   BUN/Creatinine Ratio NOT APPLICABLE 6 - 22 (calc)   Sodium 142 135 - 146 mmol/L   Potassium 3.9 3.5 - 5.3 mmol/L   Chloride 106 98 - 110 mmol/L   CO2 29 20 - 32 mmol/L   Calcium 9.3 8.6 - 10.4 mg/dL   Total Protein 6.4 6.1 - 8.1 g/dL   Albumin 4.2 3.6 - 5.1 g/dL   Globulin 2.2 1.9 - 3.7 g/dL (calc)   AG Ratio 1.9 1.0 - 2.5 (calc)   Total Bilirubin 0.4 0.2 - 1.2 mg/dL   Alkaline phosphatase (APISO) 66 37 - 153 U/L   AST 27 10 - 35 U/L   ALT 30 (H) 6 - 29 U/L  CBC with Differential/Platelet  Result Value Ref Range   WBC 4.4 3.8 - 10.8 Thousand/uL   RBC 4.48 3.80 - 5.10 Million/uL   Hemoglobin 13.7 11.7 - 15.5 g/dL   HCT 41.3 35.0 - 45.0 %   MCV 92.2 80.0 - 100.0 fL   MCH 30.6 27.0 - 33.0 pg   MCHC 33.2 32.0 - 36.0 g/dL   RDW 12.5 11.0 - 15.0 %   Platelets 170 140 - 400 Thousand/uL   MPV 10.9 7.5 - 12.5 fL   Neutro Abs 2,380 1,500 - 7,800 cells/uL   Lymphs Abs 1,377 850 - 3,900 cells/uL   Absolute Monocytes 282 200 - 950 cells/uL   Eosinophils Absolute 282 15 - 500 cells/uL   Basophils Absolute 79 0 - 200 cells/uL   Neutrophils Relative % 54.1 %   Total Lymphocyte 31.3 %   Monocytes Relative 6.4 %   Eosinophils Relative 6.4 %   Basophils Relative 1.8 %  Hemoglobin A1c  Result Value Ref Range   Hgb A1c MFr Bld 5.0 <5.7 % of total Hgb   Mean Plasma Glucose 97 mg/dL   eAG (mmol/L) 5.4 mmol/L      Assessment & Plan:   Problem List Items Addressed This Visit   None Visit Diagnoses     Annual physical exam    -  Primary       Updated Health Maintenance information Reviewed recent lab results with patient Mild elevated LDL 115, low ASCVD ALT minimal elevated 30, likely from cholesterol, no treatment change Normal A1c Encouraged improvement to lifestyle with diet and exercise Goal of weight loss  Additional topic with post menopausal vasomotor symptoms - advised dose adjust existing gabapentin, can use OTC therapy /  hormonal , can f/u with Endocrine as well, consider other options in future SSRI Paxil or SNRI Venlafaxine likely (failed zoloft in past for side effects) or Clonidine.   No orders of the defined types were placed in this encounter.    Follow up plan: Return in about 1 year (around 12/25/2021) for 1 year fasting lab only then 1 week later Annual Physical.  ANobie Putnam DOliver  [facility] 12/25/2020, 4:02 PM

## 2021-01-25 DIAGNOSIS — M9902 Segmental and somatic dysfunction of thoracic region: Secondary | ICD-10-CM | POA: Diagnosis not present

## 2021-01-25 DIAGNOSIS — M9901 Segmental and somatic dysfunction of cervical region: Secondary | ICD-10-CM | POA: Diagnosis not present

## 2021-01-25 DIAGNOSIS — M546 Pain in thoracic spine: Secondary | ICD-10-CM | POA: Diagnosis not present

## 2021-01-25 DIAGNOSIS — M531 Cervicobrachial syndrome: Secondary | ICD-10-CM | POA: Diagnosis not present

## 2021-02-26 ENCOUNTER — Ambulatory Visit
Admission: RE | Admit: 2021-02-26 | Discharge: 2021-02-26 | Disposition: A | Discharge status: Home / Self Care | Payer: BC Managed Care – PPO | Source: Ambulatory Visit | Attending: Family Medicine | Admitting: Family Medicine

## 2021-02-26 ENCOUNTER — Other Ambulatory Visit: Payer: Self-pay

## 2021-02-26 DIAGNOSIS — Z1231 Encounter for screening mammogram for malignant neoplasm of breast: Secondary | ICD-10-CM

## 2021-03-01 DIAGNOSIS — M531 Cervicobrachial syndrome: Secondary | ICD-10-CM | POA: Diagnosis not present

## 2021-03-01 DIAGNOSIS — M9901 Segmental and somatic dysfunction of cervical region: Secondary | ICD-10-CM | POA: Diagnosis not present

## 2021-03-01 DIAGNOSIS — M9902 Segmental and somatic dysfunction of thoracic region: Secondary | ICD-10-CM | POA: Diagnosis not present

## 2021-03-01 DIAGNOSIS — M546 Pain in thoracic spine: Secondary | ICD-10-CM | POA: Diagnosis not present

## 2021-04-02 ENCOUNTER — Telehealth: Payer: Self-pay | Admitting: Family Medicine

## 2021-04-02 DIAGNOSIS — M546 Pain in thoracic spine: Secondary | ICD-10-CM | POA: Diagnosis not present

## 2021-04-02 DIAGNOSIS — M9902 Segmental and somatic dysfunction of thoracic region: Secondary | ICD-10-CM | POA: Diagnosis not present

## 2021-04-02 DIAGNOSIS — M531 Cervicobrachial syndrome: Secondary | ICD-10-CM | POA: Diagnosis not present

## 2021-04-02 DIAGNOSIS — M9901 Segmental and somatic dysfunction of cervical region: Secondary | ICD-10-CM | POA: Diagnosis not present

## 2021-04-02 NOTE — Telephone Encounter (Signed)
Referral Request - Has patient seen PCP for this complaint? Yes.   *If NO, is insurance requiring patient see PCP for this issue before PCP can refer them? Referral for which specialty: Ortho/Rheumatology Preferred provider/office: [facility] Reason for referral: Pain in right hand

## 2021-04-02 NOTE — Telephone Encounter (Signed)
Could you call patient and find out more information? This is not quite enough detail for me to place this referral.  Need to know more of what diagnosis we are talking about - if this was an injury or chronic pain. She has known arthritis and joint pains and also has seen [employer] previously.  Is she wanting referral to return to [facility] ortho? Or is she looking for something else?  Let me know and I can order. Otherwise may be easier to do a virtual or in person evaluation to answer all my questions first before referral.  Nobie Putnam, [facility] 04/02/2021, 11:03 AM

## 2021-04-03 DIAGNOSIS — C44519 Basal cell carcinoma of skin of other part of trunk: Secondary | ICD-10-CM | POA: Diagnosis not present

## 2021-04-03 DIAGNOSIS — L7211 Pilar cyst: Secondary | ICD-10-CM | POA: Diagnosis not present

## 2021-04-03 DIAGNOSIS — D485 Neoplasm of uncertain behavior of skin: Secondary | ICD-10-CM | POA: Diagnosis not present

## 2021-04-03 DIAGNOSIS — L578 Other skin changes due to chronic exposure to nonionizing radiation: Secondary | ICD-10-CM | POA: Diagnosis not present

## 2021-04-03 DIAGNOSIS — L2389 Allergic contact dermatitis due to other agents: Secondary | ICD-10-CM | POA: Diagnosis not present

## 2021-04-27 DIAGNOSIS — C44519 Basal cell carcinoma of skin of other part of trunk: Secondary | ICD-10-CM | POA: Diagnosis not present

## 2021-04-27 DIAGNOSIS — L988 Other specified disorders of the skin and subcutaneous tissue: Secondary | ICD-10-CM | POA: Diagnosis not present

## 2021-04-30 DIAGNOSIS — M531 Cervicobrachial syndrome: Secondary | ICD-10-CM | POA: Diagnosis not present

## 2021-04-30 DIAGNOSIS — M9902 Segmental and somatic dysfunction of thoracic region: Secondary | ICD-10-CM | POA: Diagnosis not present

## 2021-04-30 DIAGNOSIS — M9901 Segmental and somatic dysfunction of cervical region: Secondary | ICD-10-CM | POA: Diagnosis not present

## 2021-04-30 DIAGNOSIS — M546 Pain in thoracic spine: Secondary | ICD-10-CM | POA: Diagnosis not present

## 2021-05-15 ENCOUNTER — Other Ambulatory Visit: Payer: Self-pay

## 2021-05-15 ENCOUNTER — Ambulatory Visit
Admission: RE | Admit: 2021-05-15 | Discharge: 2021-05-15 | Disposition: A | Discharge status: Home / Self Care | Payer: BC Managed Care – PPO | Source: Ambulatory Visit | Attending: Family Medicine | Admitting: Family Medicine

## 2021-05-15 VITALS — BP 121/79 | HR 65 | Temp 98.0°F | Resp 16

## 2021-05-15 DIAGNOSIS — J0140 Acute pansinusitis, unspecified: Secondary | ICD-10-CM

## 2021-05-15 DIAGNOSIS — H7291 Unspecified perforation of tympanic membrane, right ear: Secondary | ICD-10-CM

## 2021-05-15 DIAGNOSIS — H65191 Other acute nonsuppurative otitis media, right ear: Secondary | ICD-10-CM | POA: Diagnosis not present

## 2021-05-15 MED ORDER — AZITHROMYCIN 250 MG PO TABS: ORAL_TABLET | ORAL | 0 refills | Status: DC

## 2021-05-15 MED ORDER — PREDNISONE 20 MG PO TABS: 20.0000 mg | ORAL_TABLET | Freq: Every day | ORAL | 0 refills | Status: AC

## 2021-05-15 NOTE — ED Provider Notes (Signed)
[facility]    CSN: [id number] Arrival date & time: 05/15/21  1149      History   Chief Complaint Chief Complaint  Patient presents with   Head Congestion    HPI Claudia HOLZMAN is a 56 y.o. female.   HPI Patient seen today for evaluation of facial pressure, head congestion following a flulike illness that she had for greater than a week ago.  She is afebrile.  Denies any worrisome symptoms such as shortness of breath, chest tightness or wheezing.  Patient denies a history of chronic allergies. She has been taking over the counter decongestant with minimal relief of symptoms.  Past Medical History:  Diagnosis Date   Abnormal Pap smear 2002   LGSIL   Allergy    Arthritis    neck   Constipation    1 bm every 3-4 days - hard stools    GERD (gastroesophageal reflux disease)    when on a medicine for uterine bleeding   Hemorrhoid    History of blood transfusion    for uterine bleeding; now resolved   Hypothyroidism    Shortness of breath dyspnea    with exercise since starting megace     Patient Active Problem List   Diagnosis Date Noted   Osteoarthritis of spine with radiculopathy, cervical region 05/23/2020   Chronic neck pain 05/23/2020   Cervical spinal stenosis 05/23/2020   Chronic heel pain, left 05/23/2020   Primary osteoarthritis of first carpometacarpal joint of right hand 05/23/2020   Chronic thumb pain, right 05/23/2020   Cervical disc disorder with radiculopathy 10/23/2017   Insomnia 08/24/2015   Stress incontinence in female 08/24/2015   FH: diabetes mellitus 08/24/2015   S/P laparoscopic assisted vaginal hysterectomy (LAVH) 06/06/2015   Rosacea 02/24/2015   Hypothyroidism 61/60/7371   Systolic murmur 11/06/9483   IBS (irritable bowel syndrome) 06/13/2011   Hemorrhoid 06/04/2011    Past Surgical History:  Procedure Laterality Date   BREAST BIOPSY Left 1995   neg   BREAST SURGERY  1994   left breast bx   COLONOSCOPY     COLONOSCOPY  W/ BIOPSIES  2013   divert. cleared for 5 yrs- [facility]   COLPOSCOPY  2002   ABNORMAL PAP   CYSTOSCOPY  06/06/2015   Procedure: CYSTOSCOPY;  Surgeon: Osborne Oman, MD;  Location: [facility];  Service: Gynecology;;   DILATION AND CURETTAGE OF UTERUS     TAB   LAPAROSCOPIC VAGINAL HYSTERECTOMY WITH SALPINGO OOPHORECTOMY N/A 06/06/2015   Procedure: LAPAROSCOPIC ASSISTED VAGINAL HYSTERECTOMY ;  Surgeon: Osborne Oman, MD;  Location: [facility];  Service: Gynecology;  Laterality: N/A;  722.9grams    LEEP  2002   POLYPECTOMY     TUBAL LIGATION     VAGINAL HYSTERECTOMY  05/2015    OB History     Gravida  3   Para  2   Term  2   Preterm      AB  1   Living  2      SAB      IAB      Ectopic      Multiple      Live Births  2            Home Medications    Prior to Admission medications   Medication Sig Start Date End Date Taking? Authorizing Provider  azithromycin (ZITHROMAX) 250 MG tablet Take 2 tabs PO x 1 dose, then 1 tab PO QD x  4 days 05/18/21  Yes Scot Jun, FNP  predniSONE (DELTASONE) 20 MG tablet Take 1 tablet (20 mg total) by mouth daily with breakfast for 5 days. 05/15/21 05/20/21 Yes Scot Jun, FNP  Acetaminophen (TYLENOL ARTHRITIS PAIN PO) Take 2 tablets by mouth daily as needed (pain).    [provider]  B COMPLEX-C PO Take by mouth.    [provider]  baclofen (LIORESAL) 10 MG tablet Take 0.5-1 tablets (5-10 mg total) by mouth 3 (three) times daily as needed for muscle spasms. 08/21/20   Karamalegos, Devonne Doughty, DO  diphenhydrAMINE (BENADRYL) 25 MG tablet Take 25 mg by mouth every 6 (six) hours as needed.    [provider]  docusate sodium (COLACE) 250 MG capsule Take 250 mg by mouth daily.    [provider]  EVENING PRIMROSE OIL PO Take by mouth.    [provider]  gabapentin (NEURONTIN) 100 MG capsule Take 1 capsule (100 mg total) by mouth 2 (two) times daily as needed. 08/21/20   Karamalegos,  Devonne Doughty, DO  Glucos-Chond-Hyal Ac-Ca Fructo (MOVE FREE JOINT HEALTH ADVANCE) TABS Take by mouth.    [provider]  ibuprofen (ADVIL) 200 MG tablet Take 200 mg by mouth every 6 (six) hours as needed. 400-600 mg prn    [provider]  loratadine (CLARITIN) 10 MG tablet Take 10 mg by mouth daily as needed for allergies. otc    [provider]  Melatonin 10 MG TABS Take by mouth.    [provider]  meloxicam (MOBIC) 15 MG tablet Take 1 tablet (15 mg total) by mouth daily. 06/05/20   McDonald, Adam R, DPM  METRONIDAZOLE, TOPICAL, 0.75 % LOTN Apply 1 application topically 2 (two) times daily. Patient taking differently: Apply 1 application topically daily as needed. 02/24/15   Plonk, Gwyndolyn Saxon, MD  Multiple Vitamins-Minerals (MULTIVITAMIN WITH MINERALS) tablet Take 1 tablet by mouth daily.    [provider]  OVER THE COUNTER MEDICATION For sleep Camomile and Melatonin and another ingredient pt unsure of    [provider]  OVER THE COUNTER MEDICATION Brain performance supplement daily    [provider]  Probiotic Product (ALIGN) 4 MG CAPS Take 1 capsule by mouth daily. 06/13/11   Pyrtle, Lajuan Lines, MD  thyroid (ARMOUR) 60 MG tablet Take 1 tablet by mouth daily. Warnell Forester 04/01/19   [provider]  Turmeric 500 MG CAPS Take by mouth.    [provider]  montelukast (SINGULAIR) 10 MG tablet Take 1 tablet (10 mg total) by mouth at bedtime. 02/24/19 04/15/20  Juline Patch, MD    Family History Family History  Problem Relation Age of Onset   Hypertension Father    Diabetes Father    Cancer Maternal Grandmother        female cancer   Stroke Maternal Grandfather    Diabetes Paternal Aunt    Heart disease Paternal Aunt    Diabetes Paternal Uncle    Heart disease Paternal Uncle    Colon cancer Neg Hx    Esophageal cancer Neg Hx    Stomach cancer Neg Hx    Prostate cancer Neg Hx    Kidney cancer Neg Hx     Bladder Cancer Neg Hx    Breast cancer Neg Hx    Colon polyps Neg Hx     Social History Social History   Tobacco Use   Smoking status: Never   Smokeless tobacco: Never  Vaping  Use   Vaping Use: Never used  Substance Use Topics   Alcohol use: No    Alcohol/week: 0.0 standard drinks   Drug use: No     Allergies   Doxycycline, Ciprofloxacin, and Sulfa antibiotics   Review of Systems Review of Systems Pertinent negatives listed in HPI.  Physical Exam Triage Vital Signs ED Triage Vitals  Enc Vitals Group     BP 05/15/21 1213 121/79     Pulse Rate 05/15/21 1213 65     Resp 05/15/21 1213 16     Temp 05/15/21 1213 98 F (36.7 C)     Temp Source 05/15/21 1213 Oral     SpO2 05/15/21 1213 96 %     Weight --      Height --      Head Circumference --      Peak Flow --      Pain Score 05/15/21 1210 0     Pain Loc --      Pain Edu? --      Excl. in [place]? --    No data found.  Updated Vital Signs BP 121/79 (BP Location: Left Arm)    Pulse 65    Temp 98 F (36.7 C) (Oral)    Resp 16    LMP 12/12/2014    SpO2 96%   Visual Acuity Right Eye Distance:   Left Eye Distance:   Bilateral Distance:    Right Eye Near:   Left Eye Near:    Bilateral Near:     Physical Exam  General Appearance:    Alert, cooperative, no distress  HENT:   Normocephalic, ears normal, nares mucosal edema with congestion, rhinorrhea, oropharynx patent without exudate or swelling   Eyes:    PERRL, conjunctiva/corneas clear, EOM's intact       Lungs:     Clear to auscultation bilaterally, respirations unlabored  Heart:    Regular rate and rhythm  Neurologic:   Awake, alert, oriented x 3. No apparent focal neurological           defect.      UC Treatments / Results  Labs (all labs ordered are listed, but only abnormal results are displayed) Labs Reviewed - No data to display  EKG   Radiology No results found.  Procedures Procedures (including critical care time)  Medications Ordered in  UC Medications - No data to display  Initial Impression / Assessment and Plan / UC Course  I have reviewed the triage vital signs and the nursing notes.  Pertinent labs & imaging results that were available during my care of the patient were reviewed by me and considered in my medical decision making (see chart for details).    Acute non recurrent pansinusitis with perforated TM and effusion. Treatment with prednisone 20 mg daily for 5 days if no improvement within 3 days start Azithromycin.  RTC as needed.  Final Clinical Impressions(s) / UC Diagnoses   Final diagnoses:  Acute non-recurrent pansinusitis  Acute effusion of right ear  Perforated tympanic membrane on examination, right     Discharge Instructions      Prednisone 20 mg x 5 days take for 3 days. If no improvement, start Azithromycin.      ED Prescriptions     Medication Sig Dispense Auth. Provider   predniSONE (DELTASONE) 20 MG tablet Take 1 tablet (20 mg total) by mouth daily with breakfast for 5 days. 5 tablet Scot Jun, FNP   azithromycin (ZITHROMAX) 250 MG  tablet Take 2 tabs PO x 1 dose, then 1 tab PO QD x 4 days 6 tablet Scot Jun, FNP      PDMP not reviewed this encounter.   Scot Jun, FNP 05/15/21 1316

## 2021-05-15 NOTE — ED Triage Notes (Signed)
Pt was sick with flu like symptoms last week. Those symptoms subsided, but she now has head congestion and sinus pressure.

## 2021-05-15 NOTE — Discharge Instructions (Signed)
Prednisone 20 mg x 5 days take for 3 days. If no improvement, start Azithromycin.

## 2021-05-18 ENCOUNTER — Encounter: Payer: Self-pay | Admitting: Family Medicine

## 2021-06-15 ENCOUNTER — Ambulatory Visit (INDEPENDENT_AMBULATORY_CARE_PROVIDER_SITE_OTHER): Payer: BC Managed Care – PPO | Admitting: Family Medicine

## 2021-06-15 ENCOUNTER — Encounter: Payer: Self-pay | Admitting: Family Medicine

## 2021-06-15 ENCOUNTER — Other Ambulatory Visit: Payer: Self-pay

## 2021-06-15 VITALS — BP 122/72 | HR 67 | Ht 66.0 in | Wt 185.6 lb

## 2021-06-15 DIAGNOSIS — G8929 Other chronic pain: Secondary | ICD-10-CM | POA: Diagnosis not present

## 2021-06-15 DIAGNOSIS — M4802 Spinal stenosis, cervical region: Secondary | ICD-10-CM | POA: Diagnosis not present

## 2021-06-15 DIAGNOSIS — M79644 Pain in right finger(s): Secondary | ICD-10-CM | POA: Diagnosis not present

## 2021-06-15 DIAGNOSIS — M1811 Unilateral primary osteoarthritis of first carpometacarpal joint, right hand: Secondary | ICD-10-CM | POA: Diagnosis not present

## 2021-06-15 MED ORDER — GABAPENTIN 300 MG PO CAPS: 300.0000 mg | ORAL_CAPSULE | Freq: Two times a day (BID) | ORAL | 3 refills | Status: DC

## 2021-06-15 MED ORDER — MELOXICAM 15 MG PO TABS: 15.0000 mg | ORAL_TABLET | Freq: Every day | ORAL | 2 refills | Status: DC | PRN

## 2021-06-15 NOTE — Progress Notes (Signed)
Subjective:    Patient ID: [name], female    DOB: [birth year], 56 y.o.   MRN: [id number]  [name] is a 56 y.o. 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 She has tried Estate agent Splint for R thumb. Tried Voltaren topical rx using up to 4 times day without relief Uses Tylenol Arthritis, Motrin limited options. But does get some relief. She will take Tylenol 650 x 2 once daily and Advil 653m daily some relief.  Taking Gabapentin 1041mBID for her neck. Not helping thumb  Has seen Ortho for neck but not thumb.  Asking about rheumatological testing. Has had some swelling in thumb joint.  Describes pain impacting her Every single day and every task, any pressure involved in activity such as dressing, lifting, carrying, opening jar, unhooking bra etc. It is impacting her work and daily function  Known history of OA/DJD Spinal Stenosis cervical and other joint issues see PMH   [facility] 06/15/2021 12/25/2020 05/23/2020  Decreased Interest 1 0 0  Down, Depressed, Hopeless 0 0 0  PHQ - 2 Score 1 0 0  Altered sleeping 3 3 -  Tired, decreased energy 3 1 -  Change in appetite 3 3 -  Feeling bad or failure about yourself  0 0 -  Trouble concentrating 3 0 -  Moving slowly or fidgety/restless 0 0 -  Suicidal thoughts 0 0 -  PHQ-9 Score 13 7 -  Difficult doing work/chores Not difficult at all Not difficult at all -    Social History   Tobacco Use   Smoking status: Never   Smokeless tobacco: Never  Vaping Use   Vaping Use: Never used  Substance Use Topics   Alcohol use: No    Alcohol/week: 0.0 standard drinks   Drug use: No    Review of Systems Per HPI unless specifically indicated above     Objective:     BP 122/72    Pulse 67    Ht '5\' 6"'  (1.676 m)    Wt 185 lb 9.6 oz (84.2 kg)    LMP 12/12/2014    SpO2 97%    BMI 29.96 kg/m   Wt Readings from Last 3 Encounters:  06/15/21 185 lb 9.6 oz (84.2 kg)  12/25/20 181 lb (82.1 kg)  08/21/20 181 lb (82.1 kg)    Physical Exam Vitals and nursing note reviewed.  Constitutional:      General: She is not in acute distress.    Appearance: Normal appearance. She is well-developed. She is not diaphoretic.     Comments: Well-appearing, comfortable, cooperative  HENT:     Head: Normocephalic and atraumatic.  Eyes:     General:        Right eye: No discharge.        Left eye: No discharge.     Conjunctiva/sclera: Conjunctivae normal.  Cardiovascular:     Rate and Rhythm: Normal rate.  Pulmonary:     Effort: Pulmonary effort is normal.  Musculoskeletal:     Comments: Right Thumb CMC joint with some bulkiness, tenderness over thenar eminence, [facility], and PIP joint bulkiness. No other joint inflamed  Skin:    General: Skin is warm and dry.  Findings: No erythema or rash.  Neurological:     Mental Status: She is alert and oriented to person, place, and time.  Psychiatric:        Mood and Affect: Mood normal.        Behavior: Behavior normal.        Thought Content: Thought content normal.     Comments: Well groomed, good eye contact, normal speech and thoughts     Results for orders placed or performed in visit on 12/18/20  Lipid panel  Result Value Ref Range   Cholesterol 194 <200 mg/dL   HDL 56 > OR = 50 mg/dL   Triglycerides 123 <150 mg/dL   LDL Cholesterol (Calc) 115 (H) mg/dL (calc)   Total CHOL/HDL Ratio 3.5 <5.0 (calc)   Non-HDL Cholesterol (Calc) 138 (H) <130 mg/dL (calc)  COMPLETE METABOLIC PANEL WITH GFR  Result Value Ref Range   Glucose, Bld 85 65 - 99 mg/dL   BUN 13 7 - 25 mg/dL   Creat 0.81 0.50 - 1.03 mg/dL   eGFR 85 > OR = 60 mL/min/1.65m   BUN/Creatinine Ratio NOT APPLICABLE 6 - 22 (calc)   Sodium  142 135 - 146 mmol/L   Potassium 3.9 3.5 - 5.3 mmol/L   Chloride 106 98 - 110 mmol/L   CO2 29 20 - 32 mmol/L   Calcium 9.3 8.6 - 10.4 mg/dL   Total Protein 6.4 6.1 - 8.1 g/dL   Albumin 4.2 3.6 - 5.1 g/dL   Globulin 2.2 1.9 - 3.7 g/dL (calc)   AG Ratio 1.9 1.0 - 2.5 (calc)   Total Bilirubin 0.4 0.2 - 1.2 mg/dL   Alkaline phosphatase (APISO) 66 37 - 153 U/L   AST 27 10 - 35 U/L   ALT 30 (H) 6 - 29 U/L  CBC with Differential/Platelet  Result Value Ref Range   WBC 4.4 3.8 - 10.8 Thousand/uL   RBC 4.48 3.80 - 5.10 Million/uL   Hemoglobin 13.7 11.7 - 15.5 g/dL   HCT 41.3 35.0 - 45.0 %   MCV 92.2 80.0 - 100.0 fL   MCH 30.6 27.0 - 33.0 pg   MCHC 33.2 32.0 - 36.0 g/dL   RDW 12.5 11.0 - 15.0 %   Platelets 170 140 - 400 Thousand/uL   MPV 10.9 7.5 - 12.5 fL   Neutro Abs 2,380 1,500 - 7,800 cells/uL   Lymphs Abs 1,377 850 - 3,900 cells/uL   Absolute Monocytes 282 200 - 950 cells/uL   Eosinophils Absolute 282 15 - 500 cells/uL   Basophils Absolute 79 0 - 200 cells/uL   Neutrophils Relative % 54.1 %   Total Lymphocyte 31.3 %   Monocytes Relative 6.4 %   Eosinophils Relative 6.4 %   Basophils Relative 1.8 %  Hemoglobin A1c  Result Value Ref Range   Hgb A1c MFr Bld 5.0 <5.7 % of total Hgb   Mean Plasma Glucose 97 mg/dL   eAG (mmol/L) 5.4 mmol/L      Assessment & Plan:   Problem List Items Addressed This Visit     Primary osteoarthritis of first carpometacarpal joint of right hand   Relevant Medications   meloxicam (MOBIC) 15 MG tablet   gabapentin (NEURONTIN) 300 MG capsule   Cervical spinal stenosis   Relevant Medications   gabapentin (NEURONTIN) 300 MG capsule   Other Visit Diagnoses     Chronic pain of right thumb    -  Primary   Relevant Medications   meloxicam (MOBIC) 15  MG tablet   gabapentin (NEURONTIN) 300 MG capsule   Other Relevant Orders   Sed Rate (ESR)   C-reactive protein   Cyclic citrul peptide antibody, IgG   Rheumatoid Factor   ANA   DG Finger Thumb  Right       Labs today for rheumatoid  X-ray next week, walk in first come first serve Mon-Thurs, not Fri, 8am to 1130am, and 130pm to 430pm  Start Meloxicam 13m dailiy anti inflammatory, use 1-2 weeks at a time, can hold for 1-2 week after  Hold ibuprofen while on that one.  Recommend to start taking Tylenol Extra Strength 503mtabs - take 1 to 2 tabs per dose (max 100043mevery 6-8 hours for pain (take regularly, don't skip a dose for next 7 days), max 24 hour daily dose is 6 tablets or 3000m74mn the future you can repeat the same everyday Tylenol course for 1-2 weeks at a time.  - This is safe to take with anti-inflammatory medicines (Ibuprofen, Advil, Naproxen, Aleve, Meloxicam, Mobic)    START anti inflammatory topical - OTC Voltaren (generic Diclofenac) topical 2-4 times a day as needed for pain swelling of affected joint for 1-2 weeks or longer.   Increase Gabapentin, gradually increase to 300mg33me dose a day. Can increase up to twice a day.  - In the future if needed, we can significantly increase the dose if tolerated well, some common doses are 300mg 76me times a day up to 600mg t75m times a day, usually it takes several weeks or months to get to higher doses  If indicated by labs / imaging or other issues, can refer to Rheum vs Orthopedics hand specialist for possible thumb CMC injection or other therapy.  Meds ordered this encounter  Medications   meloxicam (MOBIC) 15 MG tablet    Sig: Take 1 tablet (15 mg total) by mouth daily as needed for pain.    Dispense:  30 tablet    Refill:  2   gabapentin (NEURONTIN) 300 MG capsule    Sig: Take 1 capsule (300 mg total) by mouth 2 (two) times daily.    Dispense:  180 capsule    Refill:  3    Dose increase      Follow up plan: Return in about 3 months (around 09/12/2021) for 3 month follow-up in person or virtual Arthritis / Thumb Pain.   [facility] 06/15/2021, 2:27 PM

## 2021-06-15 NOTE — Patient Instructions (Addendum)
Thank you for coming to the office today.  Labs today for rheumatoid  X-ray next week, walk in first come first serve Mon-Thurs, not Fri, 8am to 1130am, and 130pm to 430pm  Start Meloxicam 15mg  dailiy anti inflammatory, use 1-2 weeks at a time, can hold for 1-2 week after  Hold ibuprofen while on that one.  Recommend to start taking Tylenol Extra Strength 500mg  tabs - take 1 to 2 tabs per dose (max 1000mg ) every 6-8 hours for pain (take regularly, don't skip a dose for next 7 days), max 24 hour daily dose is 6 tablets or 3000mg . In the future you can repeat the same everyday Tylenol course for 1-2 weeks at a time.  - This is safe to take with anti-inflammatory medicines (Ibuprofen, Advil, Naproxen, Aleve, Meloxicam, Mobic)    START anti inflammatory topical - OTC Voltaren (generic Diclofenac) topical 2-4 times a day as needed for pain swelling of affected joint for 1-2 weeks or longer.   Increase Gabapentin, gradually increase to 300mg   one dose a day. Can increase up to twice a day.  - In the future if needed, we can significantly increase the dose if tolerated well, some common doses are 300mg  three times a day up to 600mg  three times a day, usually it takes several weeks or months to get to higher doses   Please schedule a Follow-up Appointment to: Return in about 3 months (around 09/12/2021) for 3 month follow-up in person or virtual Arthritis / Thumb Pain.  If you have any other questions or concerns, please feel free to call the office or send a message through [place]. You may also schedule an earlier appointment if necessary.  Additionally, you may be receiving a survey about your experience at our office within a few days to 1 week by e-mail or mail. We value your feedback.  Nobie Putnam, DO [place]

## 2021-06-18 ENCOUNTER — Other Ambulatory Visit: Payer: Self-pay

## 2021-06-18 ENCOUNTER — Ambulatory Visit
Admission: RE | Admit: 2021-06-18 | Discharge: 2021-06-18 | Disposition: A | Discharge status: Home / Self Care | Payer: BC Managed Care – PPO | Source: Home / Self Care | Attending: Family Medicine | Admitting: Family Medicine

## 2021-06-18 ENCOUNTER — Ambulatory Visit
Admission: RE | Admit: 2021-06-18 | Discharge: 2021-06-18 | Disposition: A | Discharge status: Home / Self Care | Payer: BC Managed Care – PPO | Source: Ambulatory Visit | Attending: Family Medicine | Admitting: Family Medicine

## 2021-06-18 DIAGNOSIS — G8929 Other chronic pain: Secondary | ICD-10-CM | POA: Diagnosis not present

## 2021-06-18 DIAGNOSIS — M189 Osteoarthritis of first carpometacarpal joint, unspecified: Secondary | ICD-10-CM | POA: Diagnosis not present

## 2021-06-18 DIAGNOSIS — M79644 Pain in right finger(s): Secondary | ICD-10-CM | POA: Diagnosis not present

## 2021-06-18 DIAGNOSIS — M1811 Unilateral primary osteoarthritis of first carpometacarpal joint, right hand: Secondary | ICD-10-CM

## 2021-06-18 LAB — CYCLIC CITRUL PEPTIDE ANTIBODY, IGG: Cyclic Citrullin Peptide Ab: <16 UNITS

## 2021-06-18 LAB — RHEUMATOID FACTOR: Rheumatoid fact SerPl-aCnc: <14 IU/mL (ref <14)

## 2021-06-18 LAB — ANA: Anti Nuclear Antibody (ANA): NEGATIVE

## 2021-06-18 LAB — SEDIMENTATION RATE: Sed Rate: 9 mm/h (ref 0–30)

## 2021-06-18 LAB — C-REACTIVE PROTEIN: CRP: 2.3 mg/L (ref <8.0)

## 2021-06-19 DIAGNOSIS — E063 Autoimmune thyroiditis: Secondary | ICD-10-CM | POA: Diagnosis not present

## 2021-06-26 DIAGNOSIS — E063 Autoimmune thyroiditis: Secondary | ICD-10-CM | POA: Diagnosis not present

## 2021-07-03 DIAGNOSIS — M546 Pain in thoracic spine: Secondary | ICD-10-CM | POA: Diagnosis not present

## 2021-07-03 DIAGNOSIS — M9902 Segmental and somatic dysfunction of thoracic region: Secondary | ICD-10-CM | POA: Diagnosis not present

## 2021-07-03 DIAGNOSIS — M9901 Segmental and somatic dysfunction of cervical region: Secondary | ICD-10-CM | POA: Diagnosis not present

## 2021-07-03 DIAGNOSIS — M531 Cervicobrachial syndrome: Secondary | ICD-10-CM | POA: Diagnosis not present

## 2021-07-10 DIAGNOSIS — L7211 Pilar cyst: Secondary | ICD-10-CM | POA: Diagnosis not present

## 2021-09-11 IMAGING — MR MR CERVICAL SPINE W/O CM
5 series · 29 of 48 positions shown · non-contrast
Comparison: None.

CLINICAL DATA: Cervical pain for 1.5 years. Pain runs from the neck
into the left arm.

EXAM:
MRI CERVICAL SPINE WITHOUT CONTRAST
TECHNIQUE: Multiplanar, multisequence MR imaging of the cervical spine was
performed. No intravenous contrast was administered.

[Series 3: T2 · sagittal · 3.0mm · 0.41mm/px · 6 of 13 slices shown (1 of 2)]
[im 1/13]
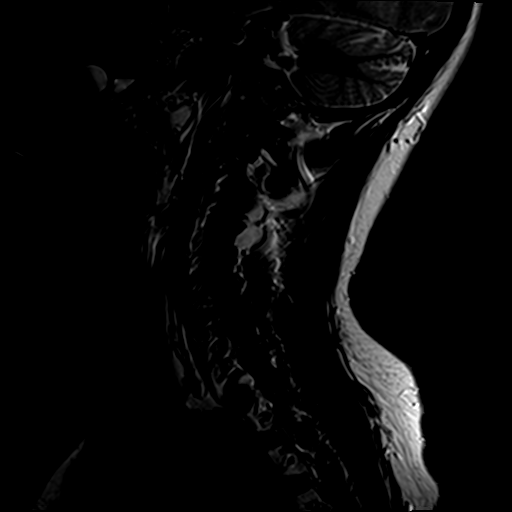
[im 3/13]
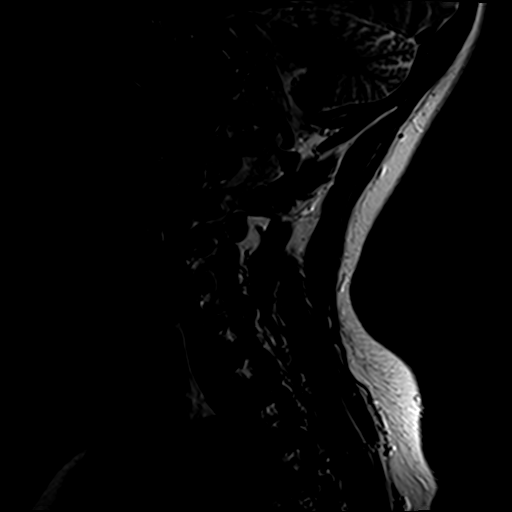
[im 5/13]
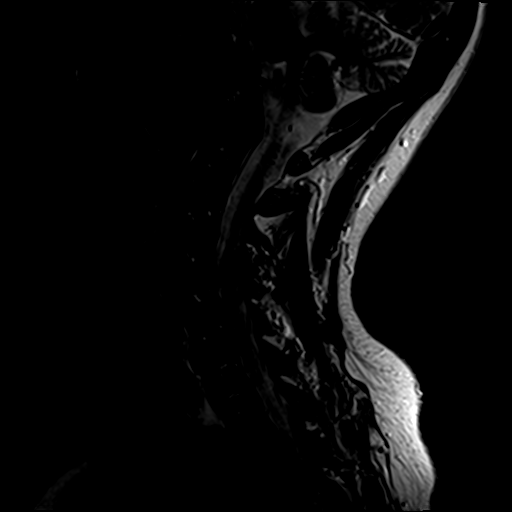
[im 8/13]
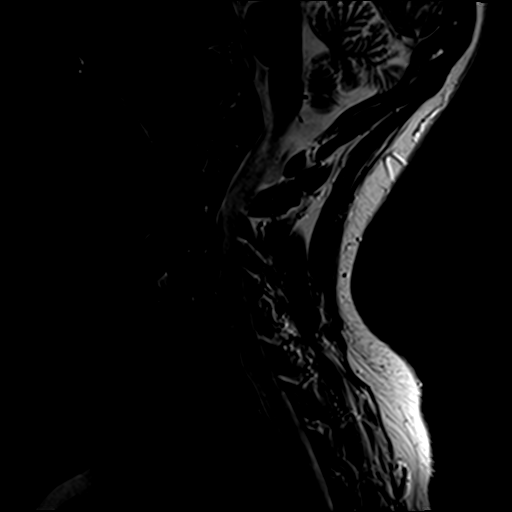
[im 10/13]
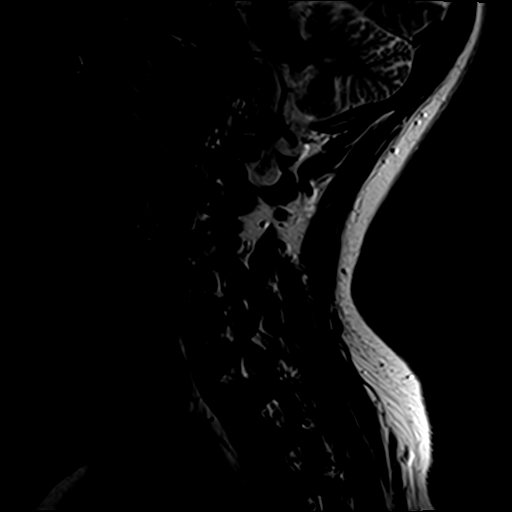
[im 13/13]
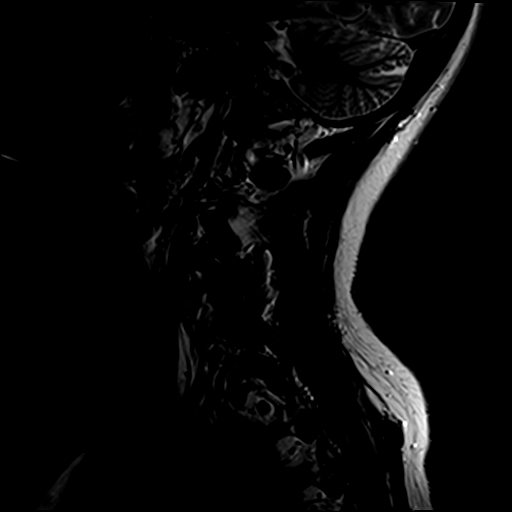

[Series 4: T1 · sagittal · 3.0mm · 0.41mm/px · 7 of 13 slices shown]
[im 1/13]
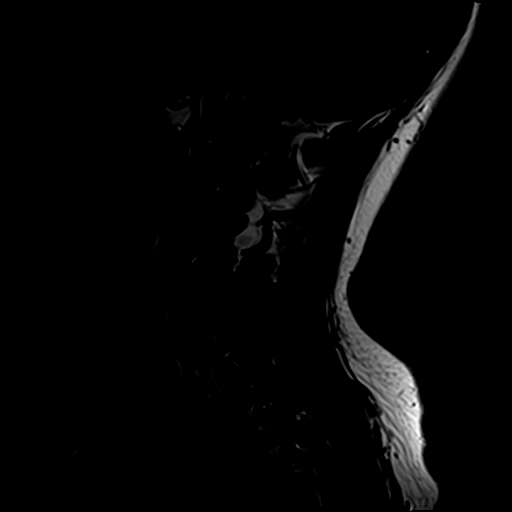
[im 3/13]
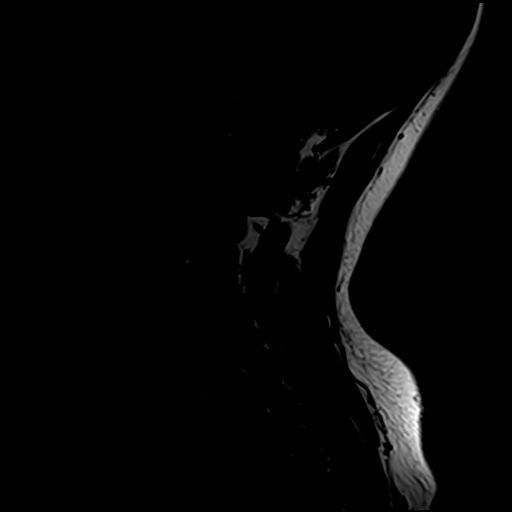
[im 5/13]
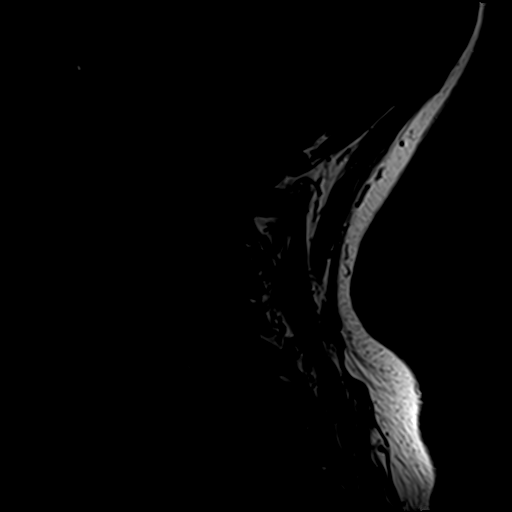
[im 7/13]
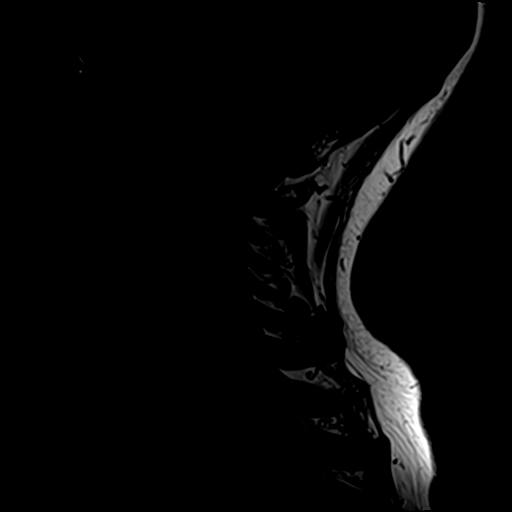
[im 9/13]
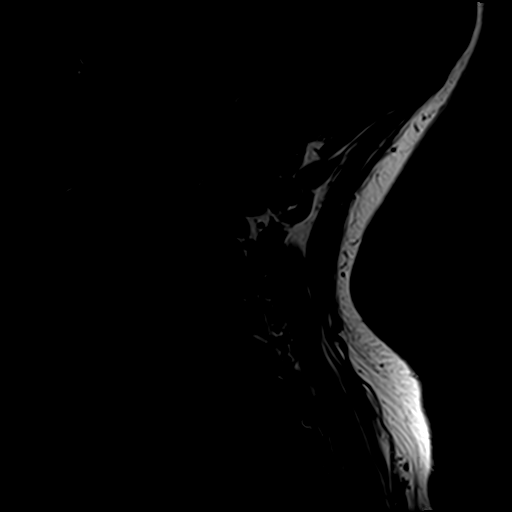
[im 11/13]
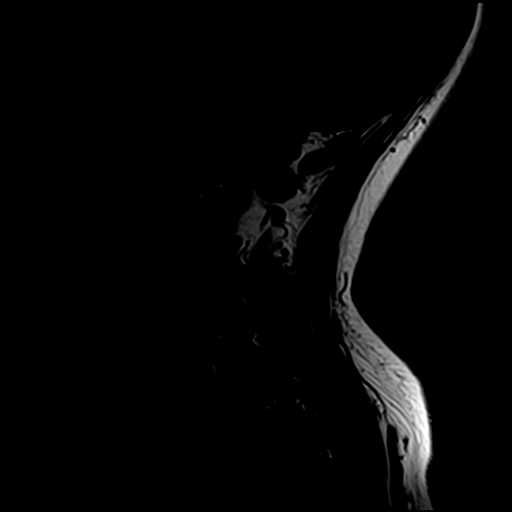
[im 13/13]
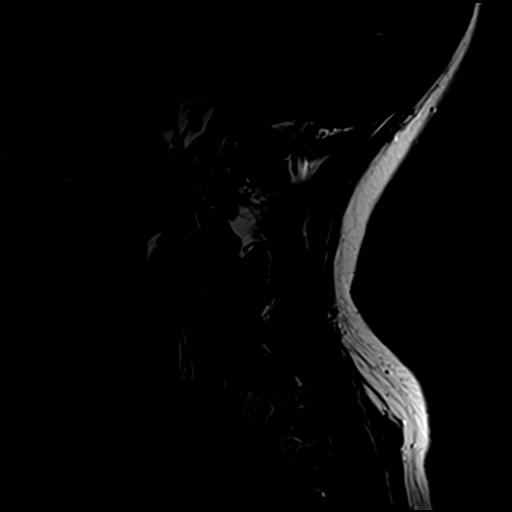

[Series 5: STIR · sagittal · 3.0mm · 0.82mm/px · 7 of 13 slices shown]
[im 1/13]
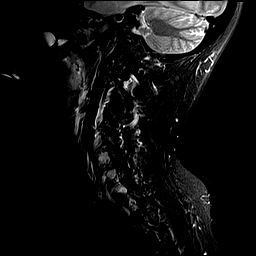
[im 3/13]
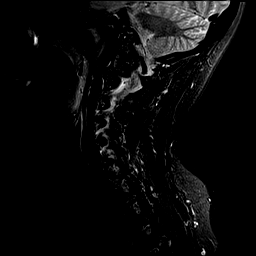
[im 5/13]
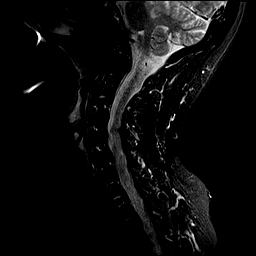
[im 7/13]
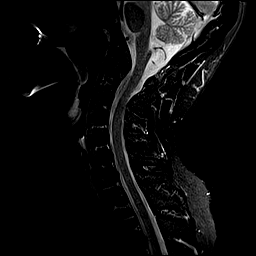
[im 9/13]
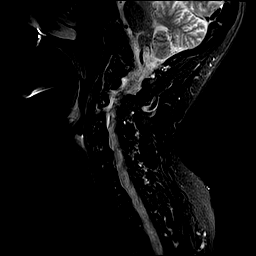
[im 11/13]
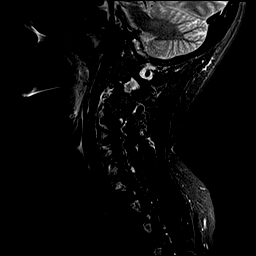
[im 13/13]
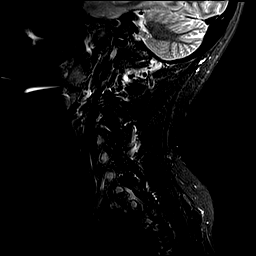

[Series 6: GRE · axial · 3.0mm · 0.35mm/px · 1 of 26 slices shown]
[im 1/26]
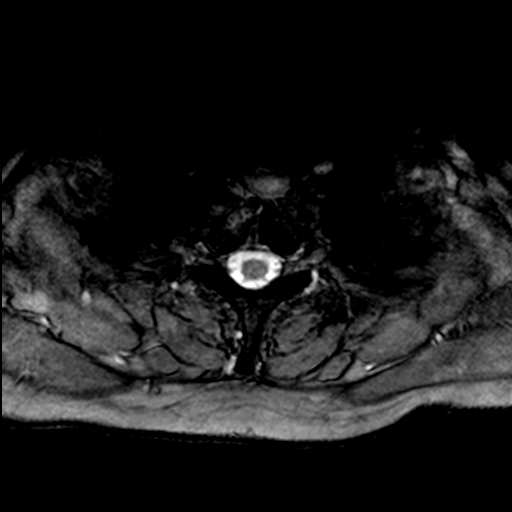

[Series 7: T2 · axial · 3.0mm · 0.70mm/px · z∈[-89,+3]mm · 8 of 26 slices shown (2 of 2)]
[im 1/26]
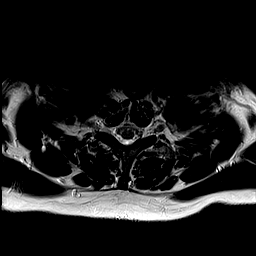
[im 4/26]
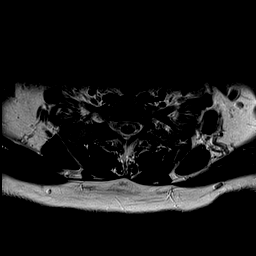
[im 8/26]
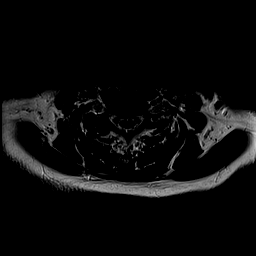
[im 12/26]
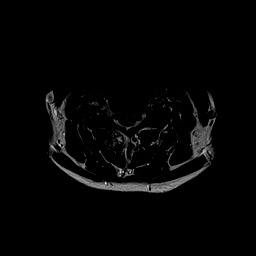
[im 14/26]
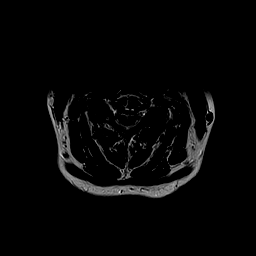
[im 18/26]
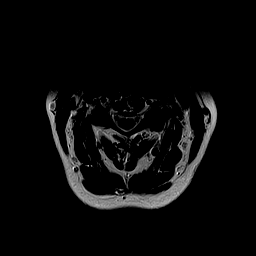
[im 22/26]
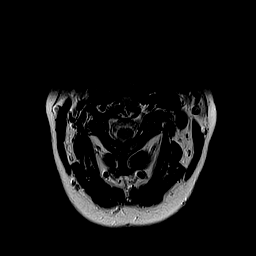
[im 26/26]
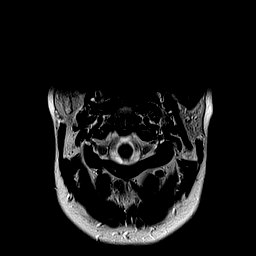

[29 of 48 positions shown; findings below may reference images not displayed]

FINDINGS: Alignment: Physiologic.

Vertebrae: No fracture, evidence of discitis, or bone lesion.

Cord: Normal signal and morphology.

Posterior Fossa, vertebral arteries, paraspinal tissues: Posterior
fossa demonstrates no focal abnormality. Vertebral artery flow voids
are maintained. Paraspinal soft tissues are unremarkable.

Disc levels:

Disc spaces: Disc spaces are maintained.

C2-3: No significant disc bulge. Mild bilateral facet arthropathy.
No neural foraminal stenosis. No central canal stenosis.

C3-4: No significant disc bulge. Moderate bilateral facet
arthropathy. Mild right foraminal stenosis. No left foraminal
stenosis. No central canal stenosis.

C4-5: Minimal broad-based disc bulge. Severe left facet arthropathy.
Bilateral uncovertebral degenerative changes. Mild left and moderate
right foraminal stenosis. No central canal stenosis.

C5-6: Mild broad-based disc bulge. Moderate right and mild left
facet arthropathy. Mild left foraminal stenosis. No right foraminal
stenosis. No central canal stenosis.

C6-7: Mild broad-based disc bulge. No neural foraminal stenosis. No
central canal stenosis.

C7-T1: No significant disc bulge. No neural foraminal stenosis. No
central canal stenosis.
IMPRESSION: 1. Mild cervical spine spondylosis as described above.
2.  No acute osseous injury of the cervical spine.

## 2022-04-16 ENCOUNTER — Other Ambulatory Visit: Payer: Self-pay | Admitting: Family Medicine

## 2022-04-16 DIAGNOSIS — Z1231 Encounter for screening mammogram for malignant neoplasm of breast: Secondary | ICD-10-CM

## 2022-04-24 ENCOUNTER — Ambulatory Visit
Admission: RE | Admit: 2022-04-24 | Discharge: 2022-04-24 | Disposition: A | Discharge status: Home / Self Care | Payer: BC Managed Care – PPO | Source: Ambulatory Visit | Attending: Family Medicine | Admitting: Family Medicine

## 2022-04-24 DIAGNOSIS — Z1231 Encounter for screening mammogram for malignant neoplasm of breast: Secondary | ICD-10-CM | POA: Insufficient documentation

## 2022-07-03 ENCOUNTER — Other Ambulatory Visit: Payer: Self-pay | Admitting: Family Medicine

## 2022-07-03 DIAGNOSIS — M1811 Unilateral primary osteoarthritis of first carpometacarpal joint, right hand: Secondary | ICD-10-CM

## 2022-07-03 DIAGNOSIS — G8929 Other chronic pain: Secondary | ICD-10-CM

## 2022-07-03 DIAGNOSIS — M4802 Spinal stenosis, cervical region: Secondary | ICD-10-CM

## 2022-07-03 NOTE — Telephone Encounter (Signed)
Requested medication (s) are due for refill today: yes  Requested medication (s) are on the active medication list: yes  Last refill:  06/15/21 #180 3 RF  Future visit scheduled: no  Notes to clinic:  called pt and LM on VM to call back for an appt.    Requested Prescriptions  Pending Prescriptions Disp Refills   gabapentin (NEURONTIN) 300 MG capsule [Pharmacy Med Name: GABAPENTIN 300MG CAPSULES] 180 capsule 3    Sig: TAKE 1 CAPSULE(300 MG) BY MOUTH TWICE DAILY     Neurology: Anticonvulsants - gabapentin Failed - 07/03/2022  3:31 AM      Failed - Cr in normal range and within 360 days    Creat  Date Value Ref Range Status  12/18/2020 0.81 0.50 - 1.03 mg/dL Final         Failed - Completed PHQ-2 or PHQ-9 in the last 360 days      Failed - Valid encounter within last 12 months    Recent Outpatient Visits           1 year ago Chronic pain of right thumb   [place], DO   1 year ago Annual physical exam   [facility] Olin Hauser, DO   1 year ago Osteoarthritis of spine with radiculopathy, cervical region   [place], DO   2 years ago Osteoarthritis of spine with radiculopathy, cervical region   [place], DO   3 years ago Annual physical exam   [facility] Edd Fabian, MD

## 2022-07-08 ENCOUNTER — Encounter: Payer: Self-pay | Admitting: Family Medicine

## 2022-07-08 ENCOUNTER — Ambulatory Visit: Payer: BC Managed Care – PPO | Admitting: Family Medicine

## 2022-07-08 VITALS — BP 120/78 | HR 77 | Ht 67.0 in | Wt 188.6 lb

## 2022-07-08 DIAGNOSIS — J0110 Acute frontal sinusitis, unspecified: Secondary | ICD-10-CM | POA: Diagnosis not present

## 2022-07-08 DIAGNOSIS — M4802 Spinal stenosis, cervical region: Secondary | ICD-10-CM

## 2022-07-08 DIAGNOSIS — M79644 Pain in right finger(s): Secondary | ICD-10-CM | POA: Diagnosis not present

## 2022-07-08 DIAGNOSIS — M1811 Unilateral primary osteoarthritis of first carpometacarpal joint, right hand: Secondary | ICD-10-CM

## 2022-07-08 DIAGNOSIS — J9801 Acute bronchospasm: Secondary | ICD-10-CM | POA: Diagnosis not present

## 2022-07-08 DIAGNOSIS — G8929 Other chronic pain: Secondary | ICD-10-CM

## 2022-07-08 MED ORDER — PREDNISONE 50 MG PO TABS: 50.0000 mg | ORAL_TABLET | Freq: Every day | ORAL | 0 refills | Status: DC

## 2022-07-08 MED ORDER — GABAPENTIN 300 MG PO CAPS: 300.0000 mg | ORAL_CAPSULE | Freq: Two times a day (BID) | ORAL | 3 refills | Status: DC

## 2022-07-08 MED ORDER — IPRATROPIUM BROMIDE 0.06 % NA SOLN: 2.0000 | Freq: Four times a day (QID) | NASAL | 0 refills | Status: DC

## 2022-07-08 MED ORDER — AZITHROMYCIN 250 MG PO TABS: ORAL_TABLET | ORAL | 0 refills | Status: DC

## 2022-07-08 NOTE — Patient Instructions (Addendum)
Thank you for coming to the office today.  1. It sounds like you had an Upper Respiratory Virus that has settled into a Bronchitis, lower respiratory tract infection. I don't have concerns for pneumonia today, and think that this should gradually improve. Once you are feeling better, the cough may take a few weeks to fully resolve.  Start Azithromycin Z pak (antibiotic) 2 tabs day 1, then 1 tab x 4 days, complete entire course even if improved  - Start Prednisone '50mg'$  daily for next 5 days - this will open up lungs allow you to breath better and treat that wheezing or bronchospasm  Start Atrovent nasal spray decongestant 2 sprays in each nostril up to 4 times daily for 7 days  - Use nasal saline (Simply Saline or Ocean Spray) to flush nasal congestion multiple times a day, may help cough - Drink plenty of fluids to improve congestion  If your symptoms seem to worsen instead of improve over next several days, including significant fever / chills, worsening shortness of breath, worsening wheezing, or nausea / vomiting and can't take medicines - return sooner or go to hospital Emergency Department for more immediate treatment.   Please schedule a Follow-up Appointment to: Return if symptoms worsen or fail to improve.  If you have any other questions or concerns, please feel free to call the office or send a message through [place]. You may also schedule an earlier appointment if necessary.  Additionally, you may be receiving a survey about your experience at our office within a few days to 1 week by e-mail or mail. We value your feedback.  Nobie Putnam, DO [place]

## 2022-07-08 NOTE — Progress Notes (Signed)
Subjective:    Patient ID: [name], female    DOB: 14-Dec-1965, 57 y.o.   MRN: [id number]  [name] is a 57 y.o. female presenting on 07/08/2022 for Neck Pain and Cough   HPI  Sinusitis Cough w Bronchospasm Reports sick contacts symptoms URI viral syndrome recently husband and other family, then she did not get sick until more recently. Now has had sinusitis symptoms 2 weeks, and improved but now persistent symptoms again. Admits wheezing productive cough  worse at night Denies any active fever chills body aches nausea vomiting      06/15/2021    2:15 PM 12/25/2020    3:53 PM 05/23/2020   10:14 AM  Depression screen PHQ 2/9  Decreased Interest 1 0 0  Down, Depressed, Hopeless 0 0 0  PHQ - 2 Score 1 0 0  Altered sleeping 3 3   Tired, decreased energy 3 1   Change in appetite 3 3   Feeling bad or failure about yourself  0 0   Trouble concentrating 3 0   Moving slowly or fidgety/restless 0 0   Suicidal thoughts 0 0   PHQ-9 Score 13 7   Difficult doing work/chores Not difficult at all Not difficult at all     Social History   Tobacco Use   Smoking status: Never   Smokeless tobacco: Never  Vaping Use   Vaping Use: Never used  Substance Use Topics   Alcohol use: No    Alcohol/week: 0.0 standard drinks of alcohol   Drug use: No    Review of Systems Per HPI unless specifically indicated above     Objective:    BP 120/78   Pulse 77   Ht '5\' 7"'$  (1.702 m)   Wt 188 lb 9.6 oz (85.5 kg)   LMP 12/12/2014   SpO2 98%   BMI 29.54 kg/m   Wt Readings from Last 3 Encounters:  07/08/22 188 lb 9.6 oz (85.5 kg)  06/15/21 185 lb 9.6 oz (84.2 kg)  12/25/20 181 lb (82.1 kg)    Physical Exam Vitals and nursing note reviewed.  Constitutional:      General: She is not in acute distress.    Appearance: Normal appearance. She is well-developed. She is not diaphoretic.     Comments: Well-appearing, comfortable, cooperative  HENT:     Head: Normocephalic and  atraumatic.  Eyes:     General:        Right eye: No discharge.        Left eye: No discharge.     Conjunctiva/sclera: Conjunctivae normal.  Neck:     Thyroid: No thyromegaly.  Cardiovascular:     Rate and Rhythm: Normal rate and regular rhythm.     Heart sounds: Normal heart sounds. No murmur heard. Pulmonary:     Effort: Pulmonary effort is normal. No respiratory distress.     Breath sounds: Wheezing present. No rales.  Musculoskeletal:        General: Normal range of motion.     Cervical back: Normal range of motion and neck supple.  Lymphadenopathy:     Cervical: No cervical adenopathy.  Skin:    General: Skin is warm and dry.     Findings: No erythema or rash.  Neurological:     Mental Status: She is alert and oriented to person, place, and time.  Psychiatric:        Mood and Affect: Mood normal.        Behavior:  Behavior normal.        Thought Content: Thought content normal.     Comments: Well groomed, good eye contact, normal speech and thoughts    Results for orders placed or performed in visit on 06/15/21  Sed Rate (ESR)  Result Value Ref Range   Sed Rate 9 0 - 30 mm/h  C-reactive protein  Result Value Ref Range   CRP 2.3 123XX123 mg/L  Cyclic citrul peptide antibody, IgG  Result Value Ref Range   Cyclic Citrullin Peptide Ab <16 UNITS  Rheumatoid Factor  Result Value Ref Range   Rhuematoid fact SerPl-aCnc <14 <14 IU/mL  ANA  Result Value Ref Range   Anti Nuclear Antibody (ANA) NEGATIVE NEGATIVE      Assessment & Plan:   Problem List Items Addressed This Visit     Cervical spinal stenosis   Relevant Medications   gabapentin (NEURONTIN) 300 MG capsule   Other Visit Diagnoses     Acute non-recurrent frontal sinusitis    -  Primary   Relevant Medications   azithromycin (ZITHROMAX Z-PAK) 250 MG tablet   predniSONE (DELTASONE) 50 MG tablet   ipratropium (ATROVENT) 0.06 % nasal spray   Cough due to bronchospasm       Relevant Medications   azithromycin  (ZITHROMAX Z-PAK) 250 MG tablet   predniSONE (DELTASONE) 50 MG tablet   Chronic pain of right thumb       Relevant Medications   predniSONE (DELTASONE) 50 MG tablet   gabapentin (NEURONTIN) 300 MG capsule   Arthritis of carpometacarpal (CMC) joint of right thumb       Relevant Medications   predniSONE (DELTASONE) 50 MG tablet   gabapentin (NEURONTIN) 300 MG capsule       Sinusitis Post viral syndrome Bronchospasm secondary Duration now >5 days and afebrile defer testing today  Start Azithromycin Z pak (antibiotic) 2 tabs day 1, then 1 tab x 4 days, complete entire course even if improved  - Start Prednisone '50mg'$  daily for next 5 days - this will open up lungs allow you to breath better and treat that wheezing or bronchospasm  Start Atrovent nasal spray decongestant 2 sprays in each nostril up to 4 times daily for 7 days  - Use nasal saline (Simply Saline or Ocean Spray) to flush nasal congestion multiple times a day, may help cough - Drink plenty of fluids to improve congestion  #refill gabapentin for chronic neck pain DDD   Meds ordered this encounter  Medications   azithromycin (ZITHROMAX Z-PAK) 250 MG tablet    Sig: Take 2 tabs ('500mg'$  total) on Day 1. Take 1 tab ('250mg'$ ) daily for next 4 days.    Dispense:  6 tablet    Refill:  0   predniSONE (DELTASONE) 50 MG tablet    Sig: Take 1 tablet (50 mg total) by mouth daily with breakfast.    Dispense:  5 tablet    Refill:  0   ipratropium (ATROVENT) 0.06 % nasal spray    Sig: Place 2 sprays into both nostrils 4 (four) times daily. For up to 5-7 days then stop.    Dispense:  15 mL    Refill:  0   gabapentin (NEURONTIN) 300 MG capsule    Sig: Take 1 capsule (300 mg total) by mouth 2 (two) times daily.    Dispense:  180 capsule    Refill:  3      Follow up plan: Return if symptoms worsen or fail to improve.  Nobie Putnam, DO [facility] 07/08/2022, 3:04 PM

## 2022-07-11 ENCOUNTER — Other Ambulatory Visit: Payer: Self-pay | Admitting: Family Medicine

## 2022-07-11 DIAGNOSIS — J0110 Acute frontal sinusitis, unspecified: Secondary | ICD-10-CM

## 2022-07-11 DIAGNOSIS — J9801 Acute bronchospasm: Secondary | ICD-10-CM

## 2022-07-11 MED ORDER — ALBUTEROL SULFATE HFA 108 (90 BASE) MCG/ACT IN AERS: 2.0000 | INHALATION_SPRAY | RESPIRATORY_TRACT | 0 refills | Status: DC | PRN

## 2022-07-11 MED ORDER — PSEUDOEPH-BROMPHEN-DM 30-2-10 MG/5ML PO SYRP: 5.0000 mL | ORAL_SOLUTION | Freq: Four times a day (QID) | ORAL | 0 refills | Status: DC | PRN

## 2022-07-30 ENCOUNTER — Other Ambulatory Visit: Payer: Self-pay | Admitting: Family Medicine

## 2022-07-30 DIAGNOSIS — J0110 Acute frontal sinusitis, unspecified: Secondary | ICD-10-CM

## 2022-07-31 NOTE — Telephone Encounter (Signed)
Requested medication (s) are due for refill today: yes  Requested medication (s) are on the active medication list: yes  Last refill:  07/08/22  Future visit scheduled: no  Notes to clinic:  [place]. DX Code Needed.      Requested Prescriptions  Pending Prescriptions Disp Refills   ipratropium (ATROVENT) 0.06 % nasal spray [Pharmacy Med Name: IPRATROPIUM 0.06% SPRAY]  1    Sig: Place 2 sprays into both nostrils 4 (four) times daily. For up to 5-7 days then stop.     Off-Protocol Failed - 07/30/2022  2:33 PM      Failed - Medication not assigned to a protocol, review manually.      Passed - Valid encounter within last 12 months    Recent Outpatient Visits           3 weeks ago Acute non-recurrent frontal sinusitis   [place], DO   1 year ago Chronic pain of right thumb   [facility] Olin Hauser, DO   1 year ago Annual physical exam   [facility] Olin Hauser, DO   1 year ago Osteoarthritis of spine with radiculopathy, cervical region   [place], DO   2 years ago Osteoarthritis of spine with radiculopathy, cervical region   [place], DO             Off-Protocol Failed - 07/30/2022  2:33 PM      Failed - Medication not assigned to a protocol, review manually.      Passed - Valid encounter within last 12 months    Recent Outpatient Visits           3 weeks ago Acute non-recurrent frontal sinusitis   [place], DO   1 year ago Chronic pain of right thumb   [facility] Olin Hauser, DO   1 year ago Annual physical exam   [facility] Olin Hauser, DO   1 year ago  Osteoarthritis of spine with radiculopathy, cervical region   [place], DO   2 years ago Osteoarthritis of spine with radiculopathy, cervical region   [place], Devonne Dought[place], [place]

## 2022-10-23 ENCOUNTER — Encounter: Payer: Self-pay | Admitting: Family Medicine

## 2022-10-24 ENCOUNTER — Telehealth: Payer: Self-pay

## 2022-10-29 NOTE — Telephone Encounter (Signed)
Open in error

## 2022-11-05 ENCOUNTER — Ambulatory Visit (INDEPENDENT_AMBULATORY_CARE_PROVIDER_SITE_OTHER): Payer: 59 | Admitting: Family Medicine

## 2022-11-05 ENCOUNTER — Encounter: Payer: Self-pay | Admitting: Family Medicine

## 2022-11-05 VITALS — BP 116/88 | HR 68 | Ht 67.0 in | Wt 188.0 lb

## 2022-11-05 DIAGNOSIS — N951 Menopausal and female climacteric states: Secondary | ICD-10-CM

## 2022-11-05 MED ORDER — VENLAFAXINE HCL ER 37.5 MG PO CP24: 37.5000 mg | ORAL_CAPSULE | Freq: Every day | ORAL | 2 refills | Status: DC

## 2022-11-05 NOTE — Progress Notes (Unsigned)
Subjective:    Patient ID: [name], female    DOB: 10/10/1965, 57 y.o.   MRN: [id number]  [name] is a 57 y.o. female presenting on 11/05/2022 for Menopause (Pt concerns of possible menopausal symptoms, She had a partial hysterectomy x 7 yrs ago. Fatigue, joint pain, depression, irritability  and breast tenderness  etc. )   HPI  Menopause Symptoms Vasomotor hot flashes Limited success on various Supplements Menoslim, Ambryn > Estroven On Mag, Zinc, Chromium, Turmeric On Thyroid supplement On Colace  Past history of Sertraline/Zoloft trial in the past only 30 days, caused migraine headaches  Hot Flashes Gabapentin 300mg  for her neck, taking TWICE A DAY as needed. Still has medicine. But did not help hot flash  S/p partial hysterectomy, without cervix uterus fallopian tubes, has ovaries. No longer seeing GYN  Past Surgical History:  Procedure Laterality Date   BREAST BIOPSY Left 1995   neg   BREAST SURGERY  1994   left breast bx   COLONOSCOPY     COLONOSCOPY W/ BIOPSIES  2013   divert. cleared for 5 yrs- [facility]   COLPOSCOPY  2002   ABNORMAL PAP   CYSTOSCOPY  06/06/2015   Procedure: CYSTOSCOPY;  Surgeon: Tereso Newcomer, MD;  Location: [facility];  Service: Gynecology;;   DILATION AND CURETTAGE OF UTERUS     TAB   LAPAROSCOPIC VAGINAL HYSTERECTOMY WITH SALPINGO OOPHORECTOMY N/A 06/06/2015   Procedure: LAPAROSCOPIC ASSISTED VAGINAL HYSTERECTOMY ;  Surgeon: Tereso Newcomer, MD;  Location: [facility];  Service: Gynecology;  Laterality: N/A;  722.9grams    LEEP  2002   POLYPECTOMY     TUBAL LIGATION     VAGINAL HYSTERECTOMY  05/2015       11/05/2022    1:43 PM 06/15/2021    2:15 PM 12/25/2020    3:53 PM  Depression screen PHQ 2/9  Decreased Interest 3 1 0  Down, Depressed, Hopeless 3 0 0  PHQ - 2 Score 6 1 0  Altered sleeping 3 3 3   Tired, decreased energy 3 3 1   Change in appetite 3 3 3   Feeling bad or failure about yourself  3 0 0  Trouble concentrating 3  3 0  Moving slowly or fidgety/restless 0 0 0  Suicidal thoughts 0 0 0  PHQ-9 Score 21 13 7   Difficult doing work/chores Extremely dIfficult Not difficult at all Not difficult at all      11/05/2022    1:43 PM 06/15/2021    2:15 PM 12/25/2020    3:54 PM  GAD 7 : Generalized Anxiety Score  Nervous, Anxious, on Edge 0 0 0  Control/stop worrying 3 0 0  Worry too much - different things 0 0 0  Trouble relaxing 0 1 0  Restless 2 0 0  Easily annoyed or irritable 0 1 0  Afraid - awful might happen 0 0 0  Total GAD 7 Score 5 2 0  Anxiety Difficulty Not difficult at all Not difficult at all Not difficult at all      Social History   Tobacco Use   Smoking status: Never   Smokeless tobacco: Never  Vaping Use   Vaping Use: Never used  Substance Use Topics   Alcohol use: No    Alcohol/week: 0.0 standard drinks of alcohol   Drug use: No    Review of Systems Per HPI unless specifically indicated above     Objective:    BP 116/88 (BP Location: Right  Arm, Patient Position: Sitting, Cuff Size: Normal)   Pulse 68   Ht 5\' 7"  (1.702 m)   Wt 188 lb (85.3 kg)   LMP 12/12/2014   SpO2 97%   BMI 29.44 kg/m   Wt Readings from Last 3 Encounters:  11/05/22 188 lb (85.3 kg)  07/08/22 188 lb 9.6 oz (85.5 kg)  06/15/21 185 lb 9.6 oz (84.2 kg)    Physical Exam Vitals and nursing note reviewed.  Constitutional:      General: She is not in acute distress.    Appearance: Normal appearance. She is well-developed. She is not diaphoretic.     Comments: Well-appearing, comfortable, cooperative  HENT:     Head: Normocephalic and atraumatic.  Eyes:     General:        Right eye: No discharge.        Left eye: No discharge.     Conjunctiva/sclera: Conjunctivae normal.  Cardiovascular:     Rate and Rhythm: Normal rate.  Pulmonary:     Effort: Pulmonary effort is normal.  Skin:    General: Skin is warm and dry.     Findings: No erythema or rash.  Neurological:     Mental Status: She is  alert and oriented to person, place, and time.  Psychiatric:        Mood and Affect: Mood normal.        Behavior: Behavior normal.        Thought Content: Thought content normal.     Comments: Well groomed, good eye contact, normal speech and thoughts      Results for orders placed or performed in visit on 06/15/21  Sed Rate (ESR)  Result Value Ref Range   Sed Rate 9 0 - 30 mm/h  C-reactive protein  Result Value Ref Range   CRP 2.3 <8.0 mg/L  Cyclic citrul peptide antibody, IgG  Result Value Ref Range   Cyclic Citrullin Peptide Ab <32 UNITS  Rheumatoid Factor  Result Value Ref Range   Rheumatoid fact SerPl-aCnc <14 <14 IU/mL  ANA  Result Value Ref Range   Anti Nuclear Antibody (ANA) NEGATIVE NEGATIVE      Assessment & Plan:   Problem List Items Addressed This Visit   None Visit Diagnoses     Postmenopausal syndrome    -  Primary   Relevant Medications   venlafaxine XR (EFFEXOR XR) 37.5 MG 24 hr capsule   Other Relevant Orders   Ambulatory referral to Obstetrics / Gynecology   Menopausal vasomotor syndrome       Relevant Medications   venlafaxine XR (EFFEXOR XR) 37.5 MG 24 hr capsule   Other Relevant Orders   Ambulatory referral to Obstetrics / Gynecology       Postmenopausal Syndroe + Vasomotor Symptoms  Referral to Dr Eartha Inch OBGYN for further overall management HRT options and med adjust  Rx Venlafaxine 37.5mg  trial for menopausal mood related symptoms and hot flashes May use current Gabapentin and add for hot flashes  Orders Placed This Encounter  Procedures   Ambulatory referral to Obstetrics / Gynecology    Referral Priority:   Routine    Referral Type:   Consultation    Referral Reason:   Specialty Services Required    Requested Specialty:   Obstetrics and Gynecology    Number of Visits Requested:   1      Meds ordered this encounter  Medications   venlafaxine XR (EFFEXOR XR) 37.5 MG 24 hr capsule  Sig: Take 1 capsule (37.5 mg total)  by mouth daily with breakfast.    Dispense:  30 capsule    Refill:  2      Follow up plan: Return if symptoms worsen or fail to improve.   Saralyn Pilar, DO [facility] 11/05/2022, 1:49 PM

## 2022-11-05 NOTE — Patient Instructions (Addendum)
Thank you for coming to the office today.  New rx Venlafaxine (Effexor) 37.5mg  capsule once daily, for mood, hot flashes etc  Consider adding the existing Gabapentin in evening.  We can adjust doses accordingly if you need more.  -------------------------  Referral sent to Dr Marice Potter - goal to discuss hormonal and other options rx meds for menopausal symptoms.  Check back with [place] for update within 2 weeks if not heard anything.  [facility] at [facility] [address] [place],  [place]  [address] Main: [phone] Fax: [phone]   ------------------------------------- PROCEDURE DATE: 06/06/2015    PREOPERATIVE DIAGNOSES: Abnormal uterine bleeding, fibroids  POSTOPERATIVE DIAGNOSES: The same PROCEDURE: Laparoscopic assisted vaginal hysterectomy, bilateral salpingectomy, cystoscopy SURGEON:  Dr. Jaynie Collins ASSISTANT: Dr. Willodean Rosenthal   FINDINGS:  Large fibroid uterus (weighed 722 g in OR) with large penduculated fibroid, normal adnexa bilaterally.  No evidence of endometriosis, adhesions or any other abdominal/pelvic abnormality.  Normal upper abdomen. Normal ureters and bladder on cystoscopy.  SPECIMENS: Uterus, cervix, bilateral fallopian tubes.   Please schedule a Follow-up Appointment to: Return if symptoms worsen or fail to improve.  If you have any other questions or concerns, please feel free to call the office or send a message through MyChart. You may also schedule an earlier appointment if necessary.  Additionally, you may be receiving a survey about your experience at our office within a few days to 1 week by e-mail or mail. We value your feedback.  Saralyn Pilar, DO [facility], [place]

## 2022-11-12 ENCOUNTER — Encounter: Payer: Self-pay | Admitting: Obstetrics and Gynecology

## 2022-11-12 ENCOUNTER — Ambulatory Visit (INDEPENDENT_AMBULATORY_CARE_PROVIDER_SITE_OTHER): Payer: 59 | Admitting: Obstetrics and Gynecology

## 2022-11-12 VITALS — BP 133/77 | HR 60 | Ht 67.0 in | Wt 188.7 lb

## 2022-11-12 DIAGNOSIS — N951 Menopausal and female climacteric states: Secondary | ICD-10-CM

## 2022-11-12 DIAGNOSIS — Z7689 Persons encountering health services in other specified circumstances: Secondary | ICD-10-CM

## 2022-11-12 MED ORDER — ESTRADIOL 1 MG PO TABS: 1.0000 mg | ORAL_TABLET | Freq: Every day | ORAL | 0 refills | Status: DC

## 2022-11-12 NOTE — Progress Notes (Signed)
HPI:      [name] is a 57 y.o. [id number] who LMP was Patient's last menstrual period was 12/12/2014.  Subjective:   She presents today because she has had worsening menopausal symptoms over the last year.  She thinks she started having symptoms about 3 years ago but they have become significantly worse.  She reports hot flashes night sweats difficulty sleeping mood changes brain fog etc.  She is currently taking a myriad of supplements trying to control her symptoms.  She would like to stop taking these. Of significant note she has had a hysterectomy.     Hx: The following portions of the patient's history were reviewed and updated as appropriate:             She  has a past medical history of Abnormal Pap smear (2002), Allergy, Arthritis, Constipation, GERD (gastroesophageal reflux disease), Hemorrhoid, History of blood transfusion, Hypothyroidism, and Shortness of breath dyspnea. She does not have any pertinent problems on file. She  has a past surgical history that includes Tubal ligation; Breast surgery (1994); LEEP (2002); Dilation and curettage of uterus; Colposcopy (2002); Colonoscopy w/ biopsies (2013); Laparoscopic vaginal hysterectomy with salpingo oophorectomy (N/A, 06/06/2015); Cystoscopy (06/06/2015); Vaginal hysterectomy (05/2015); Breast biopsy (Left, 1995); Colonoscopy; and Polypectomy. Her family history includes Cancer in her maternal grandmother; Diabetes in her father, paternal aunt, and paternal uncle; Heart disease in her paternal aunt and paternal uncle; Hypertension in her father; Stroke in her maternal grandfather. She  reports that she has never smoked. She has never used smokeless tobacco. She reports that she does not drink alcohol and does not use drugs. She has a current medication list which includes the following prescription(s): acetaminophen, b complex-c, biotin, calcium, diphenhydramine, docusate sodium, estradiol, gabapentin, move free joint health advance,  melatonin, meloxicam, metronidazole (topical), multivitamin with minerals, NON FORMULARY, NON FORMULARY, prednisone, align, rhubarb, thyroid, turmeric, venlafaxine xr, and [DISCONTINUED] montelukast. She is allergic to doxycycline, ciprofloxacin, and sulfa antibiotics.       Review of Systems:  Review of Systems  Constitutional: Denied constitutional symptoms, night sweats, recent illness, fatigue, fever, insomnia and weight loss.  Eyes: Denied eye symptoms, eye pain, photophobia, vision change and visual disturbance.  Ears/Nose/Throat/Neck: Denied ear, nose, throat or neck symptoms, hearing loss, nasal discharge, sinus congestion and sore throat.  Cardiovascular: Denied cardiovascular symptoms, arrhythmia, chest pain/pressure, edema, exercise intolerance, orthopnea and palpitations.  Respiratory: Denied pulmonary symptoms, asthma, pleuritic pain, productive sputum, cough, dyspnea and wheezing.  Gastrointestinal: Denied, gastro-esophageal reflux, melena, nausea and vomiting.  Genitourinary: Denied genitourinary symptoms including symptomatic vaginal discharge, pelvic relaxation issues, and urinary complaints.  Musculoskeletal: Denied musculoskeletal symptoms, stiffness, swelling, muscle weakness and myalgia.  Dermatologic: Denied dermatology symptoms, rash and scar.  Neurologic: Denied neurology symptoms, dizziness, headache, neck pain and syncope.  Psychiatric: Denied psychiatric symptoms, anxiety and depression.  Endocrine: See HPI for additional information.   Meds:   Current Outpatient Medications on File Prior to Visit  Medication Sig Dispense Refill   Acetaminophen (TYLENOL ARTHRITIS PAIN PO) Take 2 tablets by mouth daily as needed (pain).     B COMPLEX-C PO Take by mouth.     Biotin 45409 MCG TABS Take by mouth.     Ca & Phos-Vit D-Mag (CALCIUM) [id number] TABS Take by mouth.     diphenhydrAMINE (BENADRYL) 25 MG tablet Take 25 mg by mouth every 6 (six) hours as needed.      docusate sodium (COLACE) 250 MG capsule Take 250 mg by mouth  daily.     gabapentin (NEURONTIN) 300 MG capsule Take 1 capsule (300 mg total) by mouth 2 (two) times daily. (Patient taking differently: Take 300 mg by mouth daily.) 180 capsule 3   Glucos-Chond-Hyal Ac-Ca Fructo (MOVE FREE JOINT HEALTH ADVANCE) TABS Take by mouth.     Melatonin 10 MG TABS Take by mouth.     meloxicam (MOBIC) 15 MG tablet Take 1 tablet (15 mg total) by mouth daily as needed for pain. 30 tablet 2   METRONIDAZOLE, TOPICAL, 0.75 % LOTN Apply 1 application topically 2 (two) times daily. 59 mL 3   Multiple Vitamins-Minerals (MULTIVITAMIN WITH MINERALS) tablet Take 1 tablet by mouth daily.     NON FORMULARY      NON FORMULARY Balanced Babe (Mood Swing, Breast Health, and Bloating)     predniSONE (DELTASONE) 50 MG tablet Take 1 tablet (50 mg total) by mouth daily with breakfast. 5 tablet 0   Probiotic Product (ALIGN) 4 MG CAPS Take 1 capsule by mouth daily. 30 capsule 6   Rhubarb (ESTROVEN COMPLETE PO) Take by mouth.     thyroid (ARMOUR) 60 MG tablet Take 1 tablet by mouth daily. [name]     Turmeric 500 MG CAPS Take by mouth.     venlafaxine XR (EFFEXOR XR) 37.5 MG 24 hr capsule Take 1 capsule (37.5 mg total) by mouth daily with breakfast. 30 capsule 2   [DISCONTINUED] montelukast (SINGULAIR) 10 MG tablet Take 1 tablet (10 mg total) by mouth at bedtime. 30 tablet 3   No current facility-administered medications on file prior to visit.      Objective:     Vitals:   11/12/22 1020  BP: 133/77  Pulse: 60   Filed Weights   11/12/22 1020  Weight: 188 lb 11.2 oz (85.6 kg)                        Assessment:    [id number] Patient Active Problem List   Diagnosis Date Noted   Osteoarthritis of spine with radiculopathy, cervical region 05/23/2020   Chronic neck pain 05/23/2020   Cervical spinal stenosis 05/23/2020   Chronic heel pain, left 05/23/2020   Primary osteoarthritis of first carpometacarpal  joint of right hand 05/23/2020   Chronic thumb pain, right 05/23/2020   Cervical disc disorder with radiculopathy 10/23/2017   Insomnia 08/24/2015   Stress incontinence in female 08/24/2015   FH: diabetes mellitus 08/24/2015   S/P laparoscopic assisted vaginal hysterectomy (LAVH) 06/06/2015   Rosacea 02/24/2015   Hypothyroidism 01/26/2015   Systolic murmur 01/25/2015   IBS (irritable bowel syndrome) 06/13/2011   Hemorrhoid 06/04/2011     1. Establishing care with new doctor, encounter for   2. Menopausal symptoms     I think many of her symptoms are hypoestrogenic related.   Plan:            1.  HRT I have discussed HRT with the patient in detail.  The risk/benefits of it were reviewed.  She understands that during menopause Estrogen decreases dramatically and that this results in an increased risk of cardiovascular disease as well as osteoporosis.  We have also discussed the fact that hot flashes often result from a decrease in Estrogen, and that by replacing Estrogen, they can often be alleviated.  We have discussed skin, vaginal and urinary tract changes that may also take place from this drop in Estrogen.  Emotional changes have also been linked to Estrogen and we have  briefly discussed this.  The benefits of HRT including decrease in hot flashes, vaginal dryness, and osteoporosis were discussed.  The emotional benefit and a possible change in her cardiovascular risk profile was also reviewed.  The risks associated with Hormone Replacement Therapy were also reviewed.  The patient was informed that this is an elective medication and that she may choose not to take Hormone Replacement Therapy.  Literature on HRT was made available, and I believe that after answering all of the patient's questions.  Special emphasis on the WHI study, as well as several studies since that pertaining to the risks and benefits of estrogen replacement therapy were compared.  The possible limitations of these  studies were discussed including the age stratification of the WHI study.  The possible increased role of Progesterone in these studies was discussed in detail.  Different types of hormone formulation and methods of taking hormone replacement therapy were discussed. Literature on HRT was made available, and I believe that after answering all of the patient's questions she has an adequate and informed understanding of the risks and benefits of HRT.  She has chosen pills over patch.   Orders No orders of the defined types were placed in this encounter.    Meds ordered this encounter  Medications   estradiol (ESTRACE) 1 MG tablet    Sig: Take 1 tablet (1 mg total) by mouth daily.    Dispense:  90 tablet    Refill:  0      F/U  Return in about 3 months (around 02/12/2023). I spent 32 minutes involved in the care of this patient preparing to see the patient by obtaining and reviewing her medical history (including labs, imaging tests and prior procedures), documenting clinical information in the electronic health record (EHR), counseling and coordinating care plans, writing and sending prescriptions, ordering tests or procedures and in direct communicating with the patient and medical staff discussing pertinent items from her history and physical exam.  Elonda Husky, M.D. 11/12/2022 10:50 AM

## 2022-11-12 NOTE — Progress Notes (Signed)
Patient presents today due to ongoing menopausal symptoms. History of a hysterectomy in 2017. She states her symptoms began approximately 3 years ago and have gotten worse. She would like to discuss HRT today. No additional concerns.

## 2022-11-13 ENCOUNTER — Telehealth: Payer: Self-pay

## 2022-11-13 DIAGNOSIS — N951 Menopausal and female climacteric states: Secondary | ICD-10-CM

## 2022-11-13 MED ORDER — ESTRADIOL 1 MG PO TABS: 1.0000 mg | ORAL_TABLET | Freq: Every day | ORAL | 0 refills | Status: DC

## 2022-11-13 NOTE — Telephone Encounter (Signed)
Patient called triage to ask about estradiol Rx Dr. Logan Bores was going to send yesterday to her local [facility]. Advised pt it was sent to [facility]. She needs Rx sent to local pharmacy. Rx sent and pt aware.

## 2022-11-27 ENCOUNTER — Other Ambulatory Visit: Payer: Self-pay | Admitting: Family Medicine

## 2022-11-27 DIAGNOSIS — N951 Menopausal and female climacteric states: Secondary | ICD-10-CM

## 2022-12-03 ENCOUNTER — Other Ambulatory Visit: Payer: Self-pay

## 2022-12-03 DIAGNOSIS — N951 Menopausal and female climacteric states: Secondary | ICD-10-CM

## 2022-12-03 MED ORDER — VENLAFAXINE HCL ER 37.5 MG PO CP24: 37.5000 mg | ORAL_CAPSULE | Freq: Every day | ORAL | 1 refills | Status: DC

## 2022-12-08 ENCOUNTER — Other Ambulatory Visit: Payer: Self-pay | Admitting: Family Medicine

## 2022-12-08 DIAGNOSIS — M1811 Unilateral primary osteoarthritis of first carpometacarpal joint, right hand: Secondary | ICD-10-CM

## 2022-12-08 DIAGNOSIS — G8929 Other chronic pain: Secondary | ICD-10-CM

## 2022-12-08 DIAGNOSIS — M4802 Spinal stenosis, cervical region: Secondary | ICD-10-CM

## 2022-12-10 NOTE — Telephone Encounter (Signed)
Requested Prescriptions  Refused Prescriptions Disp Refills   gabapentin (NEURONTIN) 300 MG capsule [Pharmacy Med Name: GABAPENTIN 300 MG CAPSULE] 180 capsule 3    Sig: TAKE 1 CAPSULE BY MOUTH TWICE A DAY     Neurology: Anticonvulsants - gabapentin Failed - 12/08/2022  8:32 AM      Failed - Cr in normal range and within 360 days    Creat  Date Value Ref Range Status  12/18/2020 0.81 0.50 - 1.03 mg/dL Final         Passed - Completed PHQ-2 or PHQ-9 in the last 360 days      Passed - Valid encounter within last 12 months    Recent Outpatient Visits           1 month ago Postmenopausal syndrome   [place] [facility] [place], Netta Neat, DO   5 months ago Acute non-recurrent frontal sinusitis   [place] [facility] Smitty Cords, DO   1 year ago Chronic pain of right thumb   [facility] Smitty Cords, DO   1 year ago Annual physical exam    [facility] Smitty Cords, DO   2 years ago Osteoarthritis of spine with radiculopathy, cervical region   [facility] [place], Netta Neat, [place]

## 2022-12-19 ENCOUNTER — Other Ambulatory Visit: Payer: Self-pay

## 2022-12-19 DIAGNOSIS — M1811 Unilateral primary osteoarthritis of first carpometacarpal joint, right hand: Secondary | ICD-10-CM

## 2022-12-19 DIAGNOSIS — M4802 Spinal stenosis, cervical region: Secondary | ICD-10-CM

## 2022-12-19 DIAGNOSIS — G8929 Other chronic pain: Secondary | ICD-10-CM

## 2022-12-20 MED ORDER — GABAPENTIN 300 MG PO CAPS: 300.0000 mg | ORAL_CAPSULE | Freq: Two times a day (BID) | ORAL | 0 refills | Status: DC

## 2023-01-01 ENCOUNTER — Other Ambulatory Visit: Payer: Self-pay | Admitting: Obstetrics and Gynecology

## 2023-01-01 DIAGNOSIS — N951 Menopausal and female climacteric states: Secondary | ICD-10-CM

## 2023-02-03 ENCOUNTER — Other Ambulatory Visit: Payer: Self-pay | Admitting: Obstetrics and Gynecology

## 2023-02-03 DIAGNOSIS — N951 Menopausal and female climacteric states: Secondary | ICD-10-CM

## 2023-03-01 ENCOUNTER — Other Ambulatory Visit: Payer: Self-pay | Admitting: Internal Medicine

## 2023-03-01 DIAGNOSIS — M1811 Unilateral primary osteoarthritis of first carpometacarpal joint, right hand: Secondary | ICD-10-CM

## 2023-03-01 DIAGNOSIS — G8929 Other chronic pain: Secondary | ICD-10-CM

## 2023-03-01 DIAGNOSIS — M4802 Spinal stenosis, cervical region: Secondary | ICD-10-CM

## 2023-03-03 NOTE — Telephone Encounter (Signed)
Requested Prescriptions  Pending Prescriptions Disp Refills   gabapentin (NEURONTIN) 300 MG capsule [Pharmacy Med Name: GABAPENTIN CAP 300MG  (NEUR)] 180 capsule 1    Sig: TAKE 1 CAPSULE BY MOUTH TWICE  DAILY     Neurology: Anticonvulsants - gabapentin Failed - 03/01/2023  5:01 AM      Failed - Cr in normal range and within 360 days    Creat  Date Value Ref Range Status  12/18/2020 0.81 0.50 - 1.03 mg/dL Final         Passed - Completed PHQ-2 or PHQ-9 in the last 360 days      Passed - Valid encounter within last 12 months    Recent Outpatient Visits           3 months ago Postmenopausal syndrome   [place] [facility] [place], Netta Neat, DO   7 months ago Acute non-recurrent frontal sinusitis   [place] [facility] [facility] Smitty Cords, DO   1 year ago Chronic pain of right thumb   [place] [facility] Smitty Cords, DO   2 years ago Annual physical exam   [place] [facility] Smitty Cords, DO   2 years ago Osteoarthritis of spine with radiculopathy, cervical region   [facility] [place], Netta Neat, [place]

## 2023-03-28 ENCOUNTER — Encounter: Payer: Self-pay | Admitting: Family Medicine

## 2023-03-28 DIAGNOSIS — N951 Menopausal and female climacteric states: Secondary | ICD-10-CM

## 2023-03-28 MED ORDER — VENLAFAXINE HCL ER 37.5 MG PO CP24: 37.5000 mg | ORAL_CAPSULE | Freq: Every day | ORAL | 1 refills | Status: DC

## 2023-04-02 ENCOUNTER — Other Ambulatory Visit: Payer: Self-pay | Admitting: Family Medicine

## 2023-04-02 DIAGNOSIS — Z1231 Encounter for screening mammogram for malignant neoplasm of breast: Secondary | ICD-10-CM

## 2023-04-03 ENCOUNTER — Ambulatory Visit (INDEPENDENT_AMBULATORY_CARE_PROVIDER_SITE_OTHER): Payer: 59 | Admitting: Obstetrics and Gynecology

## 2023-04-03 ENCOUNTER — Encounter: Payer: Self-pay | Admitting: Obstetrics and Gynecology

## 2023-04-03 VITALS — BP 111/76 | HR 66 | Ht 67.0 in | Wt 186.2 lb

## 2023-04-03 DIAGNOSIS — Z79890 Hormone replacement therapy (postmenopausal): Secondary | ICD-10-CM

## 2023-04-03 DIAGNOSIS — N951 Menopausal and female climacteric states: Secondary | ICD-10-CM

## 2023-04-03 MED ORDER — ESTRADIOL 1 MG PO TABS: 1.0000 mg | ORAL_TABLET | Freq: Every day | ORAL | 3 refills | Status: DC

## 2023-04-03 NOTE — Progress Notes (Signed)
HPI:      [name] is a 57 y.o. [id number] who LMP was Patient's last menstrual period was 12/12/2014.  Subjective:   She presents today to follow-up beginning ERT.  She has really had a noticeable change since beginning ERT.  She has dropped most of her supplements and feels much better.  She reports significant elevation of mood and decrease aches and pains as well as hot flashes.  She would like to continue ERT.    Hx: The following portions of the patient's history were reviewed and updated as appropriate:             She  has a past medical history of Abnormal Pap smear (2002), Allergy, Arthritis, Constipation, GERD (gastroesophageal reflux disease), Hemorrhoid, History of blood transfusion, Hypothyroidism, and Shortness of breath dyspnea. She does not have any pertinent problems on file. She  has a past surgical history that includes Tubal ligation; Breast surgery (1994); LEEP (2002); Dilation and curettage of uterus; Colposcopy (2002); Colonoscopy w/ biopsies (2013); Laparoscopic vaginal hysterectomy with salpingo oophorectomy (N/A, 06/06/2015); Cystoscopy (06/06/2015); Vaginal hysterectomy (05/2015); Breast biopsy (Left, 1995); Colonoscopy; and Polypectomy. Her family history includes Cancer in her maternal grandmother; Diabetes in her father, paternal aunt, and paternal uncle; Heart disease in her paternal aunt and paternal uncle; Hypertension in her father; Stroke in her maternal grandfather. She  reports that she has never smoked. She has never used smokeless tobacco. She reports that she does not drink alcohol and does not use drugs. She has a current medication list which includes the following prescription(s): acetaminophen, b complex-c, biotin, calcium, diphenhydramine, docusate sodium, gabapentin, move free joint health advance, melatonin, meloxicam, metronidazole (topical), multivitamin with minerals, NON FORMULARY, NON FORMULARY, prednisone, align, rhubarb, thyroid, turmeric,  venlafaxine xr, estradiol, and [DISCONTINUED] montelukast. She is allergic to doxycycline, ciprofloxacin, and sulfa antibiotics.       Review of Systems:  Review of Systems  Constitutional: Denied constitutional symptoms, night sweats, recent illness, fatigue, fever, insomnia and weight loss.  Eyes: Denied eye symptoms, eye pain, photophobia, vision change and visual disturbance.  Ears/Nose/Throat/Neck: Denied ear, nose, throat or neck symptoms, hearing loss, nasal discharge, sinus congestion and sore throat.  Cardiovascular: Denied cardiovascular symptoms, arrhythmia, chest pain/pressure, edema, exercise intolerance, orthopnea and palpitations.  Respiratory: Denied pulmonary symptoms, asthma, pleuritic pain, productive sputum, cough, dyspnea and wheezing.  Gastrointestinal: Denied, gastro-esophageal reflux, melena, nausea and vomiting.  Genitourinary: Denied genitourinary symptoms including symptomatic vaginal discharge, pelvic relaxation issues, and urinary complaints.  Musculoskeletal: Denied musculoskeletal symptoms, stiffness, swelling, muscle weakness and myalgia.  Dermatologic: Denied dermatology symptoms, rash and scar.  Neurologic: Denied neurology symptoms, dizziness, headache, neck pain and syncope.  Psychiatric: Denied psychiatric symptoms, anxiety and depression.  Endocrine: Denied endocrine symptoms including hot flashes and night sweats.   Meds:   Current Outpatient Medications on File Prior to Visit  Medication Sig Dispense Refill   Acetaminophen (TYLENOL ARTHRITIS PAIN PO) Take 2 tablets by mouth daily as needed (pain).     B COMPLEX-C PO Take by mouth.     Biotin 32440 MCG TABS Take by mouth.     Ca & Phos-Vit D-Mag (CALCIUM) [id number] TABS Take by mouth.     diphenhydrAMINE (BENADRYL) 25 MG tablet Take 25 mg by mouth every 6 (six) hours as needed.     docusate sodium (COLACE) 250 MG capsule Take 250 mg by mouth daily.     gabapentin (NEURONTIN) 300 MG capsule TAKE  1 CAPSULE BY MOUTH TWICE  DAILY  180 capsule 1   Glucos-Chond-Hyal Ac-Ca Fructo (MOVE FREE JOINT HEALTH ADVANCE) TABS Take by mouth.     Melatonin 10 MG TABS Take by mouth.     meloxicam (MOBIC) 15 MG tablet Take 1 tablet (15 mg total) by mouth daily as needed for pain. 30 tablet 2   METRONIDAZOLE, TOPICAL, 0.75 % LOTN Apply 1 application topically 2 (two) times daily. 59 mL 3   Multiple Vitamins-Minerals (MULTIVITAMIN WITH MINERALS) tablet Take 1 tablet by mouth daily.     NON FORMULARY      NON FORMULARY Balanced Babe (Mood Swing, Breast Health, and Bloating)     predniSONE (DELTASONE) 50 MG tablet Take 1 tablet (50 mg total) by mouth daily with breakfast. 5 tablet 0   Probiotic Product (ALIGN) 4 MG CAPS Take 1 capsule by mouth daily. 30 capsule 6   Rhubarb (ESTROVEN COMPLETE PO) Take by mouth.     thyroid (ARMOUR) 60 MG tablet Take 1 tablet by mouth daily. Hillary Blackwood     Turmeric 500 MG CAPS Take by mouth.     venlafaxine XR (EFFEXOR-XR) 37.5 MG 24 hr capsule Take 1 capsule (37.5 mg total) by mouth daily with breakfast. 90 capsule 1   [DISCONTINUED] montelukast (SINGULAIR) 10 MG tablet Take 1 tablet (10 mg total) by mouth at bedtime. 30 tablet 3   No current facility-administered medications on file prior to visit.      Objective:     Vitals:   04/03/23 0937  BP: 111/76  Pulse: 66   Filed Weights   04/03/23 0937  Weight: 186 lb 3.2 oz (84.5 kg)                        Assessment:    [id number] Patient Active Problem List   Diagnosis Date Noted   Osteoarthritis of spine with radiculopathy, cervical region 05/23/2020   Chronic neck pain 05/23/2020   Cervical spinal stenosis 05/23/2020   Chronic heel pain, left 05/23/2020   Primary osteoarthritis of first carpometacarpal joint of right hand 05/23/2020   Chronic thumb pain, right 05/23/2020   Cervical disc disorder with radiculopathy 10/23/2017   Insomnia 08/24/2015   Stress incontinence in female 08/24/2015   FH:  diabetes mellitus 08/24/2015   S/P laparoscopic assisted vaginal hysterectomy (LAVH) 06/06/2015   Rosacea 02/24/2015   Hypothyroidism 01/26/2015   Systolic murmur 01/25/2015   IBS (irritable bowel syndrome) 06/13/2011   Hemorrhoid 06/04/2011     1. Hormone replacement therapy (HRT)   2. Menopausal symptoms     Patient doing very well on ERT.   Plan:            1.  Continue ERT  2.  Follow-up 6 months for annual examination. Orders No orders of the defined types were placed in this encounter.    Meds ordered this encounter  Medications   estradiol (ESTRACE) 1 MG tablet    Sig: Take 1 tablet (1 mg total) by mouth daily.    Dispense:  90 tablet    Refill:  3    Please send a replace/new response with 90-Day Supply if appropriate to maximize member benefit. Requesting 1 year supply.      F/U  Return in about 6 months (around 10/01/2023) for Annual Physical.  Elonda Husky, M.D. 04/03/2023 10:23 AM

## 2023-04-03 NOTE — Progress Notes (Signed)
Patient presents today to follow-up on HRT. She states doing much better with the HRT, her symptoms seem to be resolved.

## 2023-04-28 ENCOUNTER — Ambulatory Visit
Admission: RE | Admit: 2023-04-28 | Discharge: 2023-04-28 | Disposition: A | Discharge status: Home / Self Care | Payer: 59 | Source: Ambulatory Visit | Attending: Family Medicine | Admitting: Family Medicine

## 2023-04-28 DIAGNOSIS — Z1231 Encounter for screening mammogram for malignant neoplasm of breast: Secondary | ICD-10-CM | POA: Insufficient documentation

## 2023-04-30 ENCOUNTER — Other Ambulatory Visit: Payer: Self-pay | Admitting: Family Medicine

## 2023-04-30 DIAGNOSIS — R928 Other abnormal and inconclusive findings on diagnostic imaging of breast: Secondary | ICD-10-CM

## 2023-05-09 ENCOUNTER — Ambulatory Visit
Admission: RE | Admit: 2023-05-09 | Discharge: 2023-05-09 | Disposition: A | Discharge status: Home / Self Care | Payer: 59 | Source: Ambulatory Visit | Attending: Family Medicine | Admitting: Family Medicine

## 2023-05-09 DIAGNOSIS — R928 Other abnormal and inconclusive findings on diagnostic imaging of breast: Secondary | ICD-10-CM | POA: Diagnosis present

## 2023-07-22 ENCOUNTER — Other Ambulatory Visit: Payer: Self-pay | Admitting: Family Medicine

## 2023-07-22 DIAGNOSIS — N951 Menopausal and female climacteric states: Secondary | ICD-10-CM

## 2023-07-23 ENCOUNTER — Encounter: Payer: Self-pay | Admitting: Family Medicine

## 2023-07-23 NOTE — Telephone Encounter (Signed)
 Reordered 03/28/23 #90 1 RF   Requested Prescriptions  Refused Prescriptions Disp Refills   venlafaxine XR (EFFEXOR-XR) 37.5 MG 24 hr capsule [Pharmacy Med Name: Venlafaxine HCl ER 37.5 MG Oral Capsule Extended Release 24 Hour] 90 capsule 3    Sig: TAKE 1 CAPSULE BY MOUTH DAILY  WITH BREAKFAST     Psychiatry: Antidepressants - SNRI - desvenlafaxine & venlafaxine Failed - 07/23/2023 11:56 AM      Failed - Cr in normal range and within 360 days    Creat  Date Value Ref Range Status  12/18/2020 0.81 0.50 - 1.03 mg/dL Final         Failed - Valid encounter within last 6 months    Recent Outpatient Visits           8 months ago Postmenopausal syndrome   [place] [facility] Smitty Cords, DO   1 year ago Acute non-recurrent frontal sinusitis   [place] [facility] Smitty Cords, DO   2 years ago Chronic pain of right thumb   [facility] Smitty Cords, DO   2 years ago Annual physical exam   [place] [facility] [place], Netta Neat, DO   2 years ago Osteoarthritis of spine with radiculopathy, cervical region   [facility] [place], Netta Neat, DO              Failed - Lipid Panel in normal range within the last 12 months    Cholesterol, Total  Date Value Ref Range Status  04/07/2019 169 100 - 199 mg/dL Final   Cholesterol  Date Value Ref Range Status  12/18/2020 194 <200 mg/dL Final   LDL Cholesterol (Calc)  Date Value Ref Range Status  12/18/2020 115 (H) mg/dL (calc) Final    Comment:    Reference range: <100 . Desirable range <100 mg/dL for primary prevention;   <70 mg/dL for patients with CHD or diabetic patients  with > or = 2 CHD risk factors. Marland Kitchen LDL-C is now calculated using the Martin-Hopkins  calculation, which is a validated novel method providing  better accuracy than the Friedewald equation in the   estimation of LDL-C.  Horald Pollen et al. Lenox Ahr. 5409;811(91): 2061-2068  (http://education.[facility].com/faq/FAQ164)    HDL  Date Value Ref Range Status  12/18/2020 56 > OR = 50 mg/dL Final  47/82/9562 48 >13 mg/dL Final   Triglycerides  Date Value Ref Range Status  12/18/2020 123 <150 mg/dL Final         Passed - Last BP in normal range    BP Readings from Last 1 Encounters:  04/03/23 111/76

## 2023-08-02 ENCOUNTER — Other Ambulatory Visit: Payer: Self-pay | Admitting: Family Medicine

## 2023-08-02 DIAGNOSIS — N951 Menopausal and female climacteric states: Secondary | ICD-10-CM

## 2023-08-04 NOTE — Telephone Encounter (Signed)
 Requested Prescriptions  Refused Prescriptions Disp Refills   venlafaxine XR (EFFEXOR-XR) 37.5 MG 24 hr capsule [Pharmacy Med Name: Venlafaxine HCl ER 37.5 MG Oral Capsule Extended Release 24 Hour] 90 capsule 3    Sig: TAKE 1 CAPSULE BY MOUTH DAILY  WITH BREAKFAST     Psychiatry: Antidepressants - SNRI - desvenlafaxine & venlafaxine Failed - 08/04/2023  4:00 PM      Failed - Cr in normal range and within 360 days    Creat  Date Value Ref Range Status  12/18/2020 0.81 0.50 - 1.03 mg/dL Final         Failed - Valid encounter within last 6 months    Recent Outpatient Visits           9 months ago Postmenopausal syndrome   [place] [facility] Smitty Cords, DO   1 year ago Acute non-recurrent frontal sinusitis   [place] [facility] Smitty Cords, DO   2 years ago Chronic pain of right thumb   [place] [facility] Smitty Cords, DO   2 years ago Annual physical exam   [place] [facility] [place], Netta Neat, DO   2 years ago Osteoarthritis of spine with radiculopathy, cervical region   [facility] [place], Netta Neat, DO              Failed - Lipid Panel in normal range within the last 12 months    Cholesterol, Total  Date Value Ref Range Status  04/07/2019 169 100 - 199 mg/dL Final   Cholesterol  Date Value Ref Range Status  12/18/2020 194 <200 mg/dL Final   LDL Cholesterol (Calc)  Date Value Ref Range Status  12/18/2020 115 (H) mg/dL (calc) Final    Comment:    Reference range: <100 . Desirable range <100 mg/dL for primary prevention;   <70 mg/dL for patients with CHD or diabetic patients  with > or = 2 CHD risk factors. Marland Kitchen LDL-C is now calculated using the Martin-Hopkins  calculation, which is a validated novel method providing  better accuracy than the Friedewald equation in the  estimation of LDL-C.  Horald Pollen et  al. Lenox Ahr. 4098;119(14): 2061-2068  (http://education.[facility].com/faq/FAQ164)    HDL  Date Value Ref Range Status  12/18/2020 56 > OR = 50 mg/dL Final  78/29/5621 48 >30 mg/dL Final   Triglycerides  Date Value Ref Range Status  12/18/2020 123 <150 mg/dL Final         Passed - Last BP in normal range    BP Readings from Last 1 Encounters:  04/03/23 111/76

## 2023-08-11 ENCOUNTER — Encounter: Payer: Self-pay | Admitting: Family Medicine

## 2023-08-11 ENCOUNTER — Ambulatory Visit (INDEPENDENT_AMBULATORY_CARE_PROVIDER_SITE_OTHER): Admitting: Family Medicine

## 2023-08-11 VITALS — BP 120/80 | HR 67 | Ht 67.0 in | Wt 187.0 lb

## 2023-08-11 DIAGNOSIS — Z0000 Encounter for general adult medical examination without abnormal findings: Secondary | ICD-10-CM

## 2023-08-11 DIAGNOSIS — N951 Menopausal and female climacteric states: Secondary | ICD-10-CM | POA: Diagnosis not present

## 2023-08-11 DIAGNOSIS — L719 Rosacea, unspecified: Secondary | ICD-10-CM

## 2023-08-11 DIAGNOSIS — E039 Hypothyroidism, unspecified: Secondary | ICD-10-CM | POA: Diagnosis not present

## 2023-08-11 DIAGNOSIS — E7800 Pure hypercholesterolemia, unspecified: Secondary | ICD-10-CM

## 2023-08-11 DIAGNOSIS — R7309 Other abnormal glucose: Secondary | ICD-10-CM

## 2023-08-11 MED ORDER — METRONIDAZOLE 0.75 % EX LOTN: 1.0000 "application " | TOPICAL_LOTION | Freq: Every day | CUTANEOUS | 2 refills | Status: AC | PRN

## 2023-08-11 MED ORDER — VENLAFAXINE HCL ER 37.5 MG PO CP24: 37.5000 mg | ORAL_CAPSULE | Freq: Every day | ORAL | 3 refills | Status: AC

## 2023-08-11 NOTE — Progress Notes (Signed)
 Subjective:    Patient ID: [name], female    DOB: 22-May-1965, 58 y.o.   MRN: [id number]  [name] is a 58 y.o. female presenting on 08/11/2023 for Annual Exam   HPI  Discussed the use of AI scribe software for clinical note transcription with the patient, who gave verbal consent to proceed.  History of Present Illness   [name] is a 58 year old female who presents for an annual physical exam.  It has been two to three years since her last routine check-up. She has completed her shingles vaccine series and received her flu shot at [facility]. Her last colon screening was in 2021. Her last mammogram was done at the end of December, and she is keeping up with yearly screenings. She plans to have blood work done soon.  She has a family history of colon cancer, with her mother recently diagnosed with stage one colon cancer and her grandmother also having had colon cancer. Additionally, her great-grandmother reportedly had an abdominal tumor. She is aware of the importance of regular screenings given this family history.  She has a history of rosacea and uses metronidazole topical lotion. Her last prescription expired in 2019, and she has been using it two to three times a week as needed. She initially used it daily along with tetracycline when her condition was more active.  Post Menopausal Syndrome Mood / Hot flashes She is currently on Effexor, which she started last year during a difficult period. She reports significant improvement in her mood and energy levels, attributing this to the combination of Effexor and hormone therapy. She describes feeling more like her 'old self' and is pleased with the medication's effects on her mental health.  She experiences occasional swelling in her feet and ankles, particularly after long periods on her feet or during vacations. She has spider veins and is concerned about circulation issues, especially given her father's history of  congestive heart failure.      Hypothyroidism Followed by [facility] Last checked has had labs. Normal thyroid, continues on Amour Thyroid 60mg  daily. Admits brittle nails   HYPERLIPIDEMIA: Due for lipid panel upcoming Lifestyle - Diet: major reduced red meat diet and has improved overall - Exercise: goal to resume more   Chronic Neck Pain OA DDD Cervical Spine, Spinal Stenosis    Health Maintenance:  Shingrix updated  Colonoscopy 10/2019, Dr Rhea Belton - next due in 5 years.  Prior history pre cancerous polyp then repeat in 5 yr had several polyps Mother dx with Stage I   Due for mammogram screening last 2019, she admits needs to schedule it. Order is already in [facility]D Mammo.       08/11/2023    2:13 PM 11/05/2022    1:43 PM 06/15/2021    2:15 PM  Depression screen PHQ 2/9  Decreased Interest 0 3 1  Down, Depressed, Hopeless 0 3 0  PHQ - 2 Score 0 6 1  Altered sleeping 3 3 3   Tired, decreased energy 1 3 3   Change in appetite 1 3 3   Feeling bad or failure about yourself  0 3 0  Trouble concentrating 1 3 3   Moving slowly or fidgety/restless 0 0 0  Suicidal thoughts 0 0 0  PHQ-9 Score 6 21 13   Difficult doing work/chores Somewhat difficult Extremely dIfficult Not difficult at all       08/11/2023    2:14 PM 11/05/2022    1:43 PM 06/15/2021  2:15 PM 12/25/2020    3:54 PM  GAD 7 : Generalized Anxiety Score  Nervous, Anxious, on Edge 0 0 0 0  Control/stop worrying 0 3 0 0  Worry too much - different things 0 0 0 0  Trouble relaxing 0 0 1 0  Restless 0 2 0 0  Easily annoyed or irritable 0 0 1 0  Afraid - awful might happen 0 0 0 0  Total GAD 7 Score 0 5 2 0  Anxiety Difficulty  Not difficult at all Not difficult at all Not difficult at all     Past Medical History:  Diagnosis Date   Abnormal Pap smear 2002   LGSIL   Allergy    Arthritis    neck   Constipation    1 bm every 3-4 days - hard stools    GERD (gastroesophageal reflux disease)    when on a  medicine for uterine bleeding   Hemorrhoid    History of blood transfusion    for uterine bleeding; now resolved   Hypothyroidism    Shortness of breath dyspnea    with exercise since starting megace    Past Surgical History:  Procedure Laterality Date   BREAST BIOPSY Left 1995   neg   BREAST SURGERY  1994   left breast bx   COLONOSCOPY     COLONOSCOPY W/ BIOPSIES  2013   divert. cleared for 5 yrs- [facility]   COLPOSCOPY  2002   ABNORMAL PAP   CYSTOSCOPY  06/06/2015   Procedure: CYSTOSCOPY;  Surgeon: Tereso Newcomer, MD;  Location: [facility];  Service: Gynecology;;   DILATION AND CURETTAGE OF UTERUS     TAB   LAPAROSCOPIC VAGINAL HYSTERECTOMY WITH SALPINGO OOPHORECTOMY N/A 06/06/2015   Procedure: LAPAROSCOPIC ASSISTED VAGINAL HYSTERECTOMY ;  Surgeon: Tereso Newcomer, MD;  Location: [facility];  Service: Gynecology;  Laterality: N/A;  722.9grams    LEEP  2002   POLYPECTOMY     TUBAL LIGATION     VAGINAL HYSTERECTOMY  05/2015   Social History   Socioeconomic History   Marital status: Married    Spouse name: Not on file   Number of children: 2   Years of education: Not on file   Highest education level: Not on file  Occupational History   Occupation: Fish farm manager: [employer]  Tobacco Use   Smoking status: Never   Smokeless tobacco: Never  Vaping Use   Vaping status: Never Used  Substance and Sexual Activity   Alcohol use: No    Alcohol/week: 0.0 standard drinks of alcohol   Drug use: No   Sexual activity: Not Currently    Partners: Male    Birth control/protection: Surgical  Other Topics Concern   Not on file  Social History Narrative   Not on file   Social Drivers of Health   Financial Resource Strain: Not on file  Food Insecurity: Not on file  Transportation Needs: Not on file  Physical Activity: Not on file  Stress: Not on file  Social Connections: Not on file  Intimate Partner Violence: Not on file   Family History  Problem  Relation Age of Onset   Hypertension Father    Diabetes Father    Cancer Maternal Grandmother        female cancer   Stroke Maternal Grandfather    Diabetes Paternal Aunt    Heart disease Paternal Aunt    Diabetes Paternal Uncle  Heart disease Paternal Uncle    Colon cancer Neg Hx    Esophageal cancer Neg Hx    Stomach cancer Neg Hx    Prostate cancer Neg Hx    Kidney cancer Neg Hx    Bladder Cancer Neg Hx    Breast cancer Neg Hx    Colon polyps Neg Hx    Current Outpatient Medications on File Prior to Visit  Medication Sig   Acetaminophen (TYLENOL ARTHRITIS PAIN PO) Take 2 tablets by mouth daily as needed (pain).   diphenhydrAMINE (BENADRYL) 25 MG tablet Take 25 mg by mouth every 6 (six) hours as needed.   docusate sodium (COLACE) 250 MG capsule Take 250 mg by mouth daily.   estradiol (ESTRACE) 1 MG tablet Take 1 tablet (1 mg total) by mouth daily.   gabapentin (NEURONTIN) 300 MG capsule TAKE 1 CAPSULE BY MOUTH TWICE  DAILY   Glucos-Chond-Hyal Ac-Ca Fructo (MOVE FREE JOINT HEALTH ADVANCE) TABS Take by mouth.   Melatonin 10 MG TABS Take by mouth.   Multiple Vitamins-Minerals (MULTIVITAMIN WITH MINERALS) tablet Take 1 tablet by mouth daily.   NON FORMULARY    Probiotic Product (ALIGN) 4 MG CAPS Take 1 capsule by mouth daily.   thyroid (ARMOUR) 60 MG tablet Take 1 tablet by mouth daily. Hillary Blackwood   B COMPLEX-C PO Take by mouth. (Patient not taking: Reported on 08/11/2023)   Biotin 19147 MCG TABS Take by mouth. (Patient not taking: Reported on 08/11/2023)   Ca & Phos-Vit D-Mag (CALCIUM) [phone] TABS Take by mouth. (Patient not taking: Reported on 08/11/2023)   meloxicam (MOBIC) 15 MG tablet Take 1 tablet (15 mg total) by mouth daily as needed for pain. (Patient not taking: Reported on 08/11/2023)   NON FORMULARY Balanced Babe (Mood Swing, Breast Health, and Bloating) (Patient not taking: Reported on 08/11/2023)   Rhubarb (ESTROVEN COMPLETE PO) Take by mouth. (Patient not  taking: Reported on 08/11/2023)   Turmeric 500 MG CAPS Take by mouth. (Patient not taking: Reported on 08/11/2023)   [DISCONTINUED] montelukast (SINGULAIR) 10 MG tablet Take 1 tablet (10 mg total) by mouth at bedtime.   No current facility-administered medications on file prior to visit.    Review of Systems  Constitutional:  Negative for activity change, appetite change, chills, diaphoresis, fatigue and fever.  HENT:  Negative for congestion and hearing loss.   Eyes:  Negative for visual disturbance.  Respiratory:  Negative for cough, chest tightness, shortness of breath and wheezing.   Cardiovascular:  Negative for chest pain, palpitations and leg swelling.  Gastrointestinal:  Negative for abdominal pain, constipation, diarrhea, nausea and vomiting.  Genitourinary:  Negative for dysuria, frequency and hematuria.  Musculoskeletal:  Negative for arthralgias and neck pain.  Skin:  Negative for rash.  Neurological:  Negative for dizziness, weakness, light-headedness, numbness and headaches.  Hematological:  Negative for adenopathy.  Psychiatric/Behavioral:  Negative for behavioral problems, dysphoric mood and sleep disturbance.    Per HPI unless specifically indicated above     Objective:    BP 120/80 (BP Location: Left Arm, Patient Position: Sitting, Cuff Size: Normal)   Pulse 67   Ht 5\' 7"  (1.702 m)   Wt 187 lb (84.8 kg)   LMP 12/12/2014   SpO2 98%   BMI 29.29 kg/m   Wt Readings from Last 3 Encounters:  08/11/23 187 lb (84.8 kg)  04/03/23 186 lb 3.2 oz (84.5 kg)  11/12/22 188 lb 11.2 oz (85.6 kg)    Physical Exam Vitals and nursing  note reviewed.  Constitutional:      General: She is not in acute distress.    Appearance: She is well-developed. She is not diaphoretic.     Comments: Well-appearing, comfortable, cooperative  HENT:     Head: Normocephalic and atraumatic.  Eyes:     General:        Right eye: No discharge.        Left eye: No discharge.      Conjunctiva/sclera: Conjunctivae normal.     Pupils: Pupils are equal, round, and reactive to light.  Neck:     Thyroid: No thyromegaly.     Vascular: No carotid bruit.  Cardiovascular:     Rate and Rhythm: Normal rate and regular rhythm.     Pulses: Normal pulses.     Heart sounds: Normal heart sounds. No murmur heard. Pulmonary:     Effort: Pulmonary effort is normal. No respiratory distress.     Breath sounds: Normal breath sounds. No wheezing or rales.  Abdominal:     General: Bowel sounds are normal. There is no distension.     Palpations: Abdomen is soft. There is no mass.     Tenderness: There is no abdominal tenderness.  Musculoskeletal:        General: No tenderness. Normal range of motion.     Cervical back: Normal range of motion and neck supple.     Right lower leg: No edema.     Left lower leg: No edema.     Comments: Upper / Lower Extremities: - Normal muscle tone, strength bilateral upper extremities 5/5, lower extremities 5/5  Lymphadenopathy:     Cervical: No cervical adenopathy.  Skin:    General: Skin is warm and dry.     Findings: No erythema or rash.     Comments: Varicose veins / spider veins bilateral ankle  Neurological:     Mental Status: She is alert and oriented to person, place, and time.     Comments: Distal sensation intact to light touch all extremities  Psychiatric:        Mood and Affect: Mood normal.        Behavior: Behavior normal.        Thought Content: Thought content normal.     Comments: Well groomed, good eye contact, normal speech and thoughts     Results for orders placed or performed in visit on 06/15/21  Sed Rate (ESR)   Collection Time: 06/15/21  2:58 PM  Result Value Ref Range   Sed Rate 9 0 - 30 mm/h  C-reactive protein   Collection Time: 06/15/21  2:58 PM  Result Value Ref Range   CRP 2.3 <8.0 mg/L  Cyclic citrul peptide antibody, IgG   Collection Time: 06/15/21  2:58 PM  Result Value Ref Range   Cyclic Citrullin  Peptide Ab <16 UNITS  Rheumatoid Factor   Collection Time: 06/15/21  2:58 PM  Result Value Ref Range   Rheumatoid fact SerPl-aCnc <14 <14 IU/mL  ANA   Collection Time: 06/15/21  2:58 PM  Result Value Ref Range   Anti Nuclear Antibody (ANA) NEGATIVE NEGATIVE      Assessment & Plan:   Problem List Items Addressed This Visit     Hypothyroidism   Menopausal vasomotor syndrome   Relevant Medications   venlafaxine XR (EFFEXOR-XR) 37.5 MG 24 hr capsule   Rosacea   Relevant Medications   METRONIDAZOLE, TOPICAL, 0.75 % LOTN   Other Visit Diagnoses  Annual physical exam    -  Primary     Postmenopausal syndrome       Relevant Medications   venlafaxine XR (EFFEXOR-XR) 37.5 MG 24 hr capsule     Pure hypercholesterolemia       Relevant Orders   CT CARDIAC SCORING (SELF PAY ONLY)        Updated Health Maintenance information Reviewed recent lab results with patient Encouraged improvement to lifestyle with diet and exercise Goal of weight loss  Postmenopausal Symptoms with Mood Changes Experiencing postmenopausal symptoms including mood changes and hot flashes. Effexor (venlafaxine) effectively manages symptoms, improves mood, and reduces negative thought patterns. She is satisfied with the current treatment and prefers to continue Effexor to maintain mental health status. - Continue Effexor (venlafaxine) with 90-day supply and refills through [facility].  Peripheral Edema Intermittent swelling of feet and ankles, particularly after prolonged standing or walking, likely due to venous insufficiency rather than cardiac issues, as swelling resolves with rest. Family history of congestive heart failure noted, but current symptoms are unrelated. Venous insufficiency explained as common and not harmful, unlike arterial circulation issues.  Rosacea Chronic skin condition managed with topical metronidazole. Uses medication two to three times a week as needed due to controlled symptoms.  Previous prescription expired in 2019, and a refill is needed. - Prescribe metronidazole 0.75% lotion with two extra refills, to be used daily as needed. - Send prescription to [facility] for processing.  General Health Maintenance Annual physical examination conducted. Vaccinations are up to date, including shingles and flu shots. Colon cancer screening is due next year, with a family history of colon cancer noted. Mammogram completed in December.   Blood work scheduled to assess cholesterol and blood sugar levels. Discussed potential need for a heart scan based on cardiovascular risk assessment, optional and serving as a baseline for heart and arteries, with decision to proceed based on lab results. Pneumonia vaccine eligibility discussed due to recent age guideline changes, now recommended for those 50 and above due to its effectiveness and minimal side effects. - Schedule blood work for Wednesday at 10 AM. - Order heart scan; she will decide based on blood work results. - Administer pneumonia vaccine as per new guidelines for age 52 and above. - Schedule next annual physical and blood work in advance for next year.        Orders Placed This Encounter  Procedures   CT CARDIAC SCORING (SELF PAY ONLY)    Standing Status:   Future    Expiration Date:   08/10/2024    Is patient pregnant?:   No    Preferred imaging location?:   [facility]    Meds ordered this encounter  Medications   METRONIDAZOLE, TOPICAL, 0.75 % LOTN    Sig: Apply 1 application  topically daily as needed (rosacea).    Dispense:  59 mL    Refill:  2   venlafaxine XR (EFFEXOR-XR) 37.5 MG 24 hr capsule    Sig: Take 1 capsule (37.5 mg total) by mouth daily with breakfast.    Dispense:  90 capsule    Refill:  3    N95.1     Follow up plan: Return in about 1 year (around 08/10/2024) for 1 year fasting lab > 1 week later Annual Physical.  Labs for 08/13/23 at 10am - do not check thyroid  Saralyn Pilar,  DO [facility] 08/11/2023, 2:18 PM

## 2023-08-11 NOTE — Patient Instructions (Addendum)
 Thank you for coming to the office today.  Consider Prevnar-20 vaccine whenever ready. It can be done now anytime after 50+  Coronary Calcium Score Cardiac CT Scan. This is a screening test for patients aged 59-50+ with cardiovascular risk factors or who are healthy but would be interested in Cardiovascular Screening for heart disease. Even if there is a family history of heart disease, this imaging can be useful. Typically it can be done every 5+ years or at a different timeline we agree on  The scan will look at the chest and mainly focus on the heart and identify early signs of calcium build up or blockages within the heart arteries. It is not 100% accurate for identifying blockages or heart disease, but it is useful to help [place] predict who may have some early changes or be at risk in the future for a heart attack or cardiovascular problem.  The results are reviewed by a Cardiologist and they will document the results. It should become available on MyChart. Typically the results are divided into percentiles based on other patients of the same demographic and age. So it will compare your risk to others similar to you. If you have a higher score >99 or higher percentile >75%tile, it is recommended to consider Statin cholesterol therapy and or referral to Cardiologist. I will try to help explain your results and if we have questions we can contact the Cardiologist.  You will be contacted for scheduling. Usually it is done at any imaging facility through [facility], [facility] or [facility].  The cost is $99 flat fee total and it does not go through insurance, so no authorization is required.  DUE for FASTING BLOOD WORK (no food or drink after midnight before the lab appointment, only water or coffee without cream/sugar on the morning of)  SCHEDULE "Lab Only" visit in the morning at the clinic for lab draw in 1 YEAR  - Make sure Lab Only appointment is at about 1  week before your next appointment, so that results will be available  For Lab Results, once available within 2-3 days of blood draw, you can can log in to MyChart online to view your results and a brief explanation. Also, we can discuss results at next follow-up visit.   Please schedule a Follow-up Appointment to: Return in about 1 year (around 08/10/2024) for 1 year fasting lab > 1 week later Annual Physical.  If you have any other questions or concerns, please feel free to call the office or send a message through MyChart. You may also schedule an earlier appointment if necessary.  Additionally, you may be receiving a survey about your experience at our office within a few days to 1 week by e-mail or mail. We value your feedback.  Saralyn Pilar, DO [facility], [place]

## 2023-08-13 ENCOUNTER — Other Ambulatory Visit

## 2023-08-13 DIAGNOSIS — Z0000 Encounter for general adult medical examination without abnormal findings: Secondary | ICD-10-CM

## 2023-08-13 DIAGNOSIS — R7309 Other abnormal glucose: Secondary | ICD-10-CM

## 2023-08-13 DIAGNOSIS — N951 Menopausal and female climacteric states: Secondary | ICD-10-CM

## 2023-08-13 DIAGNOSIS — E7800 Pure hypercholesterolemia, unspecified: Secondary | ICD-10-CM

## 2023-08-14 ENCOUNTER — Encounter: Payer: Self-pay | Admitting: Family Medicine

## 2023-08-14 LAB — CBC WITH DIFFERENTIAL/PLATELET
Absolute Lymphocytes: 1632 {cells}/uL (ref 850–3900)
Absolute Monocytes: 339 {cells}/uL (ref 200–950)
Basophils Absolute: 101 {cells}/uL (ref 0–200)
Basophils Relative: 1.9 %
Eosinophils Absolute: 451 {cells}/uL (ref 15–500)
Eosinophils Relative: 8.5 %
HCT: 40.1 % (ref 35.0–45.0)
Hemoglobin: 13.7 g/dL (ref 11.7–15.5)
MCH: 31.1 pg (ref 27.0–33.0)
MCHC: 34.2 g/dL (ref 32.0–36.0)
MCV: 90.9 fL (ref 80.0–100.0)
MPV: 10.7 fL (ref 7.5–12.5)
Monocytes Relative: 6.4 %
Neutro Abs: 2777 {cells}/uL (ref 1500–7800)
Neutrophils Relative %: 52.4 %
Platelets: 185 10*3/uL (ref 140–400)
RBC: 4.41 10*6/uL (ref 3.80–5.10)
RDW: 12.8 % (ref 11.0–15.0)
Total Lymphocyte: 30.8 %
WBC: 5.3 10*3/uL (ref 3.8–10.8)

## 2023-08-14 LAB — COMPLETE METABOLIC PANEL WITHOUT GFR
AG Ratio: 1.9 (calc) (ref 1.0–2.5)
ALT: 9 U/L (ref 6–29)
AST: 15 U/L (ref 10–35)
Albumin: 3.9 g/dL (ref 3.6–5.1)
Alkaline phosphatase (APISO): 62 U/L (ref 37–153)
BUN: 9 mg/dL (ref 7–25)
CO2: 27 mmol/L (ref 20–32)
Calcium: 8.5 mg/dL — ABNORMAL LOW (ref 8.6–10.4)
Chloride: 108 mmol/L (ref 98–110)
Creat: 0.88 mg/dL (ref 0.50–1.03)
Globulin: 2.1 g/dL (ref 1.9–3.7)
Glucose, Bld: 91 mg/dL (ref 65–99)
Potassium: 3.7 mmol/L (ref 3.5–5.3)
Sodium: 142 mmol/L (ref 135–146)
Total Bilirubin: 0.4 mg/dL (ref 0.2–1.2)
Total Protein: 6 g/dL — ABNORMAL LOW (ref 6.1–8.1)

## 2023-08-14 LAB — HEMOGLOBIN A1C
Hgb A1c MFr Bld: 5.2 %{Hb} (ref <5.7)
Mean Plasma Glucose: 103 mg/dL
eAG (mmol/L): 5.7 mmol/L

## 2023-08-14 LAB — LIPID PANEL
Cholesterol: 184 mg/dL (ref <200)
HDL: 45 mg/dL — ABNORMAL LOW (ref >=50)
LDL Cholesterol (Calc): 110 mg/dL — ABNORMAL HIGH
Non-HDL Cholesterol (Calc): 139 mg/dL — ABNORMAL HIGH (ref <130)
Total CHOL/HDL Ratio: 4.1 (calc) (ref <5.0)
Triglycerides: 170 mg/dL — ABNORMAL HIGH (ref <150)

## 2023-08-15 ENCOUNTER — Ambulatory Visit
Admission: RE | Admit: 2023-08-15 | Discharge: 2023-08-15 | Disposition: A | Discharge status: Home / Self Care | Payer: Self-pay | Source: Ambulatory Visit | Attending: Family Medicine | Admitting: Family Medicine

## 2023-08-15 ENCOUNTER — Encounter: Payer: Self-pay | Admitting: Family Medicine

## 2023-08-15 DIAGNOSIS — E7800 Pure hypercholesterolemia, unspecified: Secondary | ICD-10-CM | POA: Insufficient documentation

## 2023-09-21 ENCOUNTER — Other Ambulatory Visit: Payer: Self-pay | Admitting: Internal Medicine

## 2023-09-21 DIAGNOSIS — G8929 Other chronic pain: Secondary | ICD-10-CM

## 2023-09-21 DIAGNOSIS — M1811 Unilateral primary osteoarthritis of first carpometacarpal joint, right hand: Secondary | ICD-10-CM

## 2023-09-21 DIAGNOSIS — M4802 Spinal stenosis, cervical region: Secondary | ICD-10-CM

## 2023-09-23 NOTE — Telephone Encounter (Signed)
 Requested Prescriptions  Pending Prescriptions Disp Refills   gabapentin  (NEURONTIN ) 300 MG capsule [Pharmacy Med Name: GABAPENTIN  CAP 300MG  (NEUR)] 180 capsule 2    Sig: TAKE 1 CAPSULE BY MOUTH TWICE  DAILY     Neurology: Anticonvulsants - gabapentin  Failed - 09/23/2023  2:22 PM      Failed - Valid encounter within last 12 months    Recent Outpatient Visits           1 month ago Annual physical exam   [facility] [place], Kayleen Party, DO              Passed - Cr in normal range and within 360 days    Creat  Date Value Ref Range Status  08/13/2023 0.88 0.50 - 1.03 mg/dL Final         Passed - Completed PHQ-2 or PHQ-9 in the last 360 days

## 2023-12-11 IMAGING — DX DG FINGER THUMB 2+V*R*
3 series · 3 of 3 positions shown · non-contrast
Comparison: None.

CLINICAL DATA: Chronic right thumb/carpometacarpal joint pain for
several years.

EXAM:
RIGHT THUMB 2+V

[finger ap (1 of 2)]
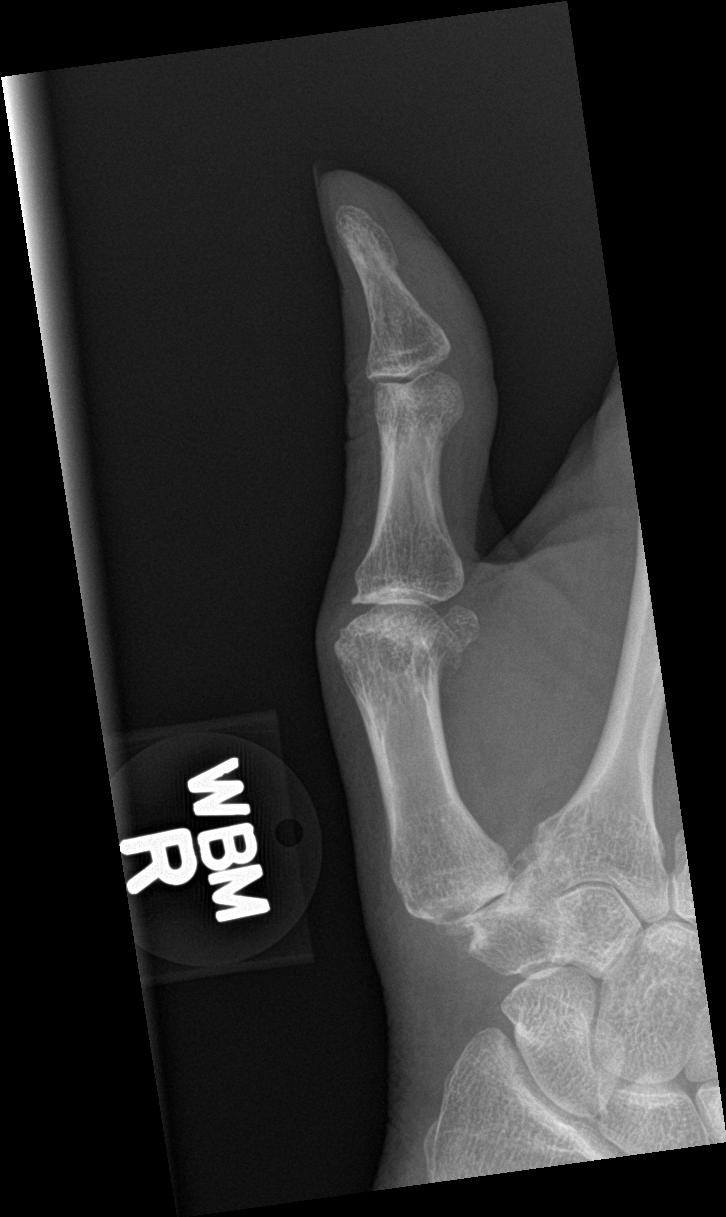

[finger lat]
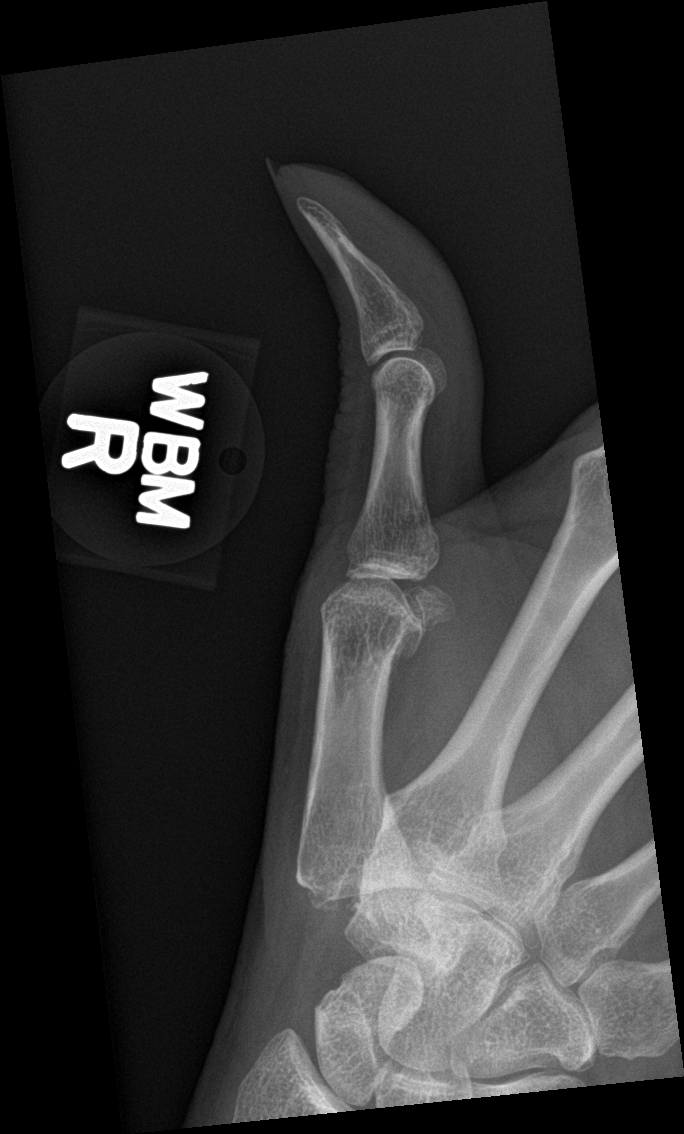

[finger ap (2 of 2)]
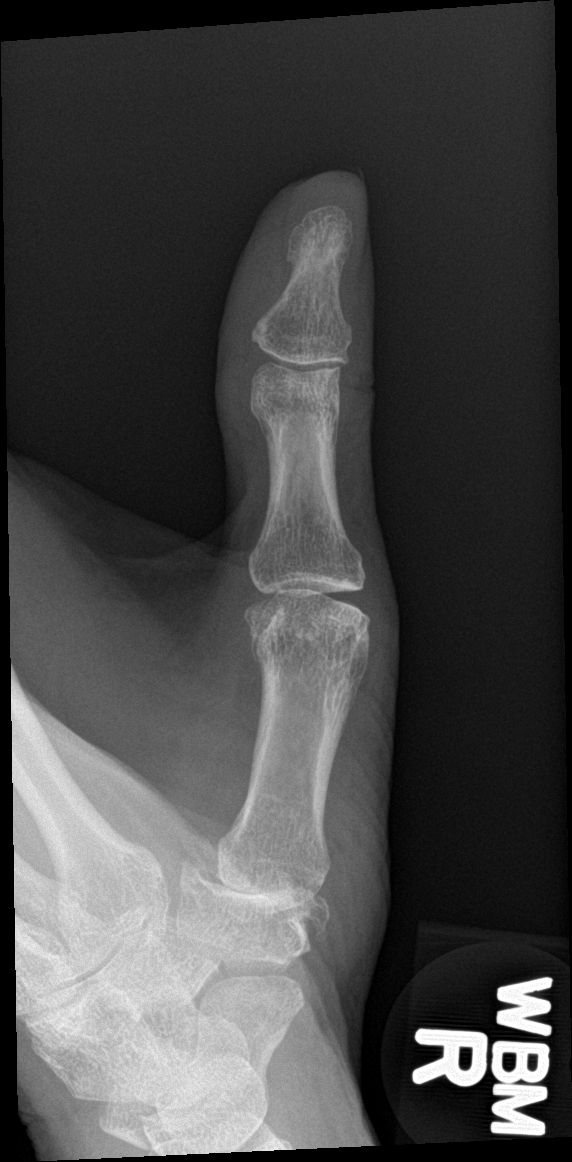

[3 of 3 positions shown; findings below may reference images not displayed]

FINDINGS: There is normal bone mineralization without evidence of fractures.

There is bone-on-bone joint space loss of the thumb CMC joint with
moderate reactive osteophytosis and small loose bodies in the
lateral joint space. 4 mm of lateral subluxation of the base of the
thumb metacarpal is seen most likely on a degenerative basis with
otherwise normal osseous alignment.

There is mild narrowing and spurring of the first MCP and thumb IP
joints.

There is no erosive arthropathy. The triscaphe and second CMC joints
are unremarkable.
IMPRESSION: Changes of osteoarthritis of the first ray, most advanced in the CMC
joint, without evidence of erosive arthropathy.

## 2024-03-20 ENCOUNTER — Other Ambulatory Visit: Payer: Self-pay | Admitting: Obstetrics and Gynecology

## 2024-03-20 DIAGNOSIS — N951 Menopausal and female climacteric states: Secondary | ICD-10-CM

## 2024-03-22 ENCOUNTER — Other Ambulatory Visit: Payer: Self-pay | Admitting: Obstetrics and Gynecology

## 2024-03-22 DIAGNOSIS — N951 Menopausal and female climacteric states: Secondary | ICD-10-CM

## 2024-04-02 ENCOUNTER — Ambulatory Visit (INDEPENDENT_AMBULATORY_CARE_PROVIDER_SITE_OTHER): Admitting: Licensed Practical Nurse

## 2024-04-02 ENCOUNTER — Encounter: Payer: Self-pay | Admitting: Licensed Practical Nurse

## 2024-04-02 VITALS — BP 125/77 | HR 66 | Ht 67.0 in | Wt 194.4 lb

## 2024-04-02 DIAGNOSIS — N951 Menopausal and female climacteric states: Secondary | ICD-10-CM | POA: Diagnosis not present

## 2024-04-02 MED ORDER — ESTRADIOL 1 MG PO TABS: 1.0000 mg | ORAL_TABLET | Freq: Every day | ORAL | 3 refills | Status: AC

## 2024-04-02 NOTE — Progress Notes (Signed)
 Edman Marsa PARAS, DO   Chief Complaint  Patient presents with   Follow-up    HPI:      [name] is a 58 y.o. [id number] whose LMP was Patient's last menstrual period was 12/12/2014., presents today for HRT follow up. Has been  on 1mg  estadiol, she is happy with this and would like to continue.   Denies hot flashes and night sweats,  Has  hair loss, dry skin, ringing in the ears, and some arthritis   Had hysterectomy at age 77,   Does strength training   and cardio      Patient Active Problem List   Diagnosis Date Noted   Menopausal vasomotor syndrome 08/11/2023   Osteoarthritis of spine with radiculopathy, cervical region 05/23/2020   Chronic neck pain 05/23/2020   Cervical spinal stenosis 05/23/2020   Chronic heel pain, left 05/23/2020   Primary osteoarthritis of first carpometacarpal joint of right hand 05/23/2020   Chronic thumb pain, right 05/23/2020   Cervical disc disorder with radiculopathy 10/23/2017   Insomnia 08/24/2015   Stress incontinence in female 08/24/2015   FH: diabetes mellitus 08/24/2015   S/P laparoscopic assisted vaginal hysterectomy (LAVH) 06/06/2015   Rosacea 02/24/2015   Hypothyroidism 01/26/2015   Systolic murmur 01/25/2015   IBS (irritable bowel syndrome) 06/13/2011   Hemorrhoid 06/04/2011    Past Surgical History:  Procedure Laterality Date   BREAST BIOPSY Left 1995   neg   BREAST SURGERY  1994   left breast bx   COLONOSCOPY     COLONOSCOPY W/ BIOPSIES  2013   divert. cleared for 5 yrs- [facility]   COLPOSCOPY  2002   ABNORMAL PAP   CYSTOSCOPY  06/06/2015   Procedure: CYSTOSCOPY;  Surgeon: Gloris DELENA Hugger, MD;  Location: [facility];  Service: Gynecology;;   DILATION AND CURETTAGE OF UTERUS     TAB   LAPAROSCOPIC VAGINAL HYSTERECTOMY WITH SALPINGO OOPHORECTOMY N/A 06/06/2015   Procedure: LAPAROSCOPIC ASSISTED VAGINAL HYSTERECTOMY ;  Surgeon: Gloris DELENA Hugger, MD;  Location: [facility];  Service: Gynecology;  Laterality: N/A;   722.9grams    LEEP  2002   POLYPECTOMY     TUBAL LIGATION     VAGINAL HYSTERECTOMY  05/2015    Family History  Problem Relation Age of Onset   Colon cancer Mother 41       stage 1   Hypertension Father    Diabetes Father    Diabetes Paternal Aunt    Heart disease Paternal Aunt    Diabetes Paternal Uncle    Heart disease Paternal Uncle    Cancer Maternal Grandmother        female cancer   Stroke Maternal Grandfather    Esophageal cancer Neg Hx    Stomach cancer Neg Hx    Prostate cancer Neg Hx    Kidney cancer Neg Hx    Bladder Cancer Neg Hx    Breast cancer Neg Hx    Colon polyps Neg Hx     Social History   Socioeconomic History   Marital status: Married    Spouse name: Not on file   Number of children: 2   Years of education: Not on file   Highest education level: Not on file  Occupational History   Occupation: Fish Farm Manager: [employer]  Tobacco Use   Smoking status: Never   Smokeless tobacco: Never  Vaping Use   Vaping status: Never Used  Substance and Sexual Activity  Alcohol use: No    Alcohol/week: 0.0 standard drinks of alcohol   Drug use: No   Sexual activity: Not Currently    Partners: Male    Birth control/protection: Surgical  Other Topics Concern   Not on file  Social History Narrative   Not on file   Social Drivers of Health   Financial Resource Strain: Not on file  Food Insecurity: Not on file  Transportation Needs: Not on file  Physical Activity: Not on file  Stress: Not on file  Social Connections: Not on file  Intimate Partner Violence: Not on file    Outpatient Medications Prior to Visit  Medication Sig Dispense Refill   Acetaminophen  (TYLENOL  ARTHRITIS PAIN PO) Take 2 tablets by mouth daily as needed (pain).     diphenhydrAMINE  (BENADRYL ) 25 MG tablet Take 25 mg by mouth every 6 (six) hours as needed.     docusate sodium  (COLACE) 250 MG capsule Take 250 mg by mouth daily.     gabapentin   (NEURONTIN ) 300 MG capsule TAKE 1 CAPSULE BY MOUTH TWICE  DAILY 180 capsule 2   Melatonin 10 MG TABS Take by mouth.     METRONIDAZOLE , TOPICAL, 0.75 % LOTN Apply 1 application  topically daily as needed (rosacea). 59 mL 2   Multiple Vitamins-Minerals (MULTIVITAMIN WITH MINERALS) tablet Take 1 tablet by mouth daily.     Probiotic Product (ALIGN) 4 MG CAPS Take 1 capsule by mouth daily. 30 capsule 6   thyroid  (ARMOUR) 60 MG tablet Take 1 tablet by mouth daily. Hillary Blackwood     venlafaxine  XR (EFFEXOR -XR) 37.5 MG 24 hr capsule Take 1 capsule (37.5 mg total) by mouth daily with breakfast. 90 capsule 3   estradiol  (ESTRACE ) 1 MG tablet Take 1 tablet (1 mg total) by mouth daily. 90 tablet 3   B COMPLEX-C PO Take by mouth. (Patient not taking: Reported on 08/11/2023)     Biotin 89999 MCG TABS Take by mouth. (Patient not taking: Reported on 08/11/2023)     Ca & Phos-Vit D-Mag (CALCIUM) [phone] TABS Take by mouth. (Patient not taking: Reported on 08/11/2023)     Glucos-Chond-Hyal Ac-Ca Fructo (MOVE FREE JOINT HEALTH ADVANCE) TABS Take by mouth. (Patient not taking: Reported on 04/02/2024)     meloxicam  (MOBIC ) 15 MG tablet Take 1 tablet (15 mg total) by mouth daily as needed for pain. (Patient not taking: Reported on 08/11/2023) 30 tablet 2   NON FORMULARY      NON FORMULARY Balanced Babe (Mood Swing, Breast Health, and Bloating) (Patient not taking: Reported on 08/11/2023)     Rhubarb (ESTROVEN COMPLETE PO) Take by mouth. (Patient not taking: Reported on 08/11/2023)     Turmeric 500 MG CAPS Take by mouth. (Patient not taking: Reported on 08/11/2023)     No facility-administered medications prior to visit.      ROS:  Review of Systems see HPI    OBJECTIVE:   Vitals:  BP 125/77   Pulse 66   Ht 5' 7 (1.702 m)   Wt 194 lb 6.4 oz (88.2 kg)   LMP 12/12/2014   BMI 30.45 kg/m   Physical Exam Constitutional:      Appearance: Normal appearance.  Cardiovascular:     Rate and Rhythm:  Normal rate.  Pulmonary:     Effort: Pulmonary effort is normal.  Neurological:     Mental Status: She is alert.  Psychiatric:        Mood and Affect: Mood normal.  Thought Content: Thought content normal.     Results: No results found for this or any previous visit (from the past 24 hours).   Assessment/Plan: 1. Menopausal symptoms (Primary) - estradiol  (ESTRACE ) 1 MG tablet; Take 1 tablet (1 mg total) by mouth daily.  Dispense: 90 tablet; Refill: 3     Meds ordered this encounter  Medications   estradiol  (ESTRACE ) 1 MG tablet    Sig: Take 1 tablet (1 mg total) by mouth daily.    Dispense:  90 tablet    Refill:  3    Please send a replace/new response with 90-Day Supply if appropriate to maximize member benefit. Requesting 1 year supply.   Discussed the Electronic data systems, rec listening to the [employer] may remain on estradiol  for as long you like, each year we can revisit stopping or weaning, for now continue as prescribed.  Schedule annual exam    JINNIE HERO [facility], CNM 04/02/2024 5:23 PM

## 2024-04-02 NOTE — Progress Notes (Deleted)
 [facility]ABRA

## 2024-05-08 ENCOUNTER — Other Ambulatory Visit: Payer: Self-pay | Admitting: Family Medicine

## 2024-05-08 DIAGNOSIS — M4802 Spinal stenosis, cervical region: Secondary | ICD-10-CM

## 2024-05-08 DIAGNOSIS — M1811 Unilateral primary osteoarthritis of first carpometacarpal joint, right hand: Secondary | ICD-10-CM

## 2024-05-08 DIAGNOSIS — G8929 Other chronic pain: Secondary | ICD-10-CM

## 2024-05-11 NOTE — Telephone Encounter (Signed)
 Requested Prescriptions  Pending Prescriptions Disp Refills   gabapentin  (NEURONTIN ) 300 MG capsule [Pharmacy Med Name: GABAPENTIN  CAP 300MG  (NEUR)] 180 capsule 1    Sig: TAKE 1 CAPSULE BY MOUTH TWICE  DAILY     Neurology: Anticonvulsants - gabapentin  Passed - 05/11/2024 11:12 AM      Passed - Cr in normal range and within 360 days    Creat  Date Value Ref Range Status  08/13/2023 0.88 0.50 - 1.03 mg/dL Final         Passed - Completed PHQ-2 or PHQ-9 in the last 360 days      Passed - Valid encounter within last 12 months    Recent Outpatient Visits           9 months ago Annual physical exam   [place] [facility] [place], Marsa PARAS, DO

## 2024-06-04 ENCOUNTER — Ambulatory Visit (INDEPENDENT_AMBULATORY_CARE_PROVIDER_SITE_OTHER): Admitting: Family Medicine

## 2024-06-04 ENCOUNTER — Encounter: Payer: Self-pay | Admitting: Family Medicine

## 2024-06-04 VITALS — BP 124/82 | HR 67 | Temp 98.3°F | Ht 67.0 in | Wt 192.0 lb

## 2024-06-04 DIAGNOSIS — R6889 Other general symptoms and signs: Secondary | ICD-10-CM

## 2024-06-04 DIAGNOSIS — R590 Localized enlarged lymph nodes: Secondary | ICD-10-CM | POA: Diagnosis not present

## 2024-06-04 DIAGNOSIS — J0110 Acute frontal sinusitis, unspecified: Secondary | ICD-10-CM

## 2024-06-04 LAB — POCT INFLUENZA A/B
Influenza A, POC: NEGATIVE
Influenza B, POC: NEGATIVE

## 2024-06-04 MED ORDER — AMOXICILLIN-POT CLAVULANATE 875-125 MG PO TABS: 1.0000 | ORAL_TABLET | Freq: Two times a day (BID) | ORAL | 0 refills | Status: AC

## 2024-06-04 MED ORDER — IPRATROPIUM BROMIDE 0.06 % NA SOLN: 2.0000 | Freq: Four times a day (QID) | NASAL | 0 refills | Status: AC

## 2024-06-04 NOTE — Progress Notes (Signed)
 "  Subjective:    Patient ID: [name], female    DOB: Apr 20, 1966, 59 y.o.   MRN: [id number]  [name] is a 59 y.o. female presenting on 06/04/2024 for Cough and Sore Throat (Since Tuesday )  Patient presents for a same day appointment.  HPI Discussed the use of AI scribe software for clinical note transcription with the patient, who gave verbal consent to proceed.  History of Present Illness   [name] is a 59 year old female who presents with upper respiratory symptoms including sinus headache, sore throat, and congestion.  Sinus headache and nasal congestion - Severe sinus headache onset Tuesday 4 days ago - Congestion began Tuesday night and has persisted, worsening since onset - Sudafed 12-hour taken for sinus headache, typically effective but did not alleviate symptoms this time - Mucinex used for congestion - No improvement in symptoms with over-the-counter medications  Pharyngitis - Sore throat developed Tuesday night, described as 'a little touchy' - No loss of appetite, nausea, or generalized aches and pains  Cough and voice changes - Dry, nonproductive cough developed after initial symptoms - Voice affected, described as 'a little weird' and sometimes reduced to a whisper in the mornings  Fever and constitutional symptoms - Fever Tuesday night, initially felt very cold then woke up sweating - Temperature 98.57F the following morning  Lymphadenopathy - Tender knot in posterior cervical area, suspected to be a lymph node - Tender to touch and described as 'new'  Ear symptoms - Ringing in ears experienced on first night of illness  Lower respiratory symptoms - No wheezing or shortness of breath  Immunization status - Received influenza vaccine in late October 2025  Medication use - Sudafed, Mucinex, Tylenol , and ibuprofen  used for symptom relief  History of complications with upper respiratory illness - Previous illnesses have led to sinus  or ear infections         08/11/2023    2:13 PM 11/05/2022    1:43 PM 06/15/2021    2:15 PM  Depression screen PHQ 2/9  Decreased Interest 0 3 1  Down, Depressed, Hopeless 0 3 0  PHQ - 2 Score 0 6 1  Altered sleeping 3 3 3   Tired, decreased energy 1 3 3   Change in appetite 1 3 3   Feeling bad or failure about yourself  0 3 0  Trouble concentrating 1 3 3   Moving slowly or fidgety/restless 0 0 0  Suicidal thoughts 0 0 0  PHQ-9 Score 6  21  13    Difficult doing work/chores Somewhat difficult Extremely dIfficult Not difficult at all     Data saved with a previous flowsheet row definition       08/11/2023    2:14 PM 11/05/2022    1:43 PM 06/15/2021    2:15 PM 12/25/2020    3:54 PM  GAD 7 : Generalized Anxiety Score  Nervous, Anxious, on Edge 0  0  0  0   Control/stop worrying 0  3  0  0   Worry too much - different things 0  0  0  0   Trouble relaxing 0  0  1  0   Restless 0  2  0  0   Easily annoyed or irritable 0  0  1  0   Afraid - awful might happen 0  0  0  0   Total GAD 7 Score 0 5 2 0  Anxiety Difficulty  Not difficult at  all Not difficult at all Not difficult at all     Data saved with a previous flowsheet row definition    Social History[1]  Review of Systems  HENT:  Positive for voice change.        Hoarse voice   Per HPI unless specifically indicated above     Objective:    BP 124/82 (BP Location: Left Arm, Patient Position: Sitting, Cuff Size: Normal)   Pulse 67   Temp 98.3 F (36.8 C) (Oral)   Ht 5' 7 (1.702 m)   Wt 192 lb (87.1 kg)   LMP 12/12/2014   SpO2 99%   BMI 30.07 kg/m   Wt Readings from Last 3 Encounters:  06/04/24 192 lb (87.1 kg)  04/02/24 194 lb 6.4 oz (88.2 kg)  08/11/23 187 lb (84.8 kg)    Physical Exam Vitals and nursing note reviewed.  Constitutional:      General: She is not in acute distress.    Appearance: She is well-developed. She is not diaphoretic.     Comments: Well-appearing, comfortable, cooperative  HENT:      Head: Normocephalic and atraumatic.     Right Ear: Tympanic membrane, ear canal and external ear normal. There is no impacted cerumen.     Left Ear: Tympanic membrane, ear canal and external ear normal. There is no impacted cerumen.     Nose: Congestion present.     Mouth/Throat:     Mouth: Mucous membranes are moist.     Pharynx: No oropharyngeal exudate or posterior oropharyngeal erythema.  Eyes:     General:        Right eye: No discharge.        Left eye: No discharge.     Conjunctiva/sclera: Conjunctivae normal.  Neck:     Thyroid : No thyromegaly.  Cardiovascular:     Rate and Rhythm: Normal rate and regular rhythm.     Heart sounds: Normal heart sounds. No murmur heard. Pulmonary:     Effort: Pulmonary effort is normal. No respiratory distress.     Breath sounds: Normal breath sounds. No wheezing or rales.  Musculoskeletal:        General: Normal range of motion.     Cervical back: Normal range of motion and neck supple.  Lymphadenopathy:     Cervical: Cervical adenopathy (Left posterior cervical lymph node enlargement, possibly related to SCM lymph node) present.  Skin:    General: Skin is warm and dry.     Findings: No erythema or rash.  Neurological:     Mental Status: She is alert and oriented to person, place, and time.  Psychiatric:        Behavior: Behavior normal.     Comments: Well groomed, good eye contact, normal speech and thoughts     Results for orders placed or performed in visit on 06/04/24  POCT Influenza A/B   Collection Time: 06/04/24 11:30 AM  Result Value Ref Range   Influenza A, POC Negative Negative   Influenza B, POC Negative Negative      Assessment & Plan:   Problem List Items Addressed This Visit   None Visit Diagnoses       Acute non-recurrent frontal sinusitis    -  Primary   Relevant Medications   amoxicillin -clavulanate (AUGMENTIN) 875-125 MG tablet   ipratropium (ATROVENT ) 0.06 % nasal spray     Flu-like symptoms        Relevant Orders   POCT Influenza A/B (Completed)  LAD (lymphadenopathy), posterior cervical            Acute frontal sinusitis Onset 4 days. Likely viral etiology, but bacterial infection not ruled out.  Posterior cervical lymphadenopathy noted. Symptoms localized to deeper sinuses. No evidence of other bacterial source except possible early sinus infection. Flu Testing negative today  Start Atrovent  nasal spray decongestant 2 sprays in each nostril up to 4 times daily for 7 days OTC Supportive care - Recommended nasal saline for nasal clearance. - Encouraged use of Tylenol  and ibuprofen  for symptom relief.  If not improving in 24-48 hours will send rx as precaution - Prescribed Augmentin 875 mg twice daily for 10 days if symptoms worsen or persist.  Left Posterior Cervical LAD Likely reactive to sinusitis / URI - Advised monitoring lymph node; consider imaging if unresolved in 4 weeks.        Orders Placed This Encounter  Procedures   POCT Influenza A/B    Meds ordered this encounter  Medications   amoxicillin -clavulanate (AUGMENTIN) 875-125 MG tablet    Sig: Take 1 tablet by mouth 2 (two) times daily.    Dispense:  20 tablet    Refill:  0   ipratropium (ATROVENT ) 0.06 % nasal spray    Sig: Place 2 sprays into both nostrils 4 (four) times daily. For up to 5-7 days then stop.    Dispense:  15 mL    Refill:  0    Follow up plan: Return if symptoms worsen or fail to improve.  Marsa Officer, DO [facility] 06/04/2024, 11:39 AM     [1]  Social History Tobacco Use   Smoking status: Never   Smokeless tobacco: Never  Vaping Use   Vaping status: Never Used  Substance Use Topics   Alcohol use: No    Alcohol/week: 0.0 standard drinks of alcohol   Drug use: No   "

## 2024-06-04 NOTE — Patient Instructions (Addendum)
 Thank you for coming to the office today.  Flu test negative  Results for orders placed or performed in visit on 06/04/24 (from the past 24 hours)  POCT Influenza A/B     Status: None   Collection Time: 06/04/24 11:30 AM  Result Value Ref Range   Influenza A, POC Negative Negative   Influenza B, POC Negative Negative     1. It sounds like you have persistent Sinus Congestion or Rhinosinusitis - I do not think that this is a Bacterial Sinus Infection. Usually these are caused by Viruses or Allergies, and will run it's course in about 7 to 10 days.  However it MAY proceed to Sinus infection with bacterial concern. Today I don't see evidence of this but it certainly may progress  IF it does not improve or worsens within past 24-48 hours then - Start Augmentin 1 pill twice daily (breakfast and dinner, with food and plenty of water) for 10 days, complete entire course, do not stop early even if feeling better  Start Atrovent  nasal spray decongestant 2 sprays in each nostril up to 4 times daily for 7 days  - Recommend to keep using Nasal Saline spray multiple times a day to help flush out congestion and clear sinuses - Improve hydration by drinking plenty of clear fluids (water, gatorade) to reduce secretions and thin congestion - Congestion draining down throat can cause irritation. May try warm herbal tea with honey, cough drops - Can take Tylenol  or Ibuprofen  as needed for fevers - May continue over the counter cold medicine as you are, I would not use any decongestant or mucinex longer than 7 days.    Please schedule a Follow-up Appointment to: Return if symptoms worsen or fail to improve.  If you have any other questions or concerns, please feel free to call the office or send a message through MyChart. You may also schedule an earlier appointment if necessary.  Additionally, you may be receiving a survey about your experience at our office within a few days to 1 week by e-mail or  mail. We value your feedback.  Marsa Officer, DO [facility], [place]

## 2024-06-06 ENCOUNTER — Other Ambulatory Visit: Payer: Self-pay | Admitting: Family Medicine

## 2024-06-06 DIAGNOSIS — N951 Menopausal and female climacteric states: Secondary | ICD-10-CM

## 2024-06-08 ENCOUNTER — Other Ambulatory Visit: Payer: Self-pay | Admitting: Family Medicine

## 2024-06-08 DIAGNOSIS — N951 Menopausal and female climacteric states: Secondary | ICD-10-CM

## 2024-06-08 NOTE — Telephone Encounter (Signed)
 Requested Prescriptions  Refused Prescriptions Disp Refills   venlafaxine  XR (EFFEXOR -XR) 37.5 MG 24 hr capsule [Pharmacy Med Name: Venlafaxine  HCl ER 37.5 MG Oral Capsule Extended Release 24 Hour] 90 capsule 3    Sig: TAKE 1 CAPSULE BY MOUTH DAILY  WITH BREAKFAST     Psychiatry: Antidepressants - SNRI - desvenlafaxine & venlafaxine  Failed - 06/08/2024  8:15 AM      Failed - Valid encounter within last 6 months    Recent Outpatient Visits           4 days ago Acute non-recurrent frontal sinusitis   [place] [facility] [place] Marsa PARAS, DO   10 months ago Annual physical exam   [place] [facility] [place] Marsa PARAS, DO              Failed - Lipid Panel in normal range within the last 12 months    Cholesterol, Total  Date Value Ref Range Status  04/07/2019 169 100 - 199 mg/dL Final   Cholesterol  Date Value Ref Range Status  08/13/2023 184 <200 mg/dL Final   LDL Cholesterol (Calc)  Date Value Ref Range Status  08/13/2023 110 (H) mg/dL (calc) Final    Comment:    Reference range: <100 . Desirable range <100 mg/dL for primary prevention;   <70 mg/dL for patients with CHD or diabetic patients  with > or = 2 CHD risk factors. [facility] LDL-C is now calculated using the Martin-Hopkins  calculation, which is a validated novel method providing  better accuracy than the Friedewald equation in the  estimation of LDL-C.  Gladis APPLETHWAITE et al. SANDREA. 7986;689(80): 2061-2068  (http://education.[facility].com/faq/FAQ164)    HDL  Date Value Ref Range Status  08/13/2023 45 (L) > OR = 50 mg/dL Final  88/74/7979 48 >60 mg/dL Final   Triglycerides  Date Value Ref Range Status  08/13/2023 170 (H) <150 mg/dL Final         Passed - Cr in normal range and within 360 days    Creat  Date Value Ref Range Status  08/13/2023 0.88 0.50 - 1.03 mg/dL Final         Passed - Last BP in normal range    BP Readings from Last 1 Encounters:   06/04/24 124/82

## 2024-06-09 NOTE — Telephone Encounter (Signed)
 Too soon for refill.  Requested Prescriptions  Pending Prescriptions Disp Refills   venlafaxine  XR (EFFEXOR -XR) 37.5 MG 24 hr capsule [Pharmacy Med Name: Venlafaxine  HCl ER 37.5 MG Oral Capsule Extended Release 24 Hour] 90 capsule 3    Sig: TAKE 1 CAPSULE BY MOUTH DAILY  WITH BREAKFAST     Psychiatry: Antidepressants - SNRI - desvenlafaxine & venlafaxine  Failed - 06/09/2024  9:51 AM      Failed - Valid encounter within last 6 months    Recent Outpatient Visits           5 days ago Acute non-recurrent frontal sinusitis   [place] [facility] [place] Marsa PARAS, DO   10 months ago Annual physical exam   [place] [facility] [place] Marsa PARAS, DO              Failed - Lipid Panel in normal range within the last 12 months    Cholesterol, Total  Date Value Ref Range Status  04/07/2019 169 100 - 199 mg/dL Final   Cholesterol  Date Value Ref Range Status  08/13/2023 184 <200 mg/dL Final   LDL Cholesterol (Calc)  Date Value Ref Range Status  08/13/2023 110 (H) mg/dL (calc) Final    Comment:    Reference range: <100 . Desirable range <100 mg/dL for primary prevention;   <70 mg/dL for patients with CHD or diabetic patients  with > or = 2 CHD risk factors. [facility] LDL-C is now calculated using the Martin-Hopkins  calculation, which is a validated novel method providing  better accuracy than the Friedewald equation in the  estimation of LDL-C.  Gladis APPLETHWAITE et al. SANDREA. 7986;689(80): 2061-2068  (http://education.[facility].com/faq/FAQ164)    HDL  Date Value Ref Range Status  08/13/2023 45 (L) > OR = 50 mg/dL Final  88/74/7979 48 >60 mg/dL Final   Triglycerides  Date Value Ref Range Status  08/13/2023 170 (H) <150 mg/dL Final         Passed - Cr in normal range and within 360 days    Creat  Date Value Ref Range Status  08/13/2023 0.88 0.50 - 1.03 mg/dL Final         Passed - Last BP in normal range    BP Readings  from Last 1 Encounters:  06/04/24 124/82

## 2024-08-12 ENCOUNTER — Other Ambulatory Visit: Payer: Self-pay

## 2024-08-23 ENCOUNTER — Encounter: Payer: Self-pay | Admitting: Family Medicine
